# Patient Record
Sex: Male | Born: 1940 | Race: White | Hispanic: No | Marital: Married | Smoking: Former smoker
Health system: Southern US, Community
[De-identification: ages and names within clinical notes are randomized; demographics above are authoritative.]

## PROBLEM LIST (undated history)

## (undated) DIAGNOSIS — E7800 Pure hypercholesterolemia, unspecified: Secondary | ICD-10-CM

## (undated) DIAGNOSIS — I2510 Atherosclerotic heart disease of native coronary artery without angina pectoris: Secondary | ICD-10-CM

## (undated) DIAGNOSIS — K219 Gastro-esophageal reflux disease without esophagitis: Secondary | ICD-10-CM

## (undated) DIAGNOSIS — I219 Acute myocardial infarction, unspecified: Secondary | ICD-10-CM

## (undated) DIAGNOSIS — I4891 Unspecified atrial fibrillation: Secondary | ICD-10-CM

## (undated) DIAGNOSIS — E669 Obesity, unspecified: Secondary | ICD-10-CM

## (undated) DIAGNOSIS — E119 Type 2 diabetes mellitus without complications: Secondary | ICD-10-CM

## (undated) DIAGNOSIS — K5790 Diverticulosis of intestine, part unspecified, without perforation or abscess without bleeding: Secondary | ICD-10-CM

## (undated) HISTORY — DX: Acute myocardial infarction, unspecified: I21.9

## (undated) HISTORY — DX: Obesity, unspecified: E66.9

## (undated) HISTORY — DX: Gastro-esophageal reflux disease without esophagitis: K21.9

## (undated) HISTORY — DX: Diverticulosis of intestine, part unspecified, without perforation or abscess without bleeding: K57.90

## (undated) HISTORY — DX: Type 2 diabetes mellitus without complications: E11.9

## (undated) HISTORY — DX: Unspecified atrial fibrillation: I48.91

## (undated) HISTORY — DX: Atherosclerotic heart disease of native coronary artery without angina pectoris: I25.10

## (undated) HISTORY — DX: Pure hypercholesterolemia, unspecified: E78.00

## (undated) HISTORY — PX: CORONARY ARTERY BYPASS GRAFT: SHX141

---
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
## 1986-04-06 DIAGNOSIS — I219 Acute myocardial infarction, unspecified: Secondary | ICD-10-CM

## 1986-04-06 HISTORY — DX: Acute myocardial infarction, unspecified: I21.9

## 1999-01-05 ENCOUNTER — Encounter: Payer: Self-pay | Admitting: Internal Medicine

## 1999-01-05 ENCOUNTER — Encounter: Admission: RE | Admit: 1999-01-05 | Discharge: 1999-01-05 | Payer: Self-pay | Admitting: Internal Medicine

## 2005-07-21 ENCOUNTER — Encounter: Admission: RE | Admit: 2005-07-21 | Discharge: 2005-07-21 | Payer: Self-pay | Admitting: Gastroenterology

## 2010-10-21 ENCOUNTER — Inpatient Hospital Stay (HOSPITAL_BASED_OUTPATIENT_CLINIC_OR_DEPARTMENT_OTHER)
Admission: RE | Admit: 2010-10-21 | Discharge: 2010-10-21 | Disposition: A | Discharge status: Home / Self Care | Payer: Medicare Other | Source: Ambulatory Visit | Attending: Interventional Cardiology | Admitting: Interventional Cardiology

## 2010-10-21 DIAGNOSIS — I70219 Atherosclerosis of native arteries of extremities with intermittent claudication, unspecified extremity: Secondary | ICD-10-CM | POA: Insufficient documentation

## 2010-10-21 DIAGNOSIS — I2510 Atherosclerotic heart disease of native coronary artery without angina pectoris: Secondary | ICD-10-CM | POA: Insufficient documentation

## 2010-10-21 DIAGNOSIS — I209 Angina pectoris, unspecified: Secondary | ICD-10-CM | POA: Insufficient documentation

## 2010-10-24 ENCOUNTER — Encounter: Payer: Medicare Other | Admitting: Cardiothoracic Surgery

## 2010-10-25 ENCOUNTER — Institutional Professional Consult (permissible substitution) (INDEPENDENT_AMBULATORY_CARE_PROVIDER_SITE_OTHER): Payer: Medicare Other | Admitting: Cardiothoracic Surgery

## 2010-10-25 ENCOUNTER — Ambulatory Visit (HOSPITAL_COMMUNITY)
Admission: RE | Admit: 2010-10-25 | Discharge: 2010-10-25 | Disposition: A | Discharge status: Home / Self Care | Payer: Medicare Other | Source: Ambulatory Visit | Attending: Cardiothoracic Surgery | Admitting: Cardiothoracic Surgery

## 2010-10-25 ENCOUNTER — Encounter (HOSPITAL_COMMUNITY)
Admission: RE | Admit: 2010-10-25 | Discharge: 2010-10-25 | Disposition: A | Discharge status: Home / Self Care | Payer: Medicare Other | Source: Ambulatory Visit | Attending: Cardiothoracic Surgery | Admitting: Cardiothoracic Surgery

## 2010-10-25 ENCOUNTER — Encounter: Payer: Self-pay | Admitting: Cardiothoracic Surgery

## 2010-10-25 ENCOUNTER — Other Ambulatory Visit: Payer: Self-pay | Admitting: Cardiothoracic Surgery

## 2010-10-25 VITALS — BP 152/82 | HR 51 | Temp 98.0°F | Resp 18 | Ht 69.0 in | Wt 245.0 lb

## 2010-10-25 DIAGNOSIS — E785 Hyperlipidemia, unspecified: Secondary | ICD-10-CM | POA: Insufficient documentation

## 2010-10-25 DIAGNOSIS — I2510 Atherosclerotic heart disease of native coronary artery without angina pectoris: Secondary | ICD-10-CM | POA: Insufficient documentation

## 2010-10-25 DIAGNOSIS — E119 Type 2 diabetes mellitus without complications: Secondary | ICD-10-CM | POA: Insufficient documentation

## 2010-10-25 DIAGNOSIS — I480 Paroxysmal atrial fibrillation: Secondary | ICD-10-CM | POA: Insufficient documentation

## 2010-10-25 DIAGNOSIS — I252 Old myocardial infarction: Secondary | ICD-10-CM | POA: Insufficient documentation

## 2010-10-25 DIAGNOSIS — Z01810 Encounter for preprocedural cardiovascular examination: Secondary | ICD-10-CM | POA: Insufficient documentation

## 2010-10-25 DIAGNOSIS — I219 Acute myocardial infarction, unspecified: Secondary | ICD-10-CM

## 2010-10-25 DIAGNOSIS — I4891 Unspecified atrial fibrillation: Secondary | ICD-10-CM | POA: Insufficient documentation

## 2010-10-25 DIAGNOSIS — K5790 Diverticulosis of intestine, part unspecified, without perforation or abscess without bleeding: Secondary | ICD-10-CM | POA: Insufficient documentation

## 2010-10-25 DIAGNOSIS — E7800 Pure hypercholesterolemia, unspecified: Secondary | ICD-10-CM

## 2010-10-25 DIAGNOSIS — Z01812 Encounter for preprocedural laboratory examination: Secondary | ICD-10-CM | POA: Insufficient documentation

## 2010-10-25 DIAGNOSIS — K219 Gastro-esophageal reflux disease without esophagitis: Secondary | ICD-10-CM | POA: Insufficient documentation

## 2010-10-25 DIAGNOSIS — E669 Obesity, unspecified: Secondary | ICD-10-CM | POA: Insufficient documentation

## 2010-10-25 DIAGNOSIS — I10 Essential (primary) hypertension: Secondary | ICD-10-CM | POA: Insufficient documentation

## 2010-10-25 LAB — BLOOD GAS, ARTERIAL
Acid-base deficit: 0.3 mmol/L (ref 0.0–2.0)
Bicarbonate: 23.8 meq/L (ref 20.0–24.0)
Drawn by: 344351
FIO2: 0.21 %
O2 Saturation: 93.1 %
Patient temperature: 98.6
TCO2: 25 mmol/L (ref 0–100)
pCO2 arterial: 38.7 mmHg (ref 35.0–45.0)
pH, Arterial: 7.406 (ref 7.350–7.450)
pO2, Arterial: 63.1 mmHg — ABNORMAL LOW (ref 80.0–100.0)

## 2010-10-25 LAB — URINALYSIS, ROUTINE W REFLEX MICROSCOPIC
Bilirubin Urine: NEGATIVE
Glucose, UA: NEGATIVE mg/dL
Hgb urine dipstick: NEGATIVE
Ketones, ur: NEGATIVE mg/dL
Leukocytes, UA: NEGATIVE
Nitrite: NEGATIVE
Protein, ur: NEGATIVE mg/dL
Specific Gravity, Urine: 1.01 (ref 1.005–1.030)
Urobilinogen, UA: 0.2 mg/dL (ref 0.0–1.0)
pH: 5.5 (ref 5.0–8.0)

## 2010-10-25 LAB — COMPREHENSIVE METABOLIC PANEL
ALT: 30 U/L (ref 0–53)
AST: 24 U/L (ref 0–37)
Albumin: 4.5 g/dL (ref 3.5–5.2)
Alkaline Phosphatase: 67 U/L (ref 39–117)
BUN: 17 mg/dL (ref 6–23)
CO2: 27 mEq/L (ref 19–32)
Calcium: 9.9 mg/dL (ref 8.4–10.5)
Chloride: 102 mEq/L (ref 96–112)
Creatinine, Ser: 1.13 mg/dL (ref 0.50–1.35)
GFR calc Af Amer: >60 mL/min (ref >60)
GFR calc non Af Amer: >60 mL/min (ref >60)
Glucose, Bld: 120 mg/dL — ABNORMAL HIGH (ref 70–99)
Potassium: 4.2 mEq/L (ref 3.5–5.1)
Sodium: 139 mEq/L (ref 135–145)
Total Bilirubin: 0.4 mg/dL (ref 0.3–1.2)
Total Protein: 7.3 g/dL (ref 6.0–8.3)

## 2010-10-25 LAB — CBC
HCT: 42.2 % (ref 39.0–52.0)
Hemoglobin: 15.4 g/dL (ref 13.0–17.0)
MCH: 32 pg (ref 26.0–34.0)
MCHC: 36.5 g/dL — ABNORMAL HIGH (ref 30.0–36.0)
MCV: 87.7 fL (ref 78.0–100.0)
Platelets: 187 K/uL (ref 150–400)
RBC: 4.81 MIL/uL (ref 4.22–5.81)
RDW: 12.9 % (ref 11.5–15.5)
WBC: 4.8 K/uL (ref 4.0–10.5)

## 2010-10-25 LAB — TYPE AND SCREEN
ABO/RH(D): O POS
Antibody Screen: NEGATIVE

## 2010-10-25 LAB — ABO/RH: ABO/RH(D): O POS

## 2010-10-25 LAB — SURGICAL PCR SCREEN
MRSA, PCR: NEGATIVE
Staphylococcus aureus: NEGATIVE

## 2010-10-25 LAB — APTT: aPTT: 36 seconds (ref 24–37)

## 2010-10-25 LAB — PROTIME-INR
INR: 1.26 (ref 0.00–1.49)
Prothrombin Time: 16.1 s — ABNORMAL HIGH (ref 11.6–15.2)

## 2010-10-25 NOTE — H&P (Signed)
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 Verdis Prime. Patient has a history of previous myocardial infarction in 1988 which was treated medically and resulted in hypokinesia of the LV apex and inferior wall. He has done well with medical therapy until recently when he has developed exertional chest pain and shortness of breath as well as claudication of his  bilateral calf and buttock. A myocardial perfusion stress test performed by  Dr Katrinka Blazing was abnormal showing ischemia-scar in the apex and inferior wall.  A subsequent lower extremity Doppler study revealed bilateral ABI of 0.8. A left heart catheter was performed 5 days previously by Dr. Katrinka Blazing showing an 80% left main stenosis with high-grade LAD diagonal disease, high-grade stenosis of the OM one origin of the circumflex, and distal RCA stenosis with ejection fraction of 45%. Based on the patient's symptoms stress test and coronary anatomy is felt to be candidate for surgical coronary revascularization.  PAST MEDICAL HISTORY: type 2 diabetes mellitus treated with oral agent, hypertension, dyslipidemia, history of paroxysmal atrial fibrillation on Coumadin for approximately 6 months, obesity, GERD, peripheral vessel disease with claudication of bilateral lower to gravity,  SOCIAL HISTORY:  He quit smoking 5 years ago. He does not drink alcohol. He is retired and married with his wife. He is a fairly sedentary lifestyle.  MEDICATIONS: HCTZ 25 mg daily warfarin 5 mg daily metformin 500 mg daily Diovan 320 one half tablet daily felodipine 10 mg by mouth daily simvastatin 80 mg daily atenolol 25 mg daily tramadol 50 mg every 6 hours when necessary  ALLERGIES no known drug  allergies  REVIEW OF SYSTEMS  constitutional review is negative for fever or weight loss. ENT review is negative for difficulty swallowing or active dental complaint. The rest review is negative wrist or thoracic trauma or abnormal chest x-ray, pulmonary nodules, no recent symptoms of upper respiratory infection. Cardiac is positive for his coronary disease negative for history of valvular disease positive for history of myocardial infarction positive for history of transient paroxysmal atrial fibrillation now in sinus rhythm on medications and Coumadin. GI review is positive for GERD negative for hepatitis jaundice or blood per rectum positive for diverticular disease and sigmoid colon noted on previous barium and a previous positive for diabetes mellitus negative for thyroid disease, hemoglobin A1c is pending. Past review is negative for DVT positive for bilateral lower leg claudication with probable peripheral vascular disease at the ileoanal femoral level. Neurologic review is negative for history of mini stroke, TIA, a carotid Doppler ultrasound study is pending.  Marland Kitchen PHYSICAL EXAM:  VITAL SIGN space blood pressure 148/88 pulse 60 regular respirations 18 temperature afebrile GENERAL APPEARANCE a well-developed middle-aged Caucasian male accompanied by his wife in no acute distress HEENT normocephalic pupils are equal and dentition is poor with cerebral missing teeth. NECK is supple without JVD mass or carotid bruit. CHEST no tenderness no deformity clear breath sounds bilaterally HEART regular rhythm without S3 gallop murmur or friction rub. ABDOMEN soft nontender without pulsatile mass active bowel sounds no organomegaly EXTREMITIES no clubbing cyanosis edema or tenderness VASCULAR nonpalpable pedal pulses 2+ palpable radial pulses no evidence of venous insufficiency of the lower extremities feet are warm and pink despite the absence of pedal pulses. NEUROLOGIC no focal motor  weakness her and  oriented.  IMPRESSION PLAN  This is a very nice 70 year old gentleman who has exertional angina with severe coronary disease and left main stenosis documented by cardiac catheterization. He would benefit from multivessel bypass grafting targets plan to the LAD, circumflex, and distal RCA. Schedule surgery for August 23 at [facility] and obtained preoperative Doppler carotid studies and cautery function tests. Discussed the indications benefits alternatives and risks of coronary artery bypass grafting for for treatment of his coronary artery disease. He understands the risk of bleeding, potentially require, infection, ventilator dependence, renal failure, MI, stroke, and death. After reviewing these issues she demonstrated his understanding and agrees to proceed with surgery under what I feel is an informed consent.

## 2010-10-26 LAB — HEMOGLOBIN A1C
Hgb A1c MFr Bld: 6.7 % — ABNORMAL HIGH (ref <5.7)
Mean Plasma Glucose: 146 mg/dL — ABNORMAL HIGH (ref <117)

## 2010-10-27 ENCOUNTER — Inpatient Hospital Stay (HOSPITAL_COMMUNITY)
Admission: RE | Admit: 2010-10-27 | Discharge: 2010-11-04 | DRG: 235 | Disposition: A | Discharge status: Skilled Nursing Facility | Payer: Medicare Other | Source: Ambulatory Visit | Attending: Cardiothoracic Surgery | Admitting: Cardiothoracic Surgery

## 2010-10-27 ENCOUNTER — Inpatient Hospital Stay (HOSPITAL_COMMUNITY): Payer: Medicare Other

## 2010-10-27 DIAGNOSIS — I2510 Atherosclerotic heart disease of native coronary artery without angina pectoris: Principal | ICD-10-CM | POA: Diagnosis present

## 2010-10-27 DIAGNOSIS — I4891 Unspecified atrial fibrillation: Secondary | ICD-10-CM | POA: Diagnosis not present

## 2010-10-27 DIAGNOSIS — I252 Old myocardial infarction: Secondary | ICD-10-CM

## 2010-10-27 DIAGNOSIS — I509 Heart failure, unspecified: Secondary | ICD-10-CM | POA: Diagnosis present

## 2010-10-27 DIAGNOSIS — Z7982 Long term (current) use of aspirin: Secondary | ICD-10-CM

## 2010-10-27 DIAGNOSIS — Z87891 Personal history of nicotine dependence: Secondary | ICD-10-CM

## 2010-10-27 DIAGNOSIS — I5043 Acute on chronic combined systolic (congestive) and diastolic (congestive) heart failure: Secondary | ICD-10-CM | POA: Diagnosis present

## 2010-10-27 DIAGNOSIS — E785 Hyperlipidemia, unspecified: Secondary | ICD-10-CM | POA: Diagnosis present

## 2010-10-27 DIAGNOSIS — I10 Essential (primary) hypertension: Secondary | ICD-10-CM | POA: Diagnosis present

## 2010-10-27 DIAGNOSIS — E8779 Other fluid overload: Secondary | ICD-10-CM | POA: Diagnosis not present

## 2010-10-27 DIAGNOSIS — Z7901 Long term (current) use of anticoagulants: Secondary | ICD-10-CM

## 2010-10-27 DIAGNOSIS — Z79899 Other long term (current) drug therapy: Secondary | ICD-10-CM

## 2010-10-27 DIAGNOSIS — E119 Type 2 diabetes mellitus without complications: Secondary | ICD-10-CM | POA: Diagnosis present

## 2010-10-27 DIAGNOSIS — I495 Sick sinus syndrome: Secondary | ICD-10-CM | POA: Diagnosis not present

## 2010-10-27 DIAGNOSIS — I739 Peripheral vascular disease, unspecified: Secondary | ICD-10-CM | POA: Diagnosis present

## 2010-10-27 DIAGNOSIS — D62 Acute posthemorrhagic anemia: Secondary | ICD-10-CM | POA: Diagnosis not present

## 2010-10-27 HISTORY — PX: OTHER SURGICAL HISTORY: SHX169

## 2010-10-27 LAB — CREATININE, SERUM
Creatinine, Ser: 0.76 mg/dL (ref 0.50–1.35)
GFR calc Af Amer: >60 mL/min (ref >60)
GFR calc non Af Amer: >60 mL/min (ref >60)

## 2010-10-27 LAB — POCT I-STAT 3, ART BLOOD GAS (G3+)
Acid-base deficit: 2 mmol/L (ref 0.0–2.0)
Acid-base deficit: 3 mmol/L — ABNORMAL HIGH (ref 0.0–2.0)
Acid-base deficit: 3 mmol/L — ABNORMAL HIGH (ref 0.0–2.0)
Acid-base deficit: 2 mmol/L (ref 0.0–2.0)
Acid-base deficit: 3 mmol/L — ABNORMAL HIGH (ref 0.0–2.0)
Bicarbonate: 23.2 mEq/L (ref 20.0–24.0)
Bicarbonate: 21.7 mEq/L (ref 20.0–24.0)
Bicarbonate: 22.9 mEq/L (ref 20.0–24.0)
Bicarbonate: 23.9 mEq/L (ref 20.0–24.0)
Bicarbonate: 23.1 mEq/L (ref 20.0–24.0)
O2 Saturation: 100 %
O2 Saturation: 100 %
O2 Saturation: 92 %
O2 Saturation: 94 %
O2 Saturation: 91 %
Patient temperature: 35.4
Patient temperature: 37
Patient temperature: 37.7
TCO2: 24 mmol/L (ref 0–100)
TCO2: 23 mmol/L (ref 0–100)
TCO2: 24 mmol/L (ref 0–100)
TCO2: 25 mmol/L (ref 0–100)
TCO2: 24 mmol/L (ref 0–100)
pCO2 arterial: 41.9 mmHg (ref 35.0–45.0)
pCO2 arterial: 37.4 mmHg (ref 35.0–45.0)
pCO2 arterial: 41.7 mmHg (ref 35.0–45.0)
pCO2 arterial: 45.7 mmHg — ABNORMAL HIGH (ref 35.0–45.0)
pCO2 arterial: 43.6 mmHg (ref 35.0–45.0)
pH, Arterial: 7.352 (ref 7.350–7.450)
pH, Arterial: 7.372 (ref 7.350–7.450)
pH, Arterial: 7.34 — ABNORMAL LOW (ref 7.350–7.450)
pH, Arterial: 7.326 — ABNORMAL LOW (ref 7.350–7.450)
pH, Arterial: 7.336 — ABNORMAL LOW (ref 7.350–7.450)
pO2, Arterial: 342 mmHg — ABNORMAL HIGH (ref 80.0–100.0)
pO2, Arterial: 250 mmHg — ABNORMAL HIGH (ref 80.0–100.0)
pO2, Arterial: 62 mmHg — ABNORMAL LOW (ref 80.0–100.0)
pO2, Arterial: 79 mmHg — ABNORMAL LOW (ref 80.0–100.0)
pO2, Arterial: 68 mmHg — ABNORMAL LOW (ref 80.0–100.0)

## 2010-10-27 LAB — POCT I-STAT 4, (NA,K, GLUC, HGB,HCT)
Glucose, Bld: 148 mg/dL — ABNORMAL HIGH (ref 70–99)
Glucose, Bld: 146 mg/dL — ABNORMAL HIGH (ref 70–99)
Glucose, Bld: 161 mg/dL — ABNORMAL HIGH (ref 70–99)
Glucose, Bld: 144 mg/dL — ABNORMAL HIGH (ref 70–99)
Glucose, Bld: 175 mg/dL — ABNORMAL HIGH (ref 70–99)
Glucose, Bld: 133 mg/dL — ABNORMAL HIGH (ref 70–99)
HCT: 40 % (ref 39.0–52.0)
HCT: 27 % — ABNORMAL LOW (ref 39.0–52.0)
HCT: 35 % — ABNORMAL LOW (ref 39.0–52.0)
HCT: 28 % — ABNORMAL LOW (ref 39.0–52.0)
HCT: 32 % — ABNORMAL LOW (ref 39.0–52.0)
HCT: 33 % — ABNORMAL LOW (ref 39.0–52.0)
Hemoglobin: 13.6 g/dL (ref 13.0–17.0)
Hemoglobin: 11.9 g/dL — ABNORMAL LOW (ref 13.0–17.0)
Hemoglobin: 9.2 g/dL — ABNORMAL LOW (ref 13.0–17.0)
Hemoglobin: 9.5 g/dL — ABNORMAL LOW (ref 13.0–17.0)
Hemoglobin: 10.9 g/dL — ABNORMAL LOW (ref 13.0–17.0)
Hemoglobin: 11.2 g/dL — ABNORMAL LOW (ref 13.0–17.0)
Potassium: 3.8 mEq/L (ref 3.5–5.1)
Potassium: 3.6 mEq/L (ref 3.5–5.1)
Potassium: 3.7 mEq/L (ref 3.5–5.1)
Potassium: 4.4 mEq/L (ref 3.5–5.1)
Potassium: 3.9 mEq/L (ref 3.5–5.1)
Potassium: 3.4 mEq/L — ABNORMAL LOW (ref 3.5–5.1)
Sodium: 140 mEq/L (ref 135–145)
Sodium: 136 mEq/L (ref 135–145)
Sodium: 140 mEq/L (ref 135–145)
Sodium: 137 mEq/L (ref 135–145)
Sodium: 139 mEq/L (ref 135–145)
Sodium: 139 mEq/L (ref 135–145)

## 2010-10-27 LAB — CBC
HCT: 32.6 % — ABNORMAL LOW (ref 39.0–52.0)
HCT: 34.8 % — ABNORMAL LOW (ref 39.0–52.0)
Hemoglobin: 11.4 g/dL — ABNORMAL LOW (ref 13.0–17.0)
Hemoglobin: 12.3 g/dL — ABNORMAL LOW (ref 13.0–17.0)
MCH: 30.3 pg (ref 26.0–34.0)
MCH: 30.6 pg (ref 26.0–34.0)
MCHC: 35 g/dL (ref 30.0–36.0)
MCHC: 35.3 g/dL (ref 30.0–36.0)
MCV: 86.7 fL (ref 78.0–100.0)
MCV: 86.6 fL (ref 78.0–100.0)
Platelets: 116 10*3/uL — ABNORMAL LOW (ref 150–400)
Platelets: 137 10*3/uL — ABNORMAL LOW (ref 150–400)
RBC: 3.76 MIL/uL — ABNORMAL LOW (ref 4.22–5.81)
RBC: 4.02 MIL/uL — ABNORMAL LOW (ref 4.22–5.81)
RDW: 12.8 % (ref 11.5–15.5)
RDW: 12.8 % (ref 11.5–15.5)
WBC: 9.9 10*3/uL (ref 4.0–10.5)
WBC: 9.2 10*3/uL (ref 4.0–10.5)

## 2010-10-27 LAB — POCT I-STAT, CHEM 8
BUN: 12 mg/dL (ref 6–23)
Calcium, Ion: 1.13 mmol/L (ref 1.12–1.32)
Chloride: 106 mEq/L (ref 96–112)
Creatinine, Ser: 0.9 mg/dL (ref 0.50–1.35)
Glucose, Bld: 126 mg/dL — ABNORMAL HIGH (ref 70–99)
HCT: 36 % — ABNORMAL LOW (ref 39.0–52.0)
Hemoglobin: 12.2 g/dL — ABNORMAL LOW (ref 13.0–17.0)
Potassium: 4.3 mEq/L (ref 3.5–5.1)
Sodium: 141 mEq/L (ref 135–145)
TCO2: 22 mmol/L (ref 0–100)

## 2010-10-27 LAB — POCT ACTIVATED CLOTTING TIME
Activated Clotting Time: 149 seconds
Activated Clotting Time: 507 seconds
Activated Clotting Time: 375 seconds
Activated Clotting Time: 133 seconds

## 2010-10-27 LAB — APTT: aPTT: 38 seconds — ABNORMAL HIGH (ref 24–37)

## 2010-10-27 LAB — HEMOGLOBIN AND HEMATOCRIT, BLOOD
HCT: 28 % — ABNORMAL LOW (ref 39.0–52.0)
Hemoglobin: 9.8 g/dL — ABNORMAL LOW (ref 13.0–17.0)

## 2010-10-27 LAB — GLUCOSE, CAPILLARY: Glucose-Capillary: 160 mg/dL — ABNORMAL HIGH (ref 70–99)

## 2010-10-27 LAB — PROTIME-INR
INR: 1.66 — ABNORMAL HIGH (ref 0.00–1.49)
Prothrombin Time: 19.9 seconds — ABNORMAL HIGH (ref 11.6–15.2)

## 2010-10-27 LAB — MAGNESIUM: Magnesium: 2.6 mg/dL — ABNORMAL HIGH (ref 1.5–2.5)

## 2010-10-27 LAB — PLATELET COUNT: Platelets: 114 10*3/uL — ABNORMAL LOW (ref 150–400)

## 2010-10-28 ENCOUNTER — Inpatient Hospital Stay (HOSPITAL_COMMUNITY): Payer: Medicare Other

## 2010-10-28 DIAGNOSIS — E1165 Type 2 diabetes mellitus with hyperglycemia: Secondary | ICD-10-CM

## 2010-10-28 DIAGNOSIS — IMO0001 Reserved for inherently not codable concepts without codable children: Secondary | ICD-10-CM

## 2010-10-28 LAB — GLUCOSE, CAPILLARY
Glucose-Capillary: 137 mg/dL — ABNORMAL HIGH (ref 70–99)
Glucose-Capillary: 147 mg/dL — ABNORMAL HIGH (ref 70–99)
Glucose-Capillary: 116 mg/dL — ABNORMAL HIGH (ref 70–99)
Glucose-Capillary: 100 mg/dL — ABNORMAL HIGH (ref 70–99)
Glucose-Capillary: 101 mg/dL — ABNORMAL HIGH (ref 70–99)
Glucose-Capillary: 104 mg/dL — ABNORMAL HIGH (ref 70–99)
Glucose-Capillary: 104 mg/dL — ABNORMAL HIGH (ref 70–99)
Glucose-Capillary: 108 mg/dL — ABNORMAL HIGH (ref 70–99)
Glucose-Capillary: 111 mg/dL — ABNORMAL HIGH (ref 70–99)
Glucose-Capillary: 120 mg/dL — ABNORMAL HIGH (ref 70–99)
Glucose-Capillary: 129 mg/dL — ABNORMAL HIGH (ref 70–99)
Glucose-Capillary: 131 mg/dL — ABNORMAL HIGH (ref 70–99)
Glucose-Capillary: 142 mg/dL — ABNORMAL HIGH (ref 70–99)
Glucose-Capillary: 83 mg/dL (ref 70–99)
Glucose-Capillary: 85 mg/dL (ref 70–99)
Glucose-Capillary: 93 mg/dL (ref 70–99)
Glucose-Capillary: 149 mg/dL — ABNORMAL HIGH (ref 70–99)
Glucose-Capillary: 143 mg/dL — ABNORMAL HIGH (ref 70–99)
Glucose-Capillary: 128 mg/dL — ABNORMAL HIGH (ref 70–99)
Glucose-Capillary: 128 mg/dL — ABNORMAL HIGH (ref 70–99)
Glucose-Capillary: 137 mg/dL — ABNORMAL HIGH (ref 70–99)

## 2010-10-28 LAB — BASIC METABOLIC PANEL
BUN: 14 mg/dL (ref 6–23)
CO2: 25 mEq/L (ref 19–32)
Calcium: 8.2 mg/dL — ABNORMAL LOW (ref 8.4–10.5)
Chloride: 109 mEq/L (ref 96–112)
Creatinine, Ser: 0.9 mg/dL (ref 0.50–1.35)
GFR calc Af Amer: >60 mL/min (ref >60)
GFR calc non Af Amer: >60 mL/min (ref >60)
Glucose, Bld: 105 mg/dL — ABNORMAL HIGH (ref 70–99)
Potassium: 3.9 mEq/L (ref 3.5–5.1)
Sodium: 140 mEq/L (ref 135–145)

## 2010-10-28 LAB — CREATININE, SERUM
Creatinine, Ser: 1.32 mg/dL (ref 0.50–1.35)
GFR calc Af Amer: >60 mL/min (ref >60)
GFR calc non Af Amer: 53 mL/min — ABNORMAL LOW (ref >60)

## 2010-10-28 LAB — CBC
HCT: 34.3 % — ABNORMAL LOW (ref 39.0–52.0)
HCT: 35.2 % — ABNORMAL LOW (ref 39.0–52.0)
Hemoglobin: 12 g/dL — ABNORMAL LOW (ref 13.0–17.0)
Hemoglobin: 12 g/dL — ABNORMAL LOW (ref 13.0–17.0)
MCH: 30.5 pg (ref 26.0–34.0)
MCH: 30.4 pg (ref 26.0–34.0)
MCHC: 35 g/dL (ref 30.0–36.0)
MCHC: 34.1 g/dL (ref 30.0–36.0)
MCV: 87.3 fL (ref 78.0–100.0)
MCV: 89.1 fL (ref 78.0–100.0)
Platelets: 133 10*3/uL — ABNORMAL LOW (ref 150–400)
Platelets: 150 10*3/uL (ref 150–400)
RBC: 3.93 MIL/uL — ABNORMAL LOW (ref 4.22–5.81)
RBC: 3.95 MIL/uL — ABNORMAL LOW (ref 4.22–5.81)
RDW: 13 % (ref 11.5–15.5)
RDW: 13.3 % (ref 11.5–15.5)
WBC: 9.8 10*3/uL (ref 4.0–10.5)
WBC: 11.8 10*3/uL — ABNORMAL HIGH (ref 4.0–10.5)

## 2010-10-28 LAB — MAGNESIUM
Magnesium: 2.5 mg/dL (ref 1.5–2.5)
Magnesium: 2.5 mg/dL (ref 1.5–2.5)

## 2010-10-28 LAB — POCT I-STAT, CHEM 8
BUN: 21 mg/dL (ref 6–23)
Calcium, Ion: 1.12 mmol/L (ref 1.12–1.32)
Chloride: 102 mEq/L (ref 96–112)
Creatinine, Ser: 1.5 mg/dL — ABNORMAL HIGH (ref 0.50–1.35)
Glucose, Bld: 168 mg/dL — ABNORMAL HIGH (ref 70–99)
HCT: 36 % — ABNORMAL LOW (ref 39.0–52.0)
Hemoglobin: 12.2 g/dL — ABNORMAL LOW (ref 13.0–17.0)
Potassium: 4.2 mEq/L (ref 3.5–5.1)
Sodium: 138 mEq/L (ref 135–145)
TCO2: 22 mmol/L (ref 0–100)

## 2010-10-29 ENCOUNTER — Inpatient Hospital Stay (HOSPITAL_COMMUNITY): Payer: Medicare Other

## 2010-10-29 LAB — GLUCOSE, CAPILLARY
Glucose-Capillary: 147 mg/dL — ABNORMAL HIGH (ref 70–99)
Glucose-Capillary: 160 mg/dL — ABNORMAL HIGH (ref 70–99)
Glucose-Capillary: 166 mg/dL — ABNORMAL HIGH (ref 70–99)
Glucose-Capillary: 166 mg/dL — ABNORMAL HIGH (ref 70–99)
Glucose-Capillary: 181 mg/dL — ABNORMAL HIGH (ref 70–99)
Glucose-Capillary: 208 mg/dL — ABNORMAL HIGH (ref 70–99)

## 2010-10-29 LAB — BASIC METABOLIC PANEL
BUN: 21 mg/dL (ref 6–23)
CO2: 25 mEq/L (ref 19–32)
Calcium: 8.2 mg/dL — ABNORMAL LOW (ref 8.4–10.5)
Chloride: 100 mEq/L (ref 96–112)
Creatinine, Ser: 1.13 mg/dL (ref 0.50–1.35)
GFR calc Af Amer: >60 mL/min (ref >60)
GFR calc non Af Amer: >60 mL/min (ref >60)
Glucose, Bld: 177 mg/dL — ABNORMAL HIGH (ref 70–99)
Potassium: 4.1 mEq/L (ref 3.5–5.1)
Sodium: 138 mEq/L (ref 135–145)

## 2010-10-29 LAB — CBC
HCT: 34.5 % — ABNORMAL LOW (ref 39.0–52.0)
Hemoglobin: 11.7 g/dL — ABNORMAL LOW (ref 13.0–17.0)
MCH: 30.2 pg (ref 26.0–34.0)
MCHC: 33.9 g/dL (ref 30.0–36.0)
MCV: 89.1 fL (ref 78.0–100.0)
Platelets: 140 10*3/uL — ABNORMAL LOW (ref 150–400)
RBC: 3.87 MIL/uL — ABNORMAL LOW (ref 4.22–5.81)
RDW: 13.4 % (ref 11.5–15.5)
WBC: 10.7 10*3/uL — ABNORMAL HIGH (ref 4.0–10.5)

## 2010-10-29 LAB — HEPATIC FUNCTION PANEL
ALT: 17 U/L (ref 0–53)
AST: 28 U/L (ref 0–37)
Albumin: 3.3 g/dL — ABNORMAL LOW (ref 3.5–5.2)
Alkaline Phosphatase: 44 U/L (ref 39–117)
Bilirubin, Direct: 0.2 mg/dL (ref 0.0–0.3)
Indirect Bilirubin: 0.5 mg/dL (ref 0.3–0.9)
Total Bilirubin: 0.7 mg/dL (ref 0.3–1.2)
Total Protein: 6 g/dL (ref 6.0–8.3)

## 2010-10-29 LAB — PROTIME-INR
INR: 1.31 (ref 0.00–1.49)
Prothrombin Time: 16.5 seconds — ABNORMAL HIGH (ref 11.6–15.2)

## 2010-10-29 LAB — TSH: TSH: 2.144 u[IU]/mL (ref 0.350–4.500)

## 2010-10-29 LAB — MAGNESIUM: Magnesium: 2.2 mg/dL (ref 1.5–2.5)

## 2010-10-30 ENCOUNTER — Inpatient Hospital Stay (HOSPITAL_COMMUNITY): Payer: Medicare Other

## 2010-10-30 LAB — CBC
HCT: 32.6 % — ABNORMAL LOW (ref 39.0–52.0)
Hemoglobin: 11.1 g/dL — ABNORMAL LOW (ref 13.0–17.0)
MCH: 29.7 pg (ref 26.0–34.0)
MCHC: 34 g/dL (ref 30.0–36.0)
MCV: 87.2 fL (ref 78.0–100.0)
Platelets: 147 10*3/uL — ABNORMAL LOW (ref 150–400)
RBC: 3.74 MIL/uL — ABNORMAL LOW (ref 4.22–5.81)
RDW: 13.1 % (ref 11.5–15.5)
WBC: 8.9 10*3/uL (ref 4.0–10.5)

## 2010-10-30 LAB — GLUCOSE, CAPILLARY
Glucose-Capillary: 150 mg/dL — ABNORMAL HIGH (ref 70–99)
Glucose-Capillary: 151 mg/dL — ABNORMAL HIGH (ref 70–99)
Glucose-Capillary: 167 mg/dL — ABNORMAL HIGH (ref 70–99)
Glucose-Capillary: 179 mg/dL — ABNORMAL HIGH (ref 70–99)
Glucose-Capillary: 188 mg/dL — ABNORMAL HIGH (ref 70–99)

## 2010-10-30 LAB — BASIC METABOLIC PANEL
BUN: 24 mg/dL — ABNORMAL HIGH (ref 6–23)
CO2: 26 mEq/L (ref 19–32)
Calcium: 8.5 mg/dL (ref 8.4–10.5)
Chloride: 99 mEq/L (ref 96–112)
Creatinine, Ser: 1.01 mg/dL (ref 0.50–1.35)
GFR calc Af Amer: >60 mL/min (ref >60)
GFR calc non Af Amer: >60 mL/min (ref >60)
Glucose, Bld: 159 mg/dL — ABNORMAL HIGH (ref 70–99)
Potassium: 3.9 mEq/L (ref 3.5–5.1)
Sodium: 135 mEq/L (ref 135–145)

## 2010-10-31 ENCOUNTER — Inpatient Hospital Stay (HOSPITAL_COMMUNITY): Payer: Medicare Other

## 2010-10-31 DIAGNOSIS — E1165 Type 2 diabetes mellitus with hyperglycemia: Secondary | ICD-10-CM

## 2010-10-31 DIAGNOSIS — IMO0001 Reserved for inherently not codable concepts without codable children: Secondary | ICD-10-CM

## 2010-10-31 LAB — BASIC METABOLIC PANEL
BUN: 38 mg/dL — ABNORMAL HIGH (ref 6–23)
CO2: 26 mEq/L (ref 19–32)
Calcium: 8.6 mg/dL (ref 8.4–10.5)
Chloride: 97 mEq/L (ref 96–112)
Creatinine, Ser: 1.28 mg/dL (ref 0.50–1.35)
GFR calc Af Amer: >60 mL/min (ref >60)
GFR calc non Af Amer: 55 mL/min — ABNORMAL LOW (ref >60)
Glucose, Bld: 128 mg/dL — ABNORMAL HIGH (ref 70–99)
Potassium: 3.9 mEq/L (ref 3.5–5.1)
Sodium: 132 mEq/L — ABNORMAL LOW (ref 135–145)

## 2010-10-31 LAB — CBC
HCT: 33.3 % — ABNORMAL LOW (ref 39.0–52.0)
Hemoglobin: 11.4 g/dL — ABNORMAL LOW (ref 13.0–17.0)
MCH: 30.1 pg (ref 26.0–34.0)
MCHC: 34.2 g/dL (ref 30.0–36.0)
MCV: 87.9 fL (ref 78.0–100.0)
Platelets: 199 10*3/uL (ref 150–400)
RBC: 3.79 MIL/uL — ABNORMAL LOW (ref 4.22–5.81)
RDW: 13.3 % (ref 11.5–15.5)
WBC: 8.8 10*3/uL (ref 4.0–10.5)

## 2010-10-31 LAB — GLUCOSE, CAPILLARY
Glucose-Capillary: 137 mg/dL — ABNORMAL HIGH (ref 70–99)
Glucose-Capillary: 150 mg/dL — ABNORMAL HIGH (ref 70–99)
Glucose-Capillary: 168 mg/dL — ABNORMAL HIGH (ref 70–99)
Glucose-Capillary: 138 mg/dL — ABNORMAL HIGH (ref 70–99)
Glucose-Capillary: 169 mg/dL — ABNORMAL HIGH (ref 70–99)
Glucose-Capillary: 137 mg/dL — ABNORMAL HIGH (ref 70–99)
Glucose-Capillary: 142 mg/dL — ABNORMAL HIGH (ref 70–99)

## 2010-10-31 LAB — PROTIME-INR
INR: 1.19 (ref 0.00–1.49)
Prothrombin Time: 15.4 seconds — ABNORMAL HIGH (ref 11.6–15.2)

## 2010-10-31 NOTE — Op Note (Signed)
  NAME:  CUNG, MASTERSON NO.:  [id number]  MEDICAL RECORD NO.:  [id number]  LOCATION:  2303                         FACILITY:  [facility]  PHYSICIAN:  Bedelia Person, M.D.        DATE OF BIRTH:  03/30/1940  DATE OF PROCEDURE:  10/27/2010 DATE OF DISCHARGE:                              OPERATIVE REPORT   PROCEDURE:  Intraoperative transesophageal echocardiography.  SURGEON:  Bedelia Person, MD  INDICATIONS:  Mr. Denner is a 70 year old with significant coronary artery disease including left main disease.  He has had a prior myocardial infarction in the remote past.  He has no history of esophageal or gastric pathology.  The transesophageal transducer will be used intraoperatively to assess valvular and left ventricular function during his upcoming coronary artery bypass grafting.  DESCRIPTION OF PROCEDURE:  After induction with general anesthesia, the airway was secured with an oral endotracheal tube and orogastric tube was placed to empty gastric contents and then removed.  The 3-D transesophageal transducer was heavily lubricated and placed blindly down the oropharynx, meeting no significant resistance.  It remained at the 45-50 cm mark throughout the procedure and was removed at the completion of the case without evidence of oral or pharyngeal damage.  FINDINGS:  The prebypass examination revealed the left ventricle to be concentrically thickened.  There was apical inferior hypokinesis and akinetic segments.  The remainder of the left ventricular function was good.  The left atrium was normal in size.  The appendage was clean. The septum was intact.  The mitral valve had anterior and posterior leaflets coapted in the valvular plane.  There were no masses or calcifications noted.  Color Doppler revealed a wisp of central mitral insufficiency.  The aortic valve had 3 leaflets.  Again, no masses or calcifications detected.  All 3 leaflets appeared to be opening  and closing appropriately.  Color Doppler revealed no aortic insufficiency. The aorta was mildly dilated post valve.  The right heart exam was essentially normal with a Swan-Ganz catheter noted in the right atrium and right ventricle.  The tricuspid valve was normal in appearance. Color Doppler did reveal a trace insufficiency with the Swan-Ganz catheter again noted across the valve.  The patient was placed on cardiopulmonary bypass and underwent coronary artery bypass grafting at the completion of procedure.  Inotropic and dopamine support was added. The postbypass examination was essentially unchanged from the prebypass examination except for a more dynamic left ventricular function overall with the continued apical inferior apical akinesis as previously noted. There were no other new findings.          ______________________________ Bedelia Person, M.D.    LK/MEDQ  D:  10/27/2010  T:  10/27/2010  Job:  [id number]  Electronically Signed by Bedelia Person M.D. on 10/31/2010 10:41:05 AM

## 2010-11-01 ENCOUNTER — Inpatient Hospital Stay (HOSPITAL_COMMUNITY): Payer: Medicare Other

## 2010-11-01 DIAGNOSIS — IMO0001 Reserved for inherently not codable concepts without codable children: Secondary | ICD-10-CM

## 2010-11-01 DIAGNOSIS — E1165 Type 2 diabetes mellitus with hyperglycemia: Secondary | ICD-10-CM

## 2010-11-01 LAB — GLUCOSE, CAPILLARY
Glucose-Capillary: 133 mg/dL — ABNORMAL HIGH (ref 70–99)
Glucose-Capillary: 134 mg/dL — ABNORMAL HIGH (ref 70–99)
Glucose-Capillary: 90 mg/dL (ref 70–99)
Glucose-Capillary: 93 mg/dL (ref 70–99)

## 2010-11-01 LAB — BASIC METABOLIC PANEL
BUN: 47 mg/dL — ABNORMAL HIGH (ref 6–23)
CO2: 26 mEq/L (ref 19–32)
Calcium: 8.6 mg/dL (ref 8.4–10.5)
Chloride: 97 mEq/L (ref 96–112)
Creatinine, Ser: 1.32 mg/dL (ref 0.50–1.35)
GFR calc Af Amer: >60 mL/min (ref >60)
GFR calc non Af Amer: 53 mL/min — ABNORMAL LOW (ref >60)
Glucose, Bld: 108 mg/dL — ABNORMAL HIGH (ref 70–99)
Potassium: 3.9 mEq/L (ref 3.5–5.1)
Sodium: 133 mEq/L — ABNORMAL LOW (ref 135–145)

## 2010-11-01 LAB — CBC
HCT: 30.8 % — ABNORMAL LOW (ref 39.0–52.0)
Hemoglobin: 10.5 g/dL — ABNORMAL LOW (ref 13.0–17.0)
MCH: 30.2 pg (ref 26.0–34.0)
MCHC: 34.1 g/dL (ref 30.0–36.0)
MCV: 88.5 fL (ref 78.0–100.0)
Platelets: 217 10*3/uL (ref 150–400)
RBC: 3.48 MIL/uL — ABNORMAL LOW (ref 4.22–5.81)
RDW: 13.3 % (ref 11.5–15.5)
WBC: 6.3 10*3/uL (ref 4.0–10.5)

## 2010-11-01 LAB — PROTIME-INR
INR: 1.32 (ref 0.00–1.49)
Prothrombin Time: 16.6 seconds — ABNORMAL HIGH (ref 11.6–15.2)

## 2010-11-02 DIAGNOSIS — E1165 Type 2 diabetes mellitus with hyperglycemia: Secondary | ICD-10-CM

## 2010-11-02 DIAGNOSIS — IMO0001 Reserved for inherently not codable concepts without codable children: Secondary | ICD-10-CM

## 2010-11-02 LAB — CBC
HCT: 32.9 % — ABNORMAL LOW (ref 39.0–52.0)
Hemoglobin: 11.2 g/dL — ABNORMAL LOW (ref 13.0–17.0)
MCH: 29.9 pg (ref 26.0–34.0)
MCHC: 34 g/dL (ref 30.0–36.0)
MCV: 88 fL (ref 78.0–100.0)
Platelets: 303 10*3/uL (ref 150–400)
RBC: 3.74 MIL/uL — ABNORMAL LOW (ref 4.22–5.81)
RDW: 13.3 % (ref 11.5–15.5)
WBC: 6.9 10*3/uL (ref 4.0–10.5)

## 2010-11-02 LAB — GLUCOSE, CAPILLARY
Glucose-Capillary: 103 mg/dL — ABNORMAL HIGH (ref 70–99)
Glucose-Capillary: 110 mg/dL — ABNORMAL HIGH (ref 70–99)
Glucose-Capillary: 121 mg/dL — ABNORMAL HIGH (ref 70–99)
Glucose-Capillary: 135 mg/dL — ABNORMAL HIGH (ref 70–99)
Glucose-Capillary: 85 mg/dL (ref 70–99)

## 2010-11-02 LAB — BASIC METABOLIC PANEL
BUN: 35 mg/dL — ABNORMAL HIGH (ref 6–23)
CO2: 26 mEq/L (ref 19–32)
Calcium: 8.6 mg/dL (ref 8.4–10.5)
Chloride: 98 mEq/L (ref 96–112)
Creatinine, Ser: 1.1 mg/dL (ref 0.50–1.35)
GFR calc Af Amer: >60 mL/min (ref >60)
GFR calc non Af Amer: >60 mL/min (ref >60)
Glucose, Bld: 101 mg/dL — ABNORMAL HIGH (ref 70–99)
Potassium: 4 mEq/L (ref 3.5–5.1)
Sodium: 134 mEq/L — ABNORMAL LOW (ref 135–145)

## 2010-11-02 LAB — PROTIME-INR
INR: 1.47 (ref 0.00–1.49)
Prothrombin Time: 18.1 seconds — ABNORMAL HIGH (ref 11.6–15.2)

## 2010-11-02 NOTE — Op Note (Signed)
NAME:  Albert Short, PASSOW NO.:  [id number]  MEDICAL RECORD NO.:  [id number]  LOCATION:  2303                         FACILITY:  [facility]  PHYSICIAN:  Kerin Perna, M.D.  DATE OF BIRTH:  1940-10-02  DATE OF PROCEDURE:  10/27/2010 DATE OF DISCHARGE:                              OPERATIVE REPORT   OPERATION: 1. Coronary artery bypass grafting x4 (left internal mammary artery     LAD, saphenous vein graft to diagonal, saphenous vein graft to     circumflex marginal, saphenous vein graft to posterior descending). 2. Endoscopic harvest of right leg greater saphenous vein.  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  POSTOPERATIVE DIAGNOSIS:  Class III angina with severe left main and three-vessel disease.  SURGEON:  Kerin Perna, MD.  ASSISTANT:  Stephanie Acre Dasovich, PA.  ANESTHESIA:  General by Dr. Gypsy Balsam.  INDICATIONS:  The patient is a 70 year old diabetic who had an MI in 1988, treated medically.  He returned with symptoms of angina and a positive stress test.  Cardiac catheterization by Dr. Verdis Prime demonstrated left main and three-vessel disease with moderate LV dysfunction by echo.  He is felt to be candidate for surgical coronary revascularization.  Prior to surgery, I examined the patient in the office and reviewed results of the cardiac cath with the patient and family.  I discussed the indications and expected benefits of coronary artery bypass grafting for treatment of his coronary artery disease.  I discussed the major issues of surgery including the location of the surgical incisions, the use of general anesthesia and cardiopulmonary bypass, and expected postoperative hospital recovery.  I discussed with the patient the risks to him of coronary artery bypass surgery including the risks of stroke, bleeding, blood transfusion, wound infection, MI, and death.  After reviewing these issues, he  demonstrated his understanding and agreed to proceed with the surgery under what I felt was an informed consent.  OPERATIVE FINDINGS: 1. Calcified disease target, but adequate for grafts. 2. Small vein conduit, but adequate. 3. No blood products required.  DESCRIPTION OF PROCEDURE:  The patient was brought to the operating room, placed supine on the operating table where general anesthesia was induced under invasive hemodynamic monitoring.  The chest, abdomen, and legs were prepped with Betadine and draped as a sterile field.  A sternal incision was made as the saphenous vein was harvested endoscopically from the right leg.  The left internal mammary artery was harvested as a pedicle graft from its origin at the subclavian vessels and was a good vessel with excellent flow.  Heparin was administered and ACT was documented as being therapeutic.  The sternal retractor was placed and the pericardium was opened and suspended.  Pursestrings were placed in the ascending aorta and right atrium.  The patient was cannulated and placed on cardiopulmonary bypass.  The coronary targets were identified for grafting and the mammary artery and vein grafts were prepared for the distal anastomoses.  Cardioplegia catheters were placed for both antegrade aortic and retrograde coronary sinus cardioplegia. The patient was cooled to 32 degrees and aortic crossclamp was applied. 800 mL of cold blood cardioplegia was  delivered in split doses between the aortic and coronary sinus catheters and there is good cardioplegic arrest.  The distal coronary anastomoses was then performed.  The first distal anastomosis was of the posterior descending.  This was calcified proximally and had a 80% stenosis.  A reverse saphenous vein was sewn end-to-side with running 7-0 Prolene with good flow through the graft. The second distal anastomosis was the circumflex marginal.  This was a larger 1.7-mm vessel with proximal 70%  stenosis.  Reverse saphenous vein of somewhat small caliber was sewn end-to-side with running 7-0 Prolene with good flow through the graft.  Cardioplegia was redosed.  The third distal anastomosis was the diagonal branch of the LAD.  This is a small 1.3-mm vessel with proximal 80% calcified stenosis.  Reverse saphenous vein was sewn end-to-side with running 7-0 Prolene with good flow through the graft.  The final distal anastomosis was to the distal LAD which was diffusely diseased proximally but had a good location for the anastomosis.  This is a proximal 80% stenosis.  The left internal mammary artery was brought through an opening created in the lateral pericardium, was brought down to the LAD and sewn end-to-side with running 8-0 Prolene.  There is good flow through the anastomosis after briefly releasing the bulldog vascular clamp on the mammary pedicle. The bulldog was reapplied and the pedicle was secured to epicardium was 6-0 Prolene.  Cardioplegia was redosed.  While the crossclamp was still in place, three proximal vein anastomoses were placed on the ascending aorta using a running 7-0 Prolene.  Prior to tying down the final proximal anastomosis, air was vented from the coronaries with a dose of retrograde warm blood cardioplegia.  Cross clamp was then removed.  The heart was cardioverted back to a regular rhythm.  The vein grafts were opened and each had good flow.  Hemostasis was documented at the proximal distal anastomoses.  The patient was rewarmed and reperfused. The cardioplegia catheters were removed.  When the patient was rewarmed, the lungs re-expanded and ventilator was resumed.  The patient was weaned from cardiopulmonary bypass without difficulty, maintaining a paced rhythm without inotropes required.  Blood pressure and cardiac output were normal.  Protamine was administered without adverse reaction.  The cannulas were removed.  The mediastinum was  irrigated with warm saline.  The superior pericardial fat was closed over the aorta.  An anterior mediastinal and left pleural chest tube were placed and brought out through separate incisions.  Sternum was closed with interrupted steel wire.  The pectoralis fascia was closed with running #1 Vicryl.  Subcutaneous and skin layers were closed in running Vicryl. Sterile dressings were applied.  Total bypass time was 129 minutes.     Kerin Perna, M.D.     PV/MEDQ  D:  10/28/2010  T:  10/28/2010  Job:  [id number]  cc:   Lyn Records, M.D.  Electronically Signed by Kerin Perna M.D. on 11/02/2010 09:32:55 AM

## 2010-11-03 LAB — GLUCOSE, CAPILLARY
Glucose-Capillary: 139 mg/dL — ABNORMAL HIGH (ref 70–99)
Glucose-Capillary: 119 mg/dL — ABNORMAL HIGH (ref 70–99)
Glucose-Capillary: 150 mg/dL — ABNORMAL HIGH (ref 70–99)
Glucose-Capillary: 135 mg/dL — ABNORMAL HIGH (ref 70–99)

## 2010-11-03 LAB — PROTIME-INR
INR: 1.63 — ABNORMAL HIGH (ref 0.00–1.49)
Prothrombin Time: 19.6 seconds — ABNORMAL HIGH (ref 11.6–15.2)

## 2010-11-04 LAB — PROTIME-INR
INR: 1.77 — ABNORMAL HIGH (ref 0.00–1.49)
Prothrombin Time: 20.9 seconds — ABNORMAL HIGH (ref 11.6–15.2)

## 2010-11-04 LAB — GLUCOSE, CAPILLARY
Glucose-Capillary: 112 mg/dL — ABNORMAL HIGH (ref 70–99)
Glucose-Capillary: 137 mg/dL — ABNORMAL HIGH (ref 70–99)

## 2010-11-04 NOTE — Cardiovascular Report (Signed)
NAME:  BYRNE, CAPEK NO.:  [id number]  MEDICAL RECORD NO.:  [id number]  LOCATION:                                 FACILITY:  PHYSICIAN:  Lyn Records, M.D.   DATE OF BIRTH:  Jul 29, 1940  DATE OF PROCEDURE:  10/21/2010 DATE OF DISCHARGE:                           CARDIAC CATHETERIZATION   REASON FOR PROCEDURE:  Abnormal Cardiolite study, exertional angina, and limiting exertional claudication bilaterally.  PROCEDURE PERFORMED: 1. Left heart cath. 2. Selective coronary angiography. 3. Left ventriculography. 4. Abdominal aortography.  DESCRIPTION:  A 4-French sheath was inserted after 1% Xylocaine.  The patient received 1 mg of Versed and 50 mcg of fentanyl.  A 4-French A2 multipurpose catheter was used for left ventricular angiography by hand injection, hemodynamic recordings, and right coronary angiography.  We used a #4, 4-French right Judkins catheter for right coronary angiography, a #4 and a #5, 4-French left Judkins catheter for left coronary angiography, and an angled pigtail catheter for abdominal aortography.  Aortography was performed because of claudication.  The injection rate was 16 mL per second for a total of 40 mL of contrast. The patient tolerated the procedure without complications.  Hemostasis was achieved with manual compression.  RESULTS: 1. Hemodynamic data:     a.     Aortic pressure 100/68 mmHg.     b.     Left ventricular pressure 100/12 mmHg. 2. Left ventriculography:  The left ventricle is normal in size.     There is distal anterior wall and apical moderate-to-severe     hypokinesis.  EF is estimated to be 40-45%.  No mitral     regurgitation is noted. 3. Coronary angiography:     a.     Left main coronary:  Severely stenosed in the distal segment      to 75-80% depending upon the view noted.     b.     Left anterior descending:  LAD reaches left ventricular      apex.  There is diffuse disease noted throughout the  proximal and      mid segment in particular.  No high-grade obstruction is noted in      the LAD.  The LAD is the site of previous PCI.  There was a      moderate-sized diagonal branch that arises from the LAD.  The      diagonal contains mid 70% stenosis.     c.     Circumflex artery:  The circumflex coronary artery contains      ostial 50-60% stenosis before the origin of the large first obtuse      marginal.  The ostial circumflex and ostial LAD both appear to be      involved in the distal left main disease.  The continuation of      circumflex after the first obtuse marginal is 99% occluded, but      the small obtuse marginal branches beyond they are too small to      bypass.     d.     Right coronary artery:  The right coronary artery is      dominant.  It gives origin  to PDA, left ventricular branch, and      collaterals to the distal circumflex.  The proximal right coronary      contains eccentric 50% narrowing.  The PDA proximal/ostium      contains 50% narrowing.  A large left ventricular branch is noted. 4. Abdominal aortography:  This is a poor quality study.  Left renal     artery is widely patent.  Diffuse atherosclerosis is noted in the     abdominal aorta beyond the renal arteries.  No aneurysm is seen.  CONCLUSION: 1. Severe left main disease including diffuse disease in the LAD,     first diagonal, and distal circumflex. 2. Decreased LV function with apical severe hypokinesis and EF of 40-     45%. 3. Diffuse distal aortic atherosclerosis.  No evidence of aneurysm.  PLAN:  The patient needs coronary artery bypass grafting.  I will refer to [facility].     Lyn Records, M.D.     HWS/MEDQ  D:  10/21/2010  T:  10/21/2010  Job:  [id number]  Electronically Signed by Verdis Prime M.D. on 11/04/2010 04:22:06 PM

## 2010-11-21 ENCOUNTER — Other Ambulatory Visit: Payer: Self-pay | Admitting: Cardiothoracic Surgery

## 2010-11-21 DIAGNOSIS — I2510 Atherosclerotic heart disease of native coronary artery without angina pectoris: Secondary | ICD-10-CM

## 2010-11-22 DIAGNOSIS — I2510 Atherosclerotic heart disease of native coronary artery without angina pectoris: Secondary | ICD-10-CM

## 2010-11-23 ENCOUNTER — Encounter: Payer: Self-pay | Admitting: Cardiothoracic Surgery

## 2010-11-23 ENCOUNTER — Ambulatory Visit (INDEPENDENT_AMBULATORY_CARE_PROVIDER_SITE_OTHER): Payer: Self-pay | Admitting: Cardiothoracic Surgery

## 2010-11-23 ENCOUNTER — Ambulatory Visit
Admission: RE | Admit: 2010-11-23 | Discharge: 2010-11-23 | Disposition: A | Discharge status: Home / Self Care | Payer: Medicare Other | Source: Ambulatory Visit | Attending: Cardiothoracic Surgery | Admitting: Cardiothoracic Surgery

## 2010-11-23 VITALS — BP 106/67 | HR 56 | Temp 96.9°F | Resp 16 | Ht 68.0 in | Wt 222.0 lb

## 2010-11-23 DIAGNOSIS — I2510 Atherosclerotic heart disease of native coronary artery without angina pectoris: Secondary | ICD-10-CM

## 2010-11-23 DIAGNOSIS — Z951 Presence of aortocoronary bypass graft: Secondary | ICD-10-CM

## 2010-11-23 NOTE — Progress Notes (Signed)
HPI:  Patient returns for routine postoperative follow-up having undergone coronary artery bypass grafting x4 on 10/27/2010. The patient's early postoperative recovery while in the hospital was notable for fluid retention, transient atrial fibrillation, and low oxygen saturation which responded to diuresis. He was restarted on his Coumadin for a preoperative history of paroxysmal atrial fibrillation. Since hospital discharge the patient reports no recurrent angina, no symptoms of CHF. He has had a poor appetite. He walks twice daily. The central chest tube site incision is not completely healed. Otherwise he denies any significant surgical incision problems. He denies any bleeding complications from his Coumadin and has had his INR checked at cardiology.   Current Outpatient Prescriptions  Medication Sig Dispense Refill  . aspirin 81 MG tablet Take 81 mg by mouth daily.        . digoxin (LANOXIN) 0.125 MG tablet Take 125 mcg by mouth daily.        Marland Kitchen docusate sodium (COLACE) 100 MG capsule Take 100 mg by mouth 2 (two) times daily.        . Fluticasone-Salmeterol (ADVAIR) 250-50 MCG/DOSE AEPB Inhale 1 puff into the lungs every 12 (twelve) hours.        . hydrochlorothiazide 25 MG tablet Take 25 mg by mouth daily.        . metFORMIN (GLUCOPHAGE) 500 MG tablet Take 500 mg by mouth 2 (two) times daily with a meal.        . metoprolol (LOPRESSOR) 100 MG tablet Take 100 mg by mouth 2 (two) times daily.        . nitroGLYCERIN (NITROSTAT) 0.4 MG SL tablet Place 0.4 mg under the tongue every 5 (five) minutes as needed.        Marland Kitchen olmesartan (BENICAR) 20 MG tablet Take 20 mg by mouth daily. 1/2 tab po every day       . oxycodone (OXY-IR) 5 MG capsule Take 5 mg by mouth every 4 (four) hours as needed.        . rosuvastatin (CRESTOR) 20 MG tablet Take 20 mg by mouth daily.        Marland Kitchen senna (SENOKOT) 8.6 MG tablet Take 2 tablets by mouth daily.        Marland Kitchen warfarin (COUMADIN) 5 MG tablet Take 5 mg by mouth daily.         Marland Kitchen guaiFENesin (MUCINEX) 600 MG 12 hr tablet Take 1,200 mg by mouth 2 (two) times daily.        . traMADol-acetaminophen (ULTRACET) 37.5-325 MG per tablet Take 1 tablet by mouth every 6 (six) hours as needed.          Physical Exam Blood pressure 108/70 pulse 52 oxygen saturation 96% on room air Middle-aged male in no distress accompanied by his wife. The sternal incision is well-healed. The chest is stable. Breath sounds are clear and equal bilaterally. Rate rhythm is regular and there is no gallop murmur or rub. The leg incision is well-healed. There is no pedal edema. The chest tube site below the sternal incision is clean with peroxide saline and covered with a Neosporin Band-Aid dressing. Diagnostic Tests:  A PA and lateral chest x-ray today is clear lung fields, no pleural effusions, nNoli.   Impression: Stable course following multivessel bypass grafting 3 weeks ago. The patient is ready to resume driving light activities and outpatient cardiac rehabilitation. We made several changes in his medications so he will transition back to his preoperative medications for hypertension cholesterol and we will discontinue  the digoxin the to his slow heart rate and GI side effects.   Plan: He'll return to see Dr. Katrinka Blazing for further fine-tuning of his medications return here as needed.

## 2010-11-23 NOTE — Patient Instructions (Signed)
1.decrease metformin to one daily (500 mg) 2.Resume Diovan 160 mg daily and stop Benicar 3.Stop Advair inhaler when out 4.Change HCTZ to 12.5 mg daily  5.Resume atenolol and stop metoprolol 6.Resume simvastatin and stop Crestor 7.Resume Felodipine 10 mg daily 8.Stop digoxin  OK to drive,lift no more than [id number] until Dec 1st OK to begin Cardiac Rehab at [facility]

## 2010-11-25 NOTE — Discharge Summary (Signed)
NAME:  Albert Short, Albert Short NO.:  [id number]  MEDICAL RECORD NO.:  [id number]  LOCATION:  2017                         FACILITY:  [facility]  PHYSICIAN:  Kerin Perna, M.D.  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 Kathlee Nations Trigt for consideration of surgical revascularization.  The patient has an extensive cardiac history with myocardial infarction in 1988 which was treated medically.  He has been managed well until recently with medical therapy when he developed exertional chest pain and shortness of breath as well as claudication, the bilateral calves and buttocks.  A myocardial perfusion stress test performed by Dr. Katrinka Blazing was abnormal showing ischemia and scarring in the apex of the inferior wall.  A subsequent lower extremity Doppler study revealed bilateral ankle-brachial indices of 0.8.  Cardiac catheterization revealed an 80% left main stenosis with high-grade LAD diagonal disease.  Additionally there was high-grade stenosis of the obtuse marginal #1 origin of the circumflex and distal right coronary artery stenosis with ejection fraction estimated at 45%.  Based on the patient's symptoms, stress test result and coronary anatomy, he was felt to be a candidate for a surgical coronary revascularization.  He was admitted this hospitalization for the procedure.  PAST MEDICAL HISTORY: 1. Type 2 diabetes mellitus, currently treated with oral agents. 2. Hypertension. 3. Hyperlipidemia. 4. History of paroxysmal atrial fibrillation on Coumadin for     approximately 6 months. 5. Obesity. 6. Gastroesophageal reflux disease. 7. Peripheral vascular disease  with bilateral lower extremity     claudication.  SOCIAL HISTORY:  Quit smoking 5 years ago.  He does not drink alcohol. He is retired and lives with his wife.  He lives a fairly sedentary lifestyle.  Medications prior to admission included the following: 1. Hydrochlorothiazide 25 mg daily. 2. Coumadin 5 mg daily. 3. Simvastatin 80 mg daily. 4. Valsartan 320 mg daily. 5. Felodipine 10 mg daily. 6. Atenolol 50 mg daily. 7. Tramadol/acetaminophen 37.5/325 mg 1 tablet daily p.r.n. 8. Metformin 500 mg p.o. daily.  ALLERGIES:  No known drug allergies.  REVIEW OF SYMPTOMS/PHYSICAL EXAMINATION:  Please see the history and physical dictated at time of surgery.  HOSPITAL COURSE:  The patient was admitted electively and on October 27, 2010, he underwent the following procedure.  Coronary artery bypass grafting x4.  The following grafts were placed. 1. Left internal mammary artery to the LAD. 2. Saphenous vein graft to the diagonal. 3. Saphenous vein graft to the obtuse marginal. 4. Saphenous vein graft to the posterior descending.  OPERATIVE FINDINGS: 1. Calcified disease targets but adequate for grafts. 2. Small vein conduit but adequate. 3. No blood products required.  The patient was taken to the surgical intensive care unit in stable condition.  POSTOPERATIVE HOSPITAL COURSE:  The patient has overall progressed nicely.  He has had postoperative atrial fibrillation which has been somewhat difficult to control.  Medications have been limited due to tachybrady electrophysiology.  He has been restarted on his oral Coumadin.  He was weaned from the ventilator early without difficulty. All routine lines, monitors, and drainage devices have been discontinued in the standard fashion.  He does have an acute blood loss anemia.  His values have stabilized.  His most recent hemoglobin and hematocrit dated November 02, 2010 was stable at 11.2 and 32.9 respectively.  Renal function is stable  with most recent BUN and creatinine dated November 02, 2010 at 35 and 1.10 respectively.  He does have some volume overload but is responding well to diuretic therapy.  His diabetes has been under excellent control using standard protocols and the patient is currently stable on oral regimen.  His incisions are healing well without evidence of infection.  His oxygen has been weaned and he maintains good saturations on room air.  He does have some difficulty with the physical rehabilitation aspect of his recovery.  Due to this and the fact that his wife is somewhat limited in her physical abilities due to bilateral knee replacements, it is felt that he will require short-term skilled nursing facility placement to continue his rehabilitation.  He is currently felt to be stable for transfer to the nursing facility when the bed becomes available.  MEDICATIONS AT DISCHARGE:  At the time of this dictation include the following: 1. Aspirin 81 mg enteric-coated tablet daily. 2. Colace 100 mg 2 tablets daily. 3. Advair 250/50 inhalation 1 puff twice daily. 4. Robitussin DM 15 mL q.4 h. p.r.n. for cough. 5. Maalox 15-30 mL every 4 hours p.r.n. 6. Glucophage 500 mg b.i.d. 7. Lopressor 25 mg every 6 hours. 8. Benicar 10 mg daily. 9. Oxycodone 5-10 mg every 4-6 hours p.r.n. for pain. 10.Crestor 20 mg daily. 11.Hydrochlorothiazide 25 mg daily. 12.Warfarin 5 mg daily and as directed based on monitoring INR, blood     work.  He should probably have a PT/INR tested within 2-3 days of     arrival to the nursing facility, so dosing can be adjusted as     needed.  INSTRUCTIONS:  The patient will receive written instructions regarding medications, activity, diet, wound care, and followup.  Followup should include Dr. Verdis Prime 2 weeks post discharge, Dr. Donata Clay 3 weeks September 19 at 9:30 a.m.  He will get a chest x-ray at that time to the [facility] at the [facility].  WOUND  CARE:  The patient may clean his incisions gently with soap and water.  PHYSICAL RESTRICTIONS:  Including no lifting more than 10 pounds. Sternal precautions as instructed.  He should have skilled physical therapy, occupational therapy as available at the nursing facility for further rehabilitation and with strengthening and endurance modalities.  FINAL DIAGNOSES:  Severe left main and multivessel coronary artery disease now status post coronary surgical revascularization as described.  Other diagnoses include acute blood loss anemia, postoperative volume overload, history of paroxysmal atrial fibrillation as well as postoperative recurrence.  Diabetes mellitus type 2, history of tobacco abuse, history of hypertension, history of dyslipidemia, history of peripheral vascular disease with bilateral lower extremity claudication.     Rowe Clack, P.A.-C.   ______________________________ Kerin Perna, M.D.    Sherryll Burger  D:  11/03/2010  T:  11/03/2010  Job:  [id number]  cc:   Kerin Perna, M.D. Lyn Records, M.D.  Electronically Signed by Gershon Crane P.A.-C. on 11/10/2010 12:29:38 PM Electronically Signed by Kerin Perna M.D. on 11/25/2010 01:20:41 PM

## 2010-11-25 NOTE — Discharge Summary (Signed)
  NAME:  Albert Short, GEERS NO.:  [id number]  MEDICAL RECORD NO.:  [id number]  LOCATION:  2005                         FACILITY:  [facility]  PHYSICIAN:  Kerin Perna, M.D.  DATE OF BIRTH:  [birth year]  DATE OF ADMISSION:  10/27/2010 DATE OF DISCHARGE:                              DISCHARGE SUMMARY   ADDENDUM:  The patient was discharged to skilled nursing facility today, November 04, 2010.  On a.m. rounds, November 04, 2010, the patient was noted to be afebrile and currently rate controlled atrial fibrillation with heart rate in the 90s, blood pressure stable, and O2 saturations greater than 90% on room air.  Over the past 24 hours, the patient's heart rate noted to be increasing up to the 140s with ambulation.  He was seen and evaluated by Dr. Katrinka Blazing this morning and he increased his metoprolol dose to 100 mg p.o. b.i.d. and continued his digoxin at 0.125 mg daily.  Dr. Katrinka Blazing recommends ambulating the patient 2 hours after first metoprolol dose and if heart rate is controlled, he is okay with the patient transferring to skilled nursing facility.  On physical exam,  the patient's incisions were noted to be clean, dry, and intact and healing well.  His lungs were clear to auscultation bilaterally.  The a.m. labs show INR of 1.77.  His blood sugars were continued to be well controlled.  Nurse will give the patient his a.m. metoprolol and ambulate 2 hours following dose.  She is to contact Dr. Katrinka Blazing following and if Cardiology was okay with transfer, we will plan to transfer to [facility] later today.  Please see dictated discharge summary for followup appointments and discharge instructions.  DISCHARGE MEDICATIONS: 1. Enteric-coated aspirin 81 mg daily. 2. Digoxin 0.125 mg daily. 3. Colace 200 mg daily. 4. Advair 250/50 one puff b.i.d. 5. Guaifenesin q.4 h p.r.n. 6. Metoprolol tartrate 100 mg b.i.d. 7. Maalox 15-30 mL q.4 h p.r.n. 8. Metformin 500 mg b.i.d. 9.  Benicar 10 mg daily. 10.Oxycodone 5 mg 1-2 tablets q.4-6 h p.r.n. pain. 11.Crestor 20 mg daily. 12.HCTZ 25 mg daily. 13.Coumadin 5 mg daily.  This patient's INR levels to be drawn at the skilled nursing facility on November 08, 2010, with results sent to [facility].     Sol Blazing, PA   ______________________________ Kerin Perna, M.D.    KMD/MEDQ  D:  11/04/2010  T:  11/04/2010  Job:  [id number]  Electronically Signed by Cameron Proud PA on 11/15/2010 01:03:32 PM Electronically Signed by Kerin Perna M.D. on 11/25/2010 01:20:57 PM

## 2010-11-30 ENCOUNTER — Telehealth: Payer: Self-pay

## 2010-12-12 ENCOUNTER — Other Ambulatory Visit: Payer: Self-pay | Admitting: Interventional Cardiology

## 2010-12-12 ENCOUNTER — Encounter (HOSPITAL_COMMUNITY)
Admission: RE | Admit: 2010-12-12 | Discharge: 2010-12-12 | Disposition: A | Discharge status: Home / Self Care | Payer: Medicare Other | Source: Ambulatory Visit | Attending: Interventional Cardiology | Admitting: Interventional Cardiology

## 2010-12-12 ENCOUNTER — Ambulatory Visit (HOSPITAL_COMMUNITY): Payer: Medicare Other

## 2010-12-12 DIAGNOSIS — I4891 Unspecified atrial fibrillation: Secondary | ICD-10-CM | POA: Insufficient documentation

## 2010-12-12 DIAGNOSIS — Z7901 Long term (current) use of anticoagulants: Secondary | ICD-10-CM | POA: Insufficient documentation

## 2010-12-12 DIAGNOSIS — Z79899 Other long term (current) drug therapy: Secondary | ICD-10-CM | POA: Insufficient documentation

## 2010-12-12 DIAGNOSIS — I739 Peripheral vascular disease, unspecified: Secondary | ICD-10-CM | POA: Insufficient documentation

## 2010-12-12 DIAGNOSIS — I495 Sick sinus syndrome: Secondary | ICD-10-CM | POA: Insufficient documentation

## 2010-12-12 DIAGNOSIS — I509 Heart failure, unspecified: Secondary | ICD-10-CM | POA: Insufficient documentation

## 2010-12-12 DIAGNOSIS — Z87891 Personal history of nicotine dependence: Secondary | ICD-10-CM | POA: Insufficient documentation

## 2010-12-12 DIAGNOSIS — I10 Essential (primary) hypertension: Secondary | ICD-10-CM | POA: Insufficient documentation

## 2010-12-12 DIAGNOSIS — I2510 Atherosclerotic heart disease of native coronary artery without angina pectoris: Secondary | ICD-10-CM | POA: Insufficient documentation

## 2010-12-12 DIAGNOSIS — Z7982 Long term (current) use of aspirin: Secondary | ICD-10-CM | POA: Insufficient documentation

## 2010-12-12 DIAGNOSIS — E119 Type 2 diabetes mellitus without complications: Secondary | ICD-10-CM | POA: Insufficient documentation

## 2010-12-12 DIAGNOSIS — Z5189 Encounter for other specified aftercare: Secondary | ICD-10-CM | POA: Insufficient documentation

## 2010-12-12 DIAGNOSIS — I252 Old myocardial infarction: Secondary | ICD-10-CM | POA: Insufficient documentation

## 2010-12-12 DIAGNOSIS — I5043 Acute on chronic combined systolic (congestive) and diastolic (congestive) heart failure: Secondary | ICD-10-CM | POA: Insufficient documentation

## 2010-12-12 DIAGNOSIS — E785 Hyperlipidemia, unspecified: Secondary | ICD-10-CM | POA: Insufficient documentation

## 2010-12-12 DIAGNOSIS — Z951 Presence of aortocoronary bypass graft: Secondary | ICD-10-CM | POA: Insufficient documentation

## 2010-12-12 LAB — GLUCOSE, CAPILLARY: Glucose-Capillary: 130 mg/dL — ABNORMAL HIGH (ref 70–99)

## 2010-12-14 ENCOUNTER — Other Ambulatory Visit: Payer: Self-pay | Admitting: Interventional Cardiology

## 2010-12-14 ENCOUNTER — Encounter (HOSPITAL_COMMUNITY): Payer: Medicare Other

## 2010-12-14 ENCOUNTER — Ambulatory Visit (HOSPITAL_COMMUNITY): Payer: Medicare Other

## 2010-12-14 LAB — GLUCOSE, CAPILLARY
Glucose-Capillary: 111 mg/dL — ABNORMAL HIGH (ref 70–99)
Glucose-Capillary: 96 mg/dL (ref 70–99)

## 2010-12-16 ENCOUNTER — Other Ambulatory Visit: Payer: Self-pay | Admitting: Interventional Cardiology

## 2010-12-16 ENCOUNTER — Encounter (HOSPITAL_COMMUNITY): Payer: Medicare Other

## 2010-12-16 ENCOUNTER — Ambulatory Visit (HOSPITAL_COMMUNITY): Payer: Medicare Other

## 2010-12-16 LAB — GLUCOSE, CAPILLARY: Glucose-Capillary: 107 mg/dL — ABNORMAL HIGH (ref 70–99)

## 2010-12-19 ENCOUNTER — Ambulatory Visit (HOSPITAL_COMMUNITY): Payer: Medicare Other

## 2010-12-19 ENCOUNTER — Other Ambulatory Visit: Payer: Self-pay | Admitting: Interventional Cardiology

## 2010-12-19 ENCOUNTER — Encounter (HOSPITAL_COMMUNITY): Payer: Medicare Other

## 2010-12-21 ENCOUNTER — Ambulatory Visit (HOSPITAL_COMMUNITY)
Admission: RE | Admit: 2010-12-21 | Discharge: 2010-12-21 | Disposition: A | Discharge status: Home / Self Care | Payer: Medicare Other | Source: Ambulatory Visit | Attending: Interventional Cardiology | Admitting: Interventional Cardiology

## 2010-12-21 ENCOUNTER — Ambulatory Visit (HOSPITAL_COMMUNITY): Payer: Medicare Other

## 2010-12-21 ENCOUNTER — Encounter (HOSPITAL_COMMUNITY): Payer: Medicare Other

## 2010-12-21 DIAGNOSIS — I4891 Unspecified atrial fibrillation: Secondary | ICD-10-CM | POA: Insufficient documentation

## 2010-12-21 DIAGNOSIS — I252 Old myocardial infarction: Secondary | ICD-10-CM | POA: Insufficient documentation

## 2010-12-23 ENCOUNTER — Ambulatory Visit (HOSPITAL_COMMUNITY): Payer: Medicare Other

## 2010-12-23 ENCOUNTER — Encounter (HOSPITAL_COMMUNITY): Payer: Medicare Other

## 2010-12-26 ENCOUNTER — Encounter (HOSPITAL_COMMUNITY): Payer: Medicare Other

## 2010-12-26 ENCOUNTER — Other Ambulatory Visit: Payer: Self-pay | Admitting: Interventional Cardiology

## 2010-12-26 ENCOUNTER — Ambulatory Visit (HOSPITAL_COMMUNITY): Payer: Medicare Other

## 2010-12-28 ENCOUNTER — Ambulatory Visit (HOSPITAL_COMMUNITY): Payer: Medicare Other

## 2010-12-28 ENCOUNTER — Encounter (HOSPITAL_COMMUNITY): Payer: Medicare Other

## 2010-12-30 ENCOUNTER — Encounter (HOSPITAL_COMMUNITY): Payer: Medicare Other

## 2010-12-30 ENCOUNTER — Ambulatory Visit (HOSPITAL_COMMUNITY): Payer: Medicare Other

## 2011-01-02 ENCOUNTER — Ambulatory Visit (HOSPITAL_COMMUNITY): Payer: Medicare Other

## 2011-01-02 ENCOUNTER — Encounter (HOSPITAL_COMMUNITY): Payer: Medicare Other

## 2011-01-04 ENCOUNTER — Other Ambulatory Visit: Payer: Self-pay | Admitting: Interventional Cardiology

## 2011-01-04 ENCOUNTER — Ambulatory Visit (HOSPITAL_COMMUNITY): Payer: Medicare Other

## 2011-01-04 ENCOUNTER — Encounter (HOSPITAL_COMMUNITY): Payer: Medicare Other

## 2011-01-06 ENCOUNTER — Encounter (HOSPITAL_COMMUNITY): Payer: Medicare Other

## 2011-01-06 ENCOUNTER — Ambulatory Visit (HOSPITAL_COMMUNITY): Payer: Medicare Other

## 2011-01-06 DIAGNOSIS — E785 Hyperlipidemia, unspecified: Secondary | ICD-10-CM | POA: Insufficient documentation

## 2011-01-06 DIAGNOSIS — Z5189 Encounter for other specified aftercare: Secondary | ICD-10-CM | POA: Insufficient documentation

## 2011-01-06 DIAGNOSIS — I495 Sick sinus syndrome: Secondary | ICD-10-CM | POA: Insufficient documentation

## 2011-01-06 DIAGNOSIS — Z79899 Other long term (current) drug therapy: Secondary | ICD-10-CM | POA: Insufficient documentation

## 2011-01-06 DIAGNOSIS — Z7982 Long term (current) use of aspirin: Secondary | ICD-10-CM | POA: Insufficient documentation

## 2011-01-06 DIAGNOSIS — E119 Type 2 diabetes mellitus without complications: Secondary | ICD-10-CM | POA: Insufficient documentation

## 2011-01-06 DIAGNOSIS — Z951 Presence of aortocoronary bypass graft: Secondary | ICD-10-CM | POA: Insufficient documentation

## 2011-01-06 DIAGNOSIS — I509 Heart failure, unspecified: Secondary | ICD-10-CM | POA: Insufficient documentation

## 2011-01-06 DIAGNOSIS — I252 Old myocardial infarction: Secondary | ICD-10-CM | POA: Insufficient documentation

## 2011-01-06 DIAGNOSIS — Z87891 Personal history of nicotine dependence: Secondary | ICD-10-CM | POA: Insufficient documentation

## 2011-01-06 DIAGNOSIS — I5043 Acute on chronic combined systolic (congestive) and diastolic (congestive) heart failure: Secondary | ICD-10-CM | POA: Insufficient documentation

## 2011-01-06 DIAGNOSIS — I4891 Unspecified atrial fibrillation: Secondary | ICD-10-CM | POA: Insufficient documentation

## 2011-01-06 DIAGNOSIS — Z7901 Long term (current) use of anticoagulants: Secondary | ICD-10-CM | POA: Insufficient documentation

## 2011-01-06 DIAGNOSIS — I739 Peripheral vascular disease, unspecified: Secondary | ICD-10-CM | POA: Insufficient documentation

## 2011-01-06 DIAGNOSIS — I10 Essential (primary) hypertension: Secondary | ICD-10-CM | POA: Insufficient documentation

## 2011-01-06 DIAGNOSIS — I2510 Atherosclerotic heart disease of native coronary artery without angina pectoris: Secondary | ICD-10-CM | POA: Insufficient documentation

## 2011-01-09 ENCOUNTER — Encounter (HOSPITAL_COMMUNITY): Payer: Medicare Other

## 2011-01-09 ENCOUNTER — Ambulatory Visit (HOSPITAL_COMMUNITY): Payer: Medicare Other

## 2011-01-11 ENCOUNTER — Ambulatory Visit (HOSPITAL_COMMUNITY): Payer: Medicare Other

## 2011-01-11 ENCOUNTER — Encounter (HOSPITAL_COMMUNITY): Payer: Medicare Other

## 2011-01-13 ENCOUNTER — Ambulatory Visit (HOSPITAL_COMMUNITY): Payer: Medicare Other

## 2011-01-13 ENCOUNTER — Encounter (HOSPITAL_COMMUNITY): Payer: Medicare Other

## 2011-01-16 ENCOUNTER — Ambulatory Visit (HOSPITAL_COMMUNITY): Payer: Medicare Other

## 2011-01-16 ENCOUNTER — Encounter (HOSPITAL_COMMUNITY): Payer: Medicare Other

## 2011-01-18 ENCOUNTER — Ambulatory Visit (HOSPITAL_COMMUNITY): Payer: Medicare Other

## 2011-01-18 ENCOUNTER — Encounter (HOSPITAL_COMMUNITY): Payer: Medicare Other

## 2011-01-20 ENCOUNTER — Ambulatory Visit (HOSPITAL_COMMUNITY): Payer: Medicare Other

## 2011-01-20 ENCOUNTER — Encounter (HOSPITAL_COMMUNITY)
Admission: RE | Admit: 2011-01-20 | Discharge: 2011-01-20 | Disposition: A | Discharge status: Home / Self Care | Payer: Medicare Other | Source: Ambulatory Visit | Attending: Interventional Cardiology | Admitting: Interventional Cardiology

## 2011-01-23 ENCOUNTER — Encounter (HOSPITAL_COMMUNITY)
Admission: RE | Admit: 2011-01-23 | Discharge: 2011-01-23 | Disposition: A | Discharge status: Home / Self Care | Payer: Medicare Other | Source: Ambulatory Visit | Attending: Interventional Cardiology | Admitting: Interventional Cardiology

## 2011-01-23 ENCOUNTER — Ambulatory Visit (HOSPITAL_COMMUNITY): Payer: Medicare Other

## 2011-01-25 ENCOUNTER — Encounter (HOSPITAL_COMMUNITY)
Admission: RE | Admit: 2011-01-25 | Discharge: 2011-01-25 | Disposition: A | Discharge status: Home / Self Care | Payer: Medicare Other | Source: Ambulatory Visit | Attending: Interventional Cardiology | Admitting: Interventional Cardiology

## 2011-01-25 ENCOUNTER — Ambulatory Visit (HOSPITAL_COMMUNITY): Payer: Medicare Other

## 2011-01-30 ENCOUNTER — Ambulatory Visit (HOSPITAL_COMMUNITY): Payer: Medicare Other

## 2011-01-30 ENCOUNTER — Encounter (HOSPITAL_COMMUNITY)
Admission: RE | Admit: 2011-01-30 | Discharge: 2011-01-30 | Disposition: A | Discharge status: Home / Self Care | Payer: Medicare Other | Source: Ambulatory Visit | Attending: Interventional Cardiology | Admitting: Interventional Cardiology

## 2011-02-01 ENCOUNTER — Encounter (HOSPITAL_COMMUNITY)
Admission: RE | Admit: 2011-02-01 | Discharge: 2011-02-01 | Disposition: A | Discharge status: Home / Self Care | Payer: Medicare Other | Source: Ambulatory Visit | Attending: Interventional Cardiology | Admitting: Interventional Cardiology

## 2011-02-01 ENCOUNTER — Ambulatory Visit (HOSPITAL_COMMUNITY): Payer: Medicare Other

## 2011-02-03 ENCOUNTER — Ambulatory Visit (HOSPITAL_COMMUNITY): Payer: Medicare Other

## 2011-02-03 ENCOUNTER — Encounter (HOSPITAL_COMMUNITY)
Admission: RE | Admit: 2011-02-03 | Discharge: 2011-02-03 | Disposition: A | Discharge status: Home / Self Care | Payer: Medicare Other | Source: Ambulatory Visit | Attending: Interventional Cardiology | Admitting: Interventional Cardiology

## 2011-02-06 ENCOUNTER — Encounter (HOSPITAL_COMMUNITY)
Admission: RE | Admit: 2011-02-06 | Discharge: 2011-02-06 | Disposition: A | Discharge status: Home / Self Care | Payer: Medicare Other | Source: Ambulatory Visit | Attending: Interventional Cardiology | Admitting: Interventional Cardiology

## 2011-02-06 ENCOUNTER — Ambulatory Visit (HOSPITAL_COMMUNITY): Payer: Medicare Other

## 2011-02-06 DIAGNOSIS — I10 Essential (primary) hypertension: Secondary | ICD-10-CM | POA: Insufficient documentation

## 2011-02-06 DIAGNOSIS — E785 Hyperlipidemia, unspecified: Secondary | ICD-10-CM | POA: Insufficient documentation

## 2011-02-06 DIAGNOSIS — Z7901 Long term (current) use of anticoagulants: Secondary | ICD-10-CM | POA: Insufficient documentation

## 2011-02-06 DIAGNOSIS — Z5189 Encounter for other specified aftercare: Secondary | ICD-10-CM | POA: Insufficient documentation

## 2011-02-06 DIAGNOSIS — E119 Type 2 diabetes mellitus without complications: Secondary | ICD-10-CM | POA: Insufficient documentation

## 2011-02-06 DIAGNOSIS — I4891 Unspecified atrial fibrillation: Secondary | ICD-10-CM | POA: Insufficient documentation

## 2011-02-06 DIAGNOSIS — I739 Peripheral vascular disease, unspecified: Secondary | ICD-10-CM | POA: Insufficient documentation

## 2011-02-06 DIAGNOSIS — Z87891 Personal history of nicotine dependence: Secondary | ICD-10-CM | POA: Insufficient documentation

## 2011-02-06 DIAGNOSIS — I2510 Atherosclerotic heart disease of native coronary artery without angina pectoris: Secondary | ICD-10-CM | POA: Insufficient documentation

## 2011-02-06 DIAGNOSIS — I509 Heart failure, unspecified: Secondary | ICD-10-CM | POA: Insufficient documentation

## 2011-02-06 DIAGNOSIS — I252 Old myocardial infarction: Secondary | ICD-10-CM | POA: Insufficient documentation

## 2011-02-06 DIAGNOSIS — Z7982 Long term (current) use of aspirin: Secondary | ICD-10-CM | POA: Insufficient documentation

## 2011-02-06 DIAGNOSIS — Z951 Presence of aortocoronary bypass graft: Secondary | ICD-10-CM | POA: Insufficient documentation

## 2011-02-06 DIAGNOSIS — I5043 Acute on chronic combined systolic (congestive) and diastolic (congestive) heart failure: Secondary | ICD-10-CM | POA: Insufficient documentation

## 2011-02-06 DIAGNOSIS — Z79899 Other long term (current) drug therapy: Secondary | ICD-10-CM | POA: Insufficient documentation

## 2011-02-06 DIAGNOSIS — I495 Sick sinus syndrome: Secondary | ICD-10-CM | POA: Insufficient documentation

## 2011-02-08 ENCOUNTER — Encounter (HOSPITAL_COMMUNITY)
Admission: RE | Admit: 2011-02-08 | Discharge: 2011-02-08 | Disposition: A | Discharge status: Home / Self Care | Payer: Medicare Other | Source: Ambulatory Visit | Attending: Interventional Cardiology | Admitting: Interventional Cardiology

## 2011-02-08 ENCOUNTER — Ambulatory Visit (HOSPITAL_COMMUNITY): Payer: Medicare Other

## 2011-02-10 ENCOUNTER — Encounter (HOSPITAL_COMMUNITY)
Admission: RE | Admit: 2011-02-10 | Discharge: 2011-02-10 | Disposition: A | Discharge status: Home / Self Care | Payer: Medicare Other | Source: Ambulatory Visit | Attending: Interventional Cardiology | Admitting: Interventional Cardiology

## 2011-02-10 ENCOUNTER — Ambulatory Visit (HOSPITAL_COMMUNITY): Payer: Medicare Other

## 2011-02-13 ENCOUNTER — Ambulatory Visit (HOSPITAL_COMMUNITY): Payer: Medicare Other

## 2011-02-13 ENCOUNTER — Encounter (HOSPITAL_COMMUNITY)
Admission: RE | Admit: 2011-02-13 | Discharge: 2011-02-13 | Disposition: A | Discharge status: Home / Self Care | Payer: Medicare Other | Source: Ambulatory Visit | Attending: Interventional Cardiology | Admitting: Interventional Cardiology

## 2011-02-15 ENCOUNTER — Ambulatory Visit (HOSPITAL_COMMUNITY): Payer: Medicare Other

## 2011-02-15 ENCOUNTER — Encounter (HOSPITAL_COMMUNITY)
Admission: RE | Admit: 2011-02-15 | Discharge: 2011-02-15 | Disposition: A | Discharge status: Home / Self Care | Payer: Medicare Other | Source: Ambulatory Visit | Attending: Interventional Cardiology | Admitting: Interventional Cardiology

## 2011-02-17 ENCOUNTER — Ambulatory Visit (HOSPITAL_COMMUNITY): Payer: Medicare Other

## 2011-02-17 ENCOUNTER — Encounter (HOSPITAL_COMMUNITY): Payer: Medicare Other

## 2011-02-20 ENCOUNTER — Encounter (HOSPITAL_COMMUNITY)
Admission: RE | Admit: 2011-02-20 | Discharge: 2011-02-20 | Disposition: A | Discharge status: Home / Self Care | Payer: Medicare Other | Source: Ambulatory Visit | Attending: Interventional Cardiology | Admitting: Interventional Cardiology

## 2011-02-20 ENCOUNTER — Ambulatory Visit (HOSPITAL_COMMUNITY): Payer: Medicare Other

## 2011-02-22 ENCOUNTER — Encounter (HOSPITAL_COMMUNITY)
Admission: RE | Admit: 2011-02-22 | Discharge: 2011-02-22 | Disposition: A | Discharge status: Home / Self Care | Payer: Medicare Other | Source: Ambulatory Visit | Attending: Interventional Cardiology | Admitting: Interventional Cardiology

## 2011-02-22 ENCOUNTER — Ambulatory Visit (HOSPITAL_COMMUNITY): Payer: Medicare Other

## 2011-02-24 ENCOUNTER — Ambulatory Visit (HOSPITAL_COMMUNITY): Payer: Medicare Other

## 2011-02-24 ENCOUNTER — Encounter (HOSPITAL_COMMUNITY)
Admission: RE | Admit: 2011-02-24 | Discharge: 2011-02-24 | Disposition: A | Discharge status: Home / Self Care | Payer: Medicare Other | Source: Ambulatory Visit | Attending: Interventional Cardiology | Admitting: Interventional Cardiology

## 2011-03-01 ENCOUNTER — Encounter (HOSPITAL_COMMUNITY): Payer: Medicare Other

## 2011-03-01 ENCOUNTER — Ambulatory Visit (HOSPITAL_COMMUNITY): Payer: Medicare Other

## 2011-03-03 ENCOUNTER — Ambulatory Visit (HOSPITAL_COMMUNITY): Payer: Medicare Other

## 2011-03-03 ENCOUNTER — Encounter (HOSPITAL_COMMUNITY): Payer: Medicare Other

## 2011-03-06 ENCOUNTER — Encounter (HOSPITAL_COMMUNITY): Payer: Medicare Other

## 2011-03-06 ENCOUNTER — Ambulatory Visit (HOSPITAL_COMMUNITY): Payer: Medicare Other

## 2011-03-08 ENCOUNTER — Ambulatory Visit (HOSPITAL_COMMUNITY): Payer: Medicare Other

## 2011-03-08 ENCOUNTER — Encounter (HOSPITAL_COMMUNITY): Payer: Medicare Other

## 2011-03-10 ENCOUNTER — Ambulatory Visit (HOSPITAL_COMMUNITY): Payer: Medicare Other

## 2011-03-10 ENCOUNTER — Encounter (HOSPITAL_COMMUNITY)
Admission: RE | Admit: 2011-03-10 | Discharge: 2011-03-10 | Disposition: A | Discharge status: Home / Self Care | Payer: Medicare Other | Source: Ambulatory Visit | Attending: Interventional Cardiology | Admitting: Interventional Cardiology

## 2011-03-10 DIAGNOSIS — I739 Peripheral vascular disease, unspecified: Secondary | ICD-10-CM | POA: Insufficient documentation

## 2011-03-10 DIAGNOSIS — I509 Heart failure, unspecified: Secondary | ICD-10-CM | POA: Insufficient documentation

## 2011-03-10 DIAGNOSIS — Z7982 Long term (current) use of aspirin: Secondary | ICD-10-CM | POA: Insufficient documentation

## 2011-03-10 DIAGNOSIS — Z951 Presence of aortocoronary bypass graft: Secondary | ICD-10-CM | POA: Insufficient documentation

## 2011-03-10 DIAGNOSIS — Z79899 Other long term (current) drug therapy: Secondary | ICD-10-CM | POA: Insufficient documentation

## 2011-03-10 DIAGNOSIS — I10 Essential (primary) hypertension: Secondary | ICD-10-CM | POA: Insufficient documentation

## 2011-03-10 DIAGNOSIS — Z7901 Long term (current) use of anticoagulants: Secondary | ICD-10-CM | POA: Insufficient documentation

## 2011-03-10 DIAGNOSIS — I4891 Unspecified atrial fibrillation: Secondary | ICD-10-CM | POA: Insufficient documentation

## 2011-03-10 DIAGNOSIS — I5043 Acute on chronic combined systolic (congestive) and diastolic (congestive) heart failure: Secondary | ICD-10-CM | POA: Insufficient documentation

## 2011-03-10 DIAGNOSIS — I2510 Atherosclerotic heart disease of native coronary artery without angina pectoris: Secondary | ICD-10-CM | POA: Insufficient documentation

## 2011-03-10 DIAGNOSIS — I252 Old myocardial infarction: Secondary | ICD-10-CM | POA: Insufficient documentation

## 2011-03-10 DIAGNOSIS — I495 Sick sinus syndrome: Secondary | ICD-10-CM | POA: Insufficient documentation

## 2011-03-10 DIAGNOSIS — E785 Hyperlipidemia, unspecified: Secondary | ICD-10-CM | POA: Insufficient documentation

## 2011-03-10 DIAGNOSIS — Z87891 Personal history of nicotine dependence: Secondary | ICD-10-CM | POA: Insufficient documentation

## 2011-03-10 DIAGNOSIS — E119 Type 2 diabetes mellitus without complications: Secondary | ICD-10-CM | POA: Insufficient documentation

## 2011-03-10 DIAGNOSIS — Z5189 Encounter for other specified aftercare: Secondary | ICD-10-CM | POA: Insufficient documentation

## 2011-03-13 ENCOUNTER — Encounter (HOSPITAL_COMMUNITY)
Admission: RE | Admit: 2011-03-13 | Discharge: 2011-03-13 | Disposition: A | Discharge status: Home / Self Care | Payer: Medicare Other | Source: Ambulatory Visit | Attending: Interventional Cardiology | Admitting: Interventional Cardiology

## 2011-03-13 ENCOUNTER — Ambulatory Visit (HOSPITAL_COMMUNITY): Payer: Medicare Other

## 2011-03-15 ENCOUNTER — Ambulatory Visit (HOSPITAL_COMMUNITY): Payer: Medicare Other

## 2011-03-15 ENCOUNTER — Encounter (HOSPITAL_COMMUNITY)
Admission: RE | Admit: 2011-03-15 | Discharge: 2011-03-15 | Disposition: A | Discharge status: Home / Self Care | Payer: Medicare Other | Source: Ambulatory Visit | Attending: Interventional Cardiology | Admitting: Interventional Cardiology

## 2011-03-17 ENCOUNTER — Encounter (HOSPITAL_COMMUNITY)
Admission: RE | Admit: 2011-03-17 | Discharge: 2011-03-17 | Disposition: A | Discharge status: Home / Self Care | Payer: Medicare Other | Source: Ambulatory Visit | Attending: Interventional Cardiology | Admitting: Interventional Cardiology

## 2011-03-17 ENCOUNTER — Ambulatory Visit (HOSPITAL_COMMUNITY): Payer: Medicare Other

## 2011-03-20 ENCOUNTER — Encounter (HOSPITAL_COMMUNITY)
Admission: RE | Admit: 2011-03-20 | Discharge: 2011-03-20 | Disposition: A | Discharge status: Home / Self Care | Payer: Medicare Other | Source: Ambulatory Visit | Attending: Interventional Cardiology | Admitting: Interventional Cardiology

## 2011-03-22 ENCOUNTER — Encounter (HOSPITAL_COMMUNITY)
Admission: RE | Admit: 2011-03-22 | Discharge: 2011-03-22 | Disposition: A | Discharge status: Home / Self Care | Payer: Medicare Other | Source: Ambulatory Visit | Attending: Interventional Cardiology | Admitting: Interventional Cardiology

## 2011-03-24 ENCOUNTER — Encounter (HOSPITAL_COMMUNITY): Payer: Medicare Other

## 2011-03-27 ENCOUNTER — Encounter (HOSPITAL_COMMUNITY)
Admission: RE | Admit: 2011-03-27 | Discharge: 2011-03-27 | Disposition: A | Discharge status: Home / Self Care | Payer: Medicare Other | Source: Ambulatory Visit | Attending: Interventional Cardiology | Admitting: Interventional Cardiology

## 2011-03-29 ENCOUNTER — Encounter (HOSPITAL_COMMUNITY)
Admission: RE | Admit: 2011-03-29 | Discharge: 2011-03-29 | Disposition: A | Discharge status: Home / Self Care | Payer: Medicare Other | Source: Ambulatory Visit | Attending: Interventional Cardiology | Admitting: Interventional Cardiology

## 2011-03-31 ENCOUNTER — Encounter (HOSPITAL_COMMUNITY): Payer: Medicare Other

## 2011-04-03 ENCOUNTER — Encounter (HOSPITAL_COMMUNITY)
Admission: RE | Admit: 2011-04-03 | Discharge: 2011-04-03 | Disposition: A | Discharge status: Home / Self Care | Payer: Medicare Other | Source: Ambulatory Visit | Attending: Interventional Cardiology | Admitting: Interventional Cardiology

## 2011-04-05 ENCOUNTER — Encounter (HOSPITAL_COMMUNITY)
Admission: RE | Admit: 2011-04-05 | Discharge: 2011-04-05 | Disposition: A | Discharge status: Home / Self Care | Payer: Medicare Other | Source: Ambulatory Visit | Attending: Interventional Cardiology | Admitting: Interventional Cardiology

## 2011-04-07 ENCOUNTER — Encounter (HOSPITAL_COMMUNITY)
Admission: RE | Admit: 2011-04-07 | Discharge: 2011-04-07 | Disposition: A | Discharge status: Home / Self Care | Payer: Medicare Other | Source: Ambulatory Visit | Attending: Interventional Cardiology | Admitting: Interventional Cardiology

## 2011-04-07 DIAGNOSIS — I4891 Unspecified atrial fibrillation: Secondary | ICD-10-CM | POA: Insufficient documentation

## 2011-04-07 DIAGNOSIS — I739 Peripheral vascular disease, unspecified: Secondary | ICD-10-CM | POA: Insufficient documentation

## 2011-04-07 DIAGNOSIS — Z951 Presence of aortocoronary bypass graft: Secondary | ICD-10-CM | POA: Insufficient documentation

## 2011-04-07 DIAGNOSIS — Z87891 Personal history of nicotine dependence: Secondary | ICD-10-CM | POA: Insufficient documentation

## 2011-04-07 DIAGNOSIS — I2510 Atherosclerotic heart disease of native coronary artery without angina pectoris: Secondary | ICD-10-CM | POA: Insufficient documentation

## 2011-04-07 DIAGNOSIS — E785 Hyperlipidemia, unspecified: Secondary | ICD-10-CM | POA: Insufficient documentation

## 2011-04-07 DIAGNOSIS — Z7901 Long term (current) use of anticoagulants: Secondary | ICD-10-CM | POA: Insufficient documentation

## 2011-04-07 DIAGNOSIS — I10 Essential (primary) hypertension: Secondary | ICD-10-CM | POA: Insufficient documentation

## 2011-04-07 DIAGNOSIS — Z5189 Encounter for other specified aftercare: Secondary | ICD-10-CM | POA: Insufficient documentation

## 2011-04-07 DIAGNOSIS — Z7982 Long term (current) use of aspirin: Secondary | ICD-10-CM | POA: Insufficient documentation

## 2011-04-07 DIAGNOSIS — I495 Sick sinus syndrome: Secondary | ICD-10-CM | POA: Insufficient documentation

## 2011-04-07 DIAGNOSIS — I5043 Acute on chronic combined systolic (congestive) and diastolic (congestive) heart failure: Secondary | ICD-10-CM | POA: Insufficient documentation

## 2011-04-07 DIAGNOSIS — Z79899 Other long term (current) drug therapy: Secondary | ICD-10-CM | POA: Insufficient documentation

## 2011-04-07 DIAGNOSIS — E119 Type 2 diabetes mellitus without complications: Secondary | ICD-10-CM | POA: Insufficient documentation

## 2011-04-07 DIAGNOSIS — I252 Old myocardial infarction: Secondary | ICD-10-CM | POA: Insufficient documentation

## 2011-04-07 DIAGNOSIS — I509 Heart failure, unspecified: Secondary | ICD-10-CM | POA: Insufficient documentation

## 2011-04-10 ENCOUNTER — Encounter (HOSPITAL_COMMUNITY)
Admission: RE | Admit: 2011-04-10 | Discharge: 2011-04-10 | Disposition: A | Discharge status: Home / Self Care | Payer: Medicare Other | Source: Ambulatory Visit | Attending: Interventional Cardiology | Admitting: Interventional Cardiology

## 2011-04-12 ENCOUNTER — Encounter (HOSPITAL_COMMUNITY): Payer: Medicare Other

## 2011-04-12 NOTE — Progress Notes (Addendum)
Cardiac Rehabilitation Program Progress Report   Orientation:  12/08/2010 Graduate Date:  04/19/2011  # of sessions completed: 36/36  Cardiologist: Josefa Half MD:  Carley Hammed Time:  1115  A.  Exercise Program:  Tolerates exercise @ 3.0 METS for 30 minutes, Bike Test Results:  Pre: 0.75 miles and Post: 1.03 miles, Improved functional capacity    37.3 %, No Change  muscular strength, Improved  flexibility 16.7 %, Needs encouragement on exercise program and Discharged to home   exercise program.  Anticipated compliance:  fair  B.  Mental Health:  Good mental attitude No post QOL.  Initial Scores: Overall 24.80, Health and Functioning 24.03, Socioeconominc 21.33,   Psychological/Spiritual 25.71, Family 30  C.  Education/Instruction/Skills  Knows THR for exercise, Uses Perceived Exertion Scale and/or Dyspnea Scale and Attended 11/15 education classes  Home exercise given: 12/19/2010   D.  Nutrition/Weight Control/Body Composition:  Adherence to prescribed nutrition program: poor , BMI 33.1, % Body Fat  32.1, Patient has gained 6.0 kg likely due to tobacco cessation, Evidence of fat weight loss 3.0%  *This section completed by Mickle Plumb, Andres Shad, RD, LDN, CDE  E.  Blood Lipids Lipid Panel  No results found for this basename: chol, trig, hdl, cholhdl, vldl, ldlcalc   F.  Lifestyle Changes:  Making positive lifestyle changes  G.  Symptoms noted with exercise:  Resting hypertension and Exertional hypertension and Bradycardia  Report Completed By:  Hazle Nordmann   Comments:  Pt did fairly well in program progressing from 2.1 METs to 3.0 METs.  His progress was somewhat limited by his blood pressure responses during exercise.  Pt plans to continue to exercise by walking at home and is considering going to the [facility].  At d/c pt rhythm was sinus.  Thanks for the referral. Fabio Pierce, MA, ACSM RCEP

## 2011-04-14 ENCOUNTER — Encounter (HOSPITAL_COMMUNITY): Payer: Medicare Other

## 2011-05-01 NOTE — Progress Notes (Signed)
Cardiac Rehabilitation Program Progress Report - discharge  I agree with the previous assessment.  Pt would benefit from ongoing support and encouragement with making lasting lifestyle changes.   Pt encouraged to ask questions if something that is said is unclear.  Pt plans to continue his home exercise with walking as able to tolerate with hip, back and neck issues.  Pt had zero hospitalizations during his time in cardiac rehab.

## 2012-12-17 ENCOUNTER — Telehealth: Payer: Self-pay | Admitting: Interventional Cardiology

## 2012-12-17 NOTE — Telephone Encounter (Signed)
New message   bp readings over a few days   139/74, 131/62, 137/65, 139/72 and 138/71

## 2013-03-18 IMAGING — CR DG CHEST 2V
2 series · 2 of 2 positions shown · non-contrast
Comparison: None.

CLINICAL DATA: Preoperative evaluation for CABG.  History of
hypertension, coronary artery disease, diabetes, and history of
smoking in the past.

CHEST - 2 VIEW

[view not recorded (1 of 2)]
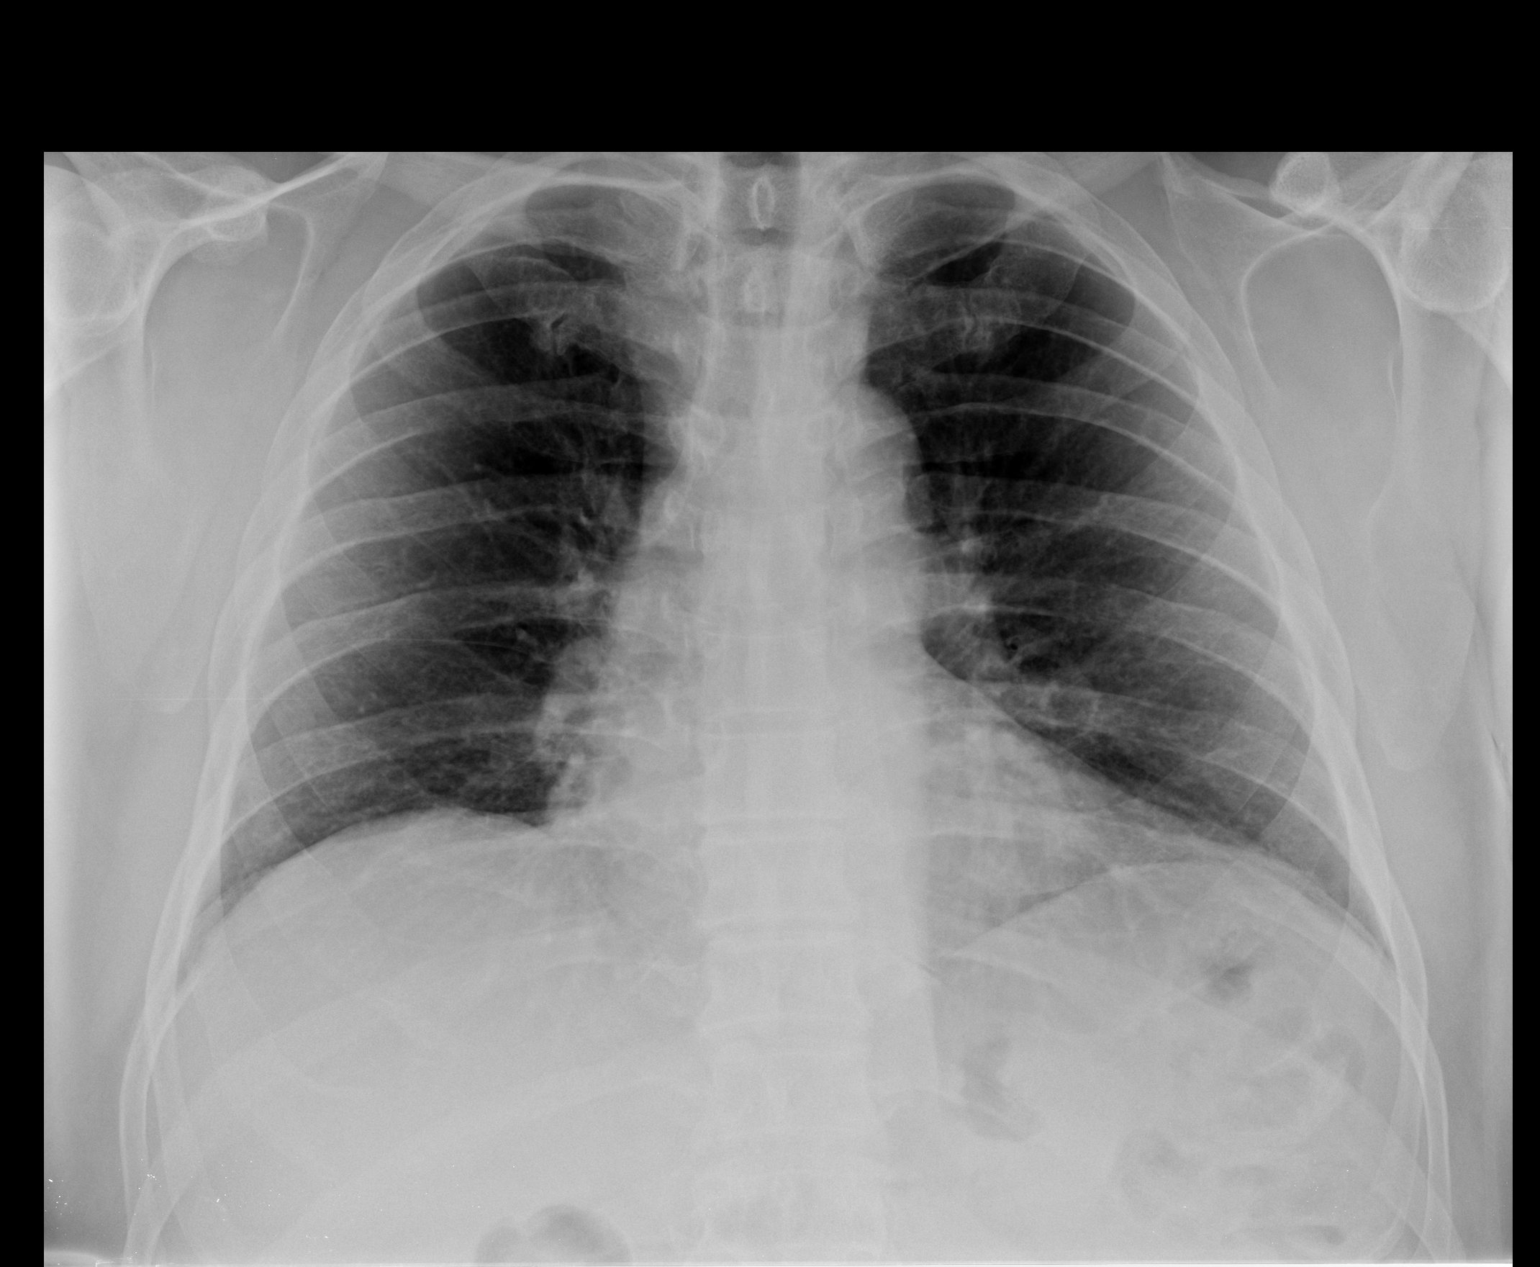

[view not recorded (2 of 2)]
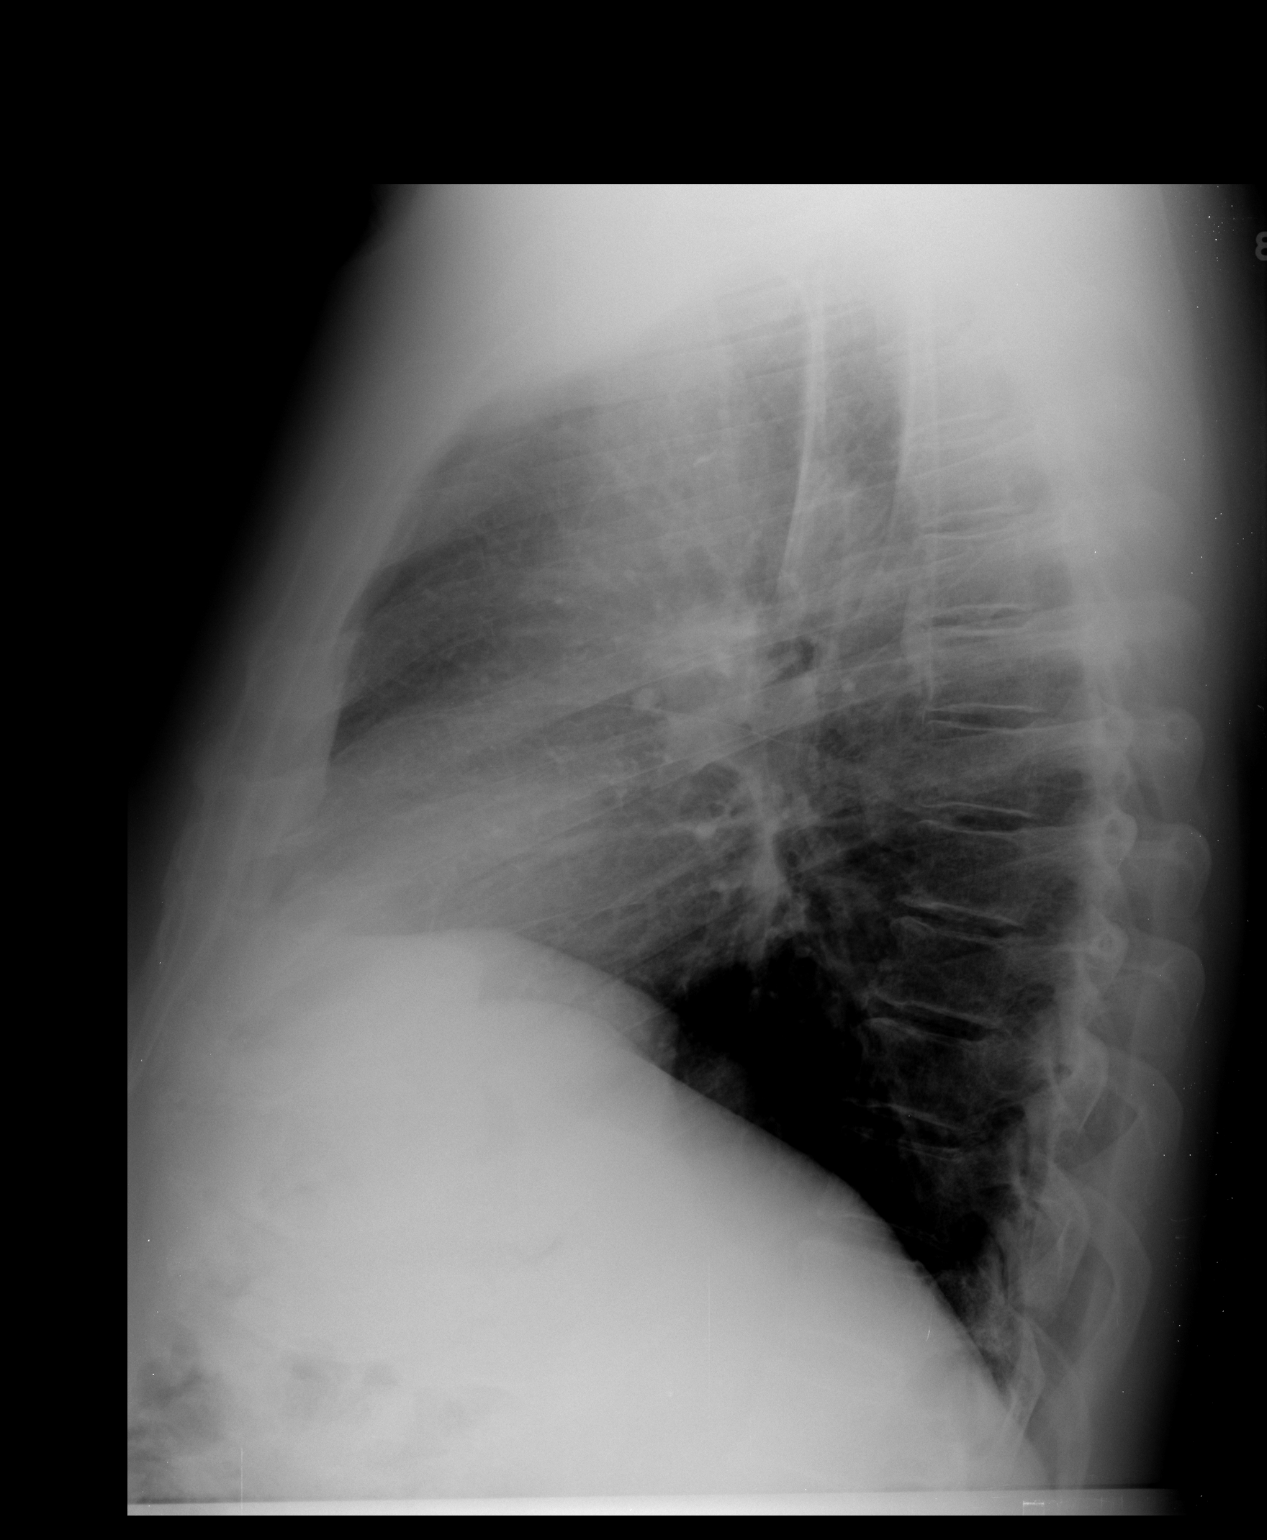

[2 of 2 positions shown; findings below may reference images not displayed]

FINDINGS: The cardiac silhouette is normal size and shape.
Mediastinal and hilar contours appear stable.  Lungs are free of
infiltrates.  No pleural abnormality is evident.  There is minimal
degenerative spondylosis compatible with age.
IMPRESSION: No acute or active cardiopulmonary abnormalities are identified.

## 2013-03-20 IMAGING — CR DG CHEST 1V PORT
1 series · 1 of 1 positions shown · non-contrast
Comparison: 10/25/2010

CLINICAL DATA: Coronary bypass

PORTABLE CHEST - 1 VIEW

[view not recorded]
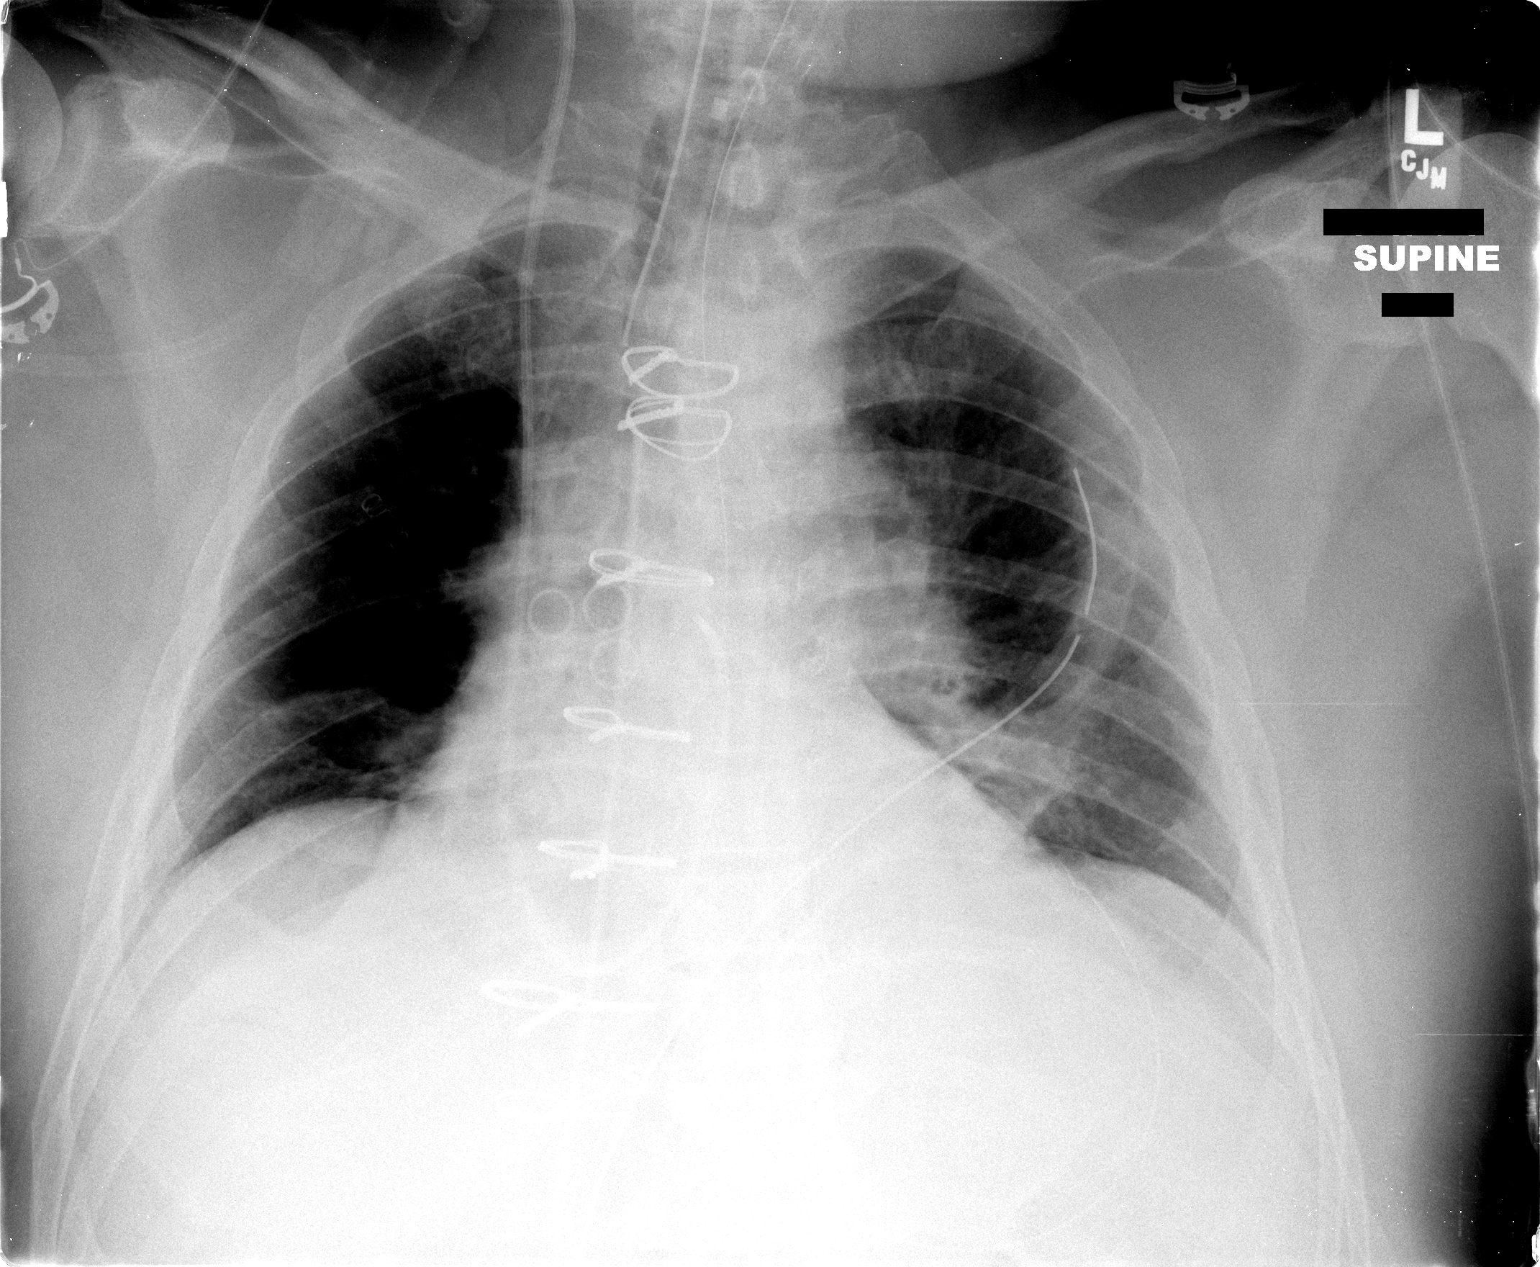

[1 of 1 positions shown; findings below may reference images not displayed]

FINDINGS: Endotracheal tube 5.6 cm above the carina.  NG tube
within the stomach fundus.  Swan-Ganz catheter in the central
pulmonary outflow tract.  Coronary bypass changes evident.  Left
chest tube in place anteriorly.  Heart is enlarged with a prominent
mediastinum, vascular congestion and basilar atelectasis.  No
effusion or pneumothorax.
IMPRESSION: Support apparatus in good position

Low lung volumes with vascular prominence and atelectasis.

## 2013-04-16 IMAGING — CR DG CHEST 2V
2 series · 2 of 2 positions shown · non-contrast
Comparison: 11/01/2010.

CLINICAL DATA: Follow-up CABG.

CHEST - 2 VIEW

[w chest pa]
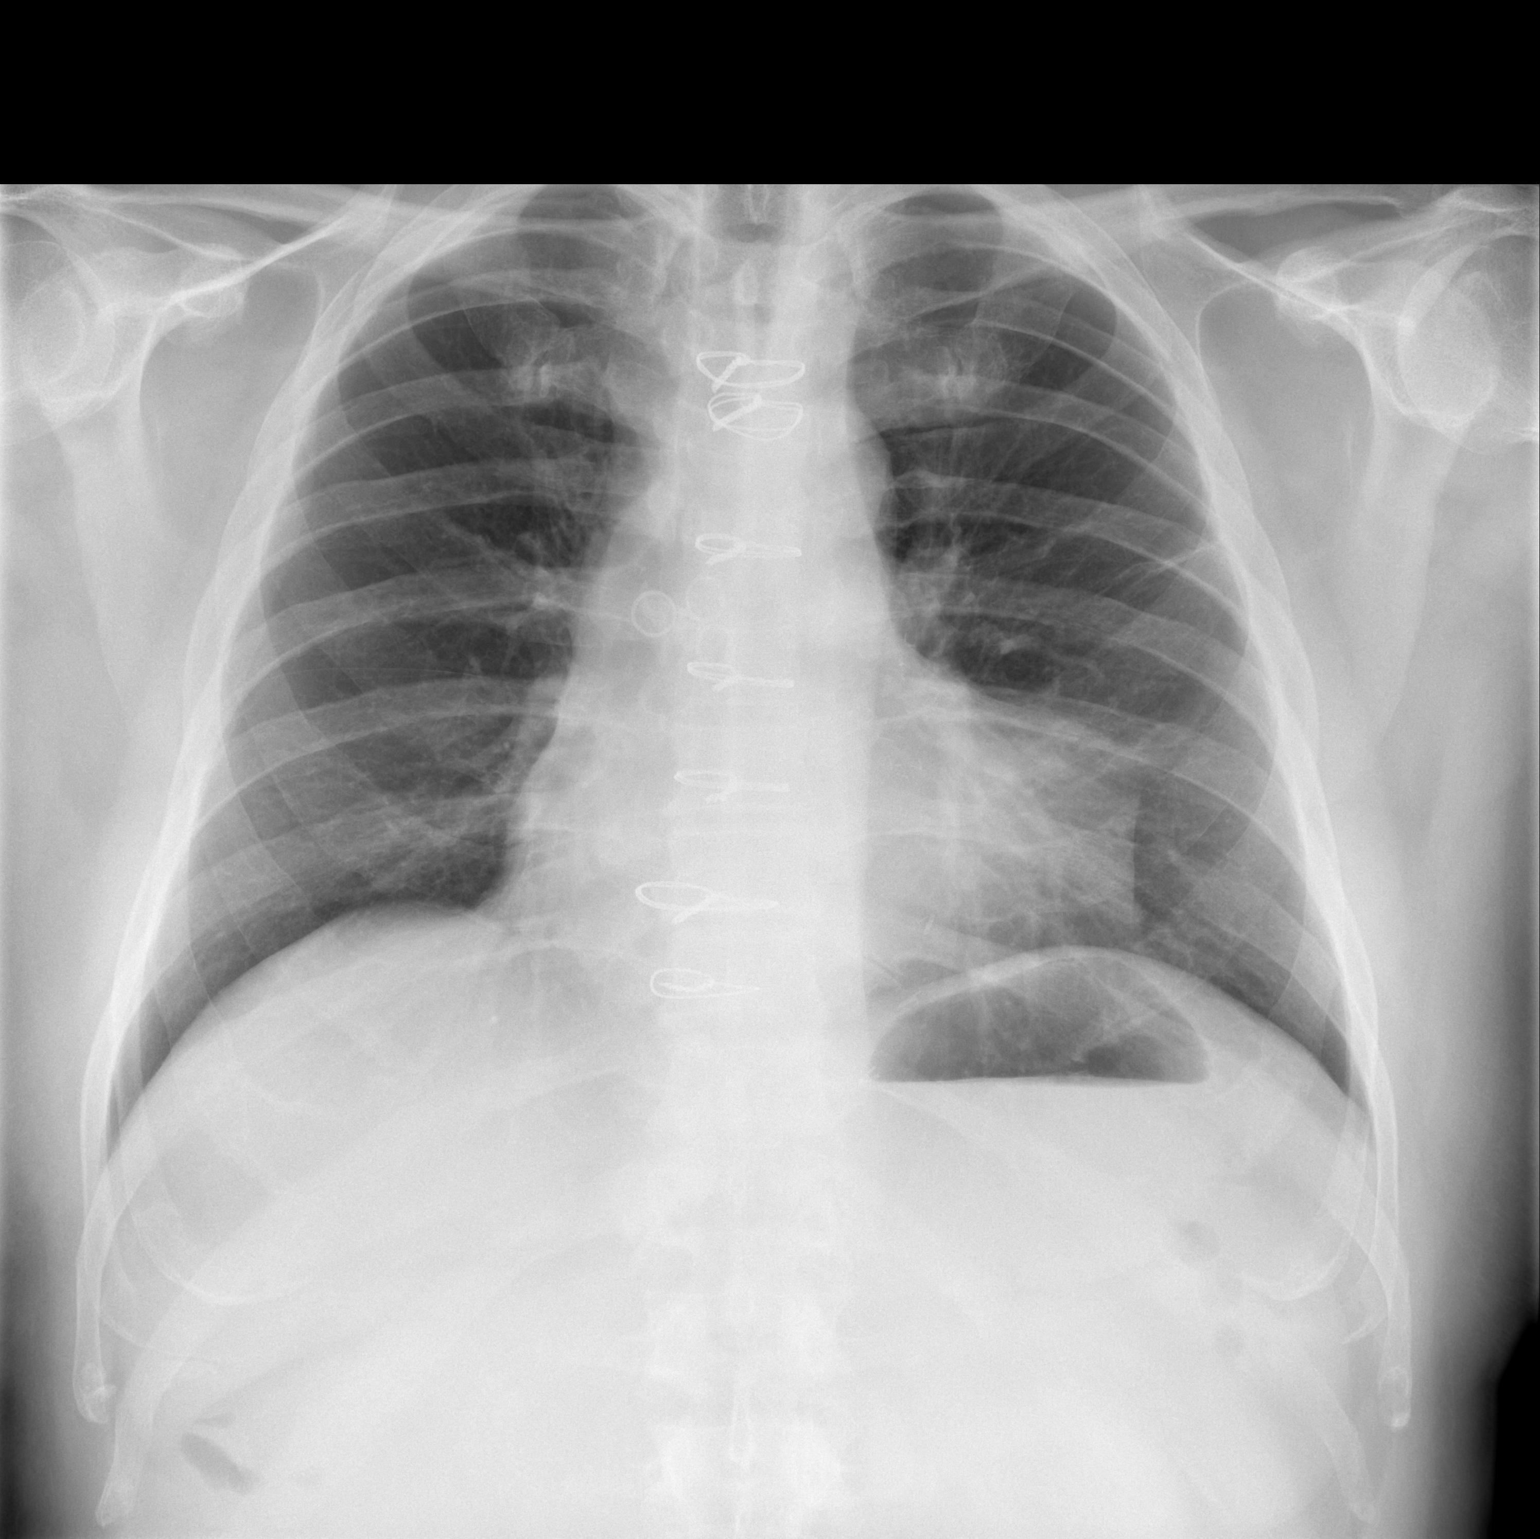

[w chest lat]
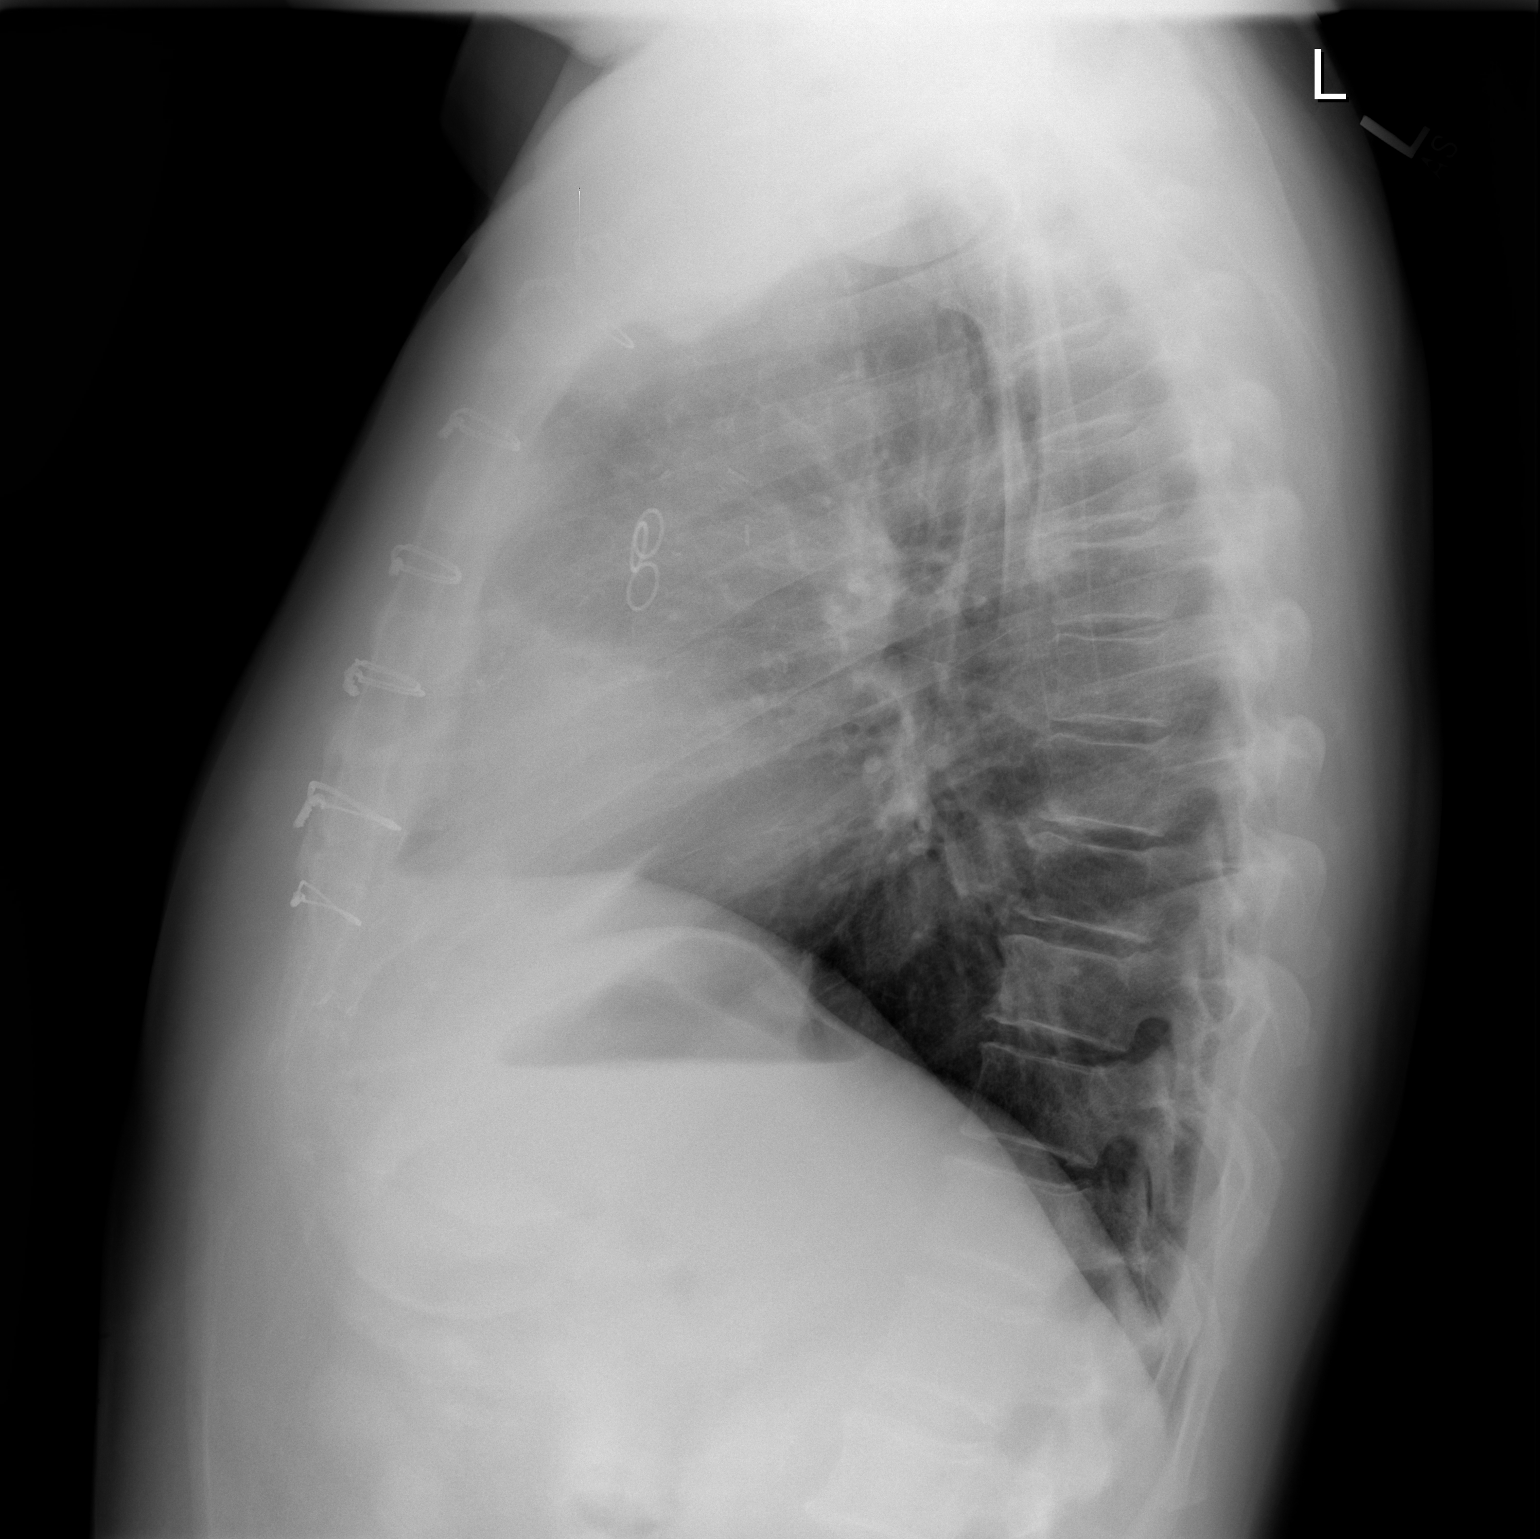

[2 of 2 positions shown; findings below may reference images not displayed]

FINDINGS: The heart size and mediastinal contours are stable status
post CABG.  Bibasilar atelectasis has resolved.  There is no edema,
pleural effusion or pneumothorax.
IMPRESSION: Resolved basilar atelectasis.  No acute cardiopulmonary process.

## 2013-11-04 ENCOUNTER — Encounter: Payer: Self-pay | Admitting: Interventional Cardiology

## 2013-11-04 ENCOUNTER — Ambulatory Visit (INDEPENDENT_AMBULATORY_CARE_PROVIDER_SITE_OTHER): Payer: Medicare HMO | Admitting: Interventional Cardiology

## 2013-11-04 VITALS — BP 132/68 | HR 51 | Ht 68.0 in | Wt 244.0 lb

## 2013-11-04 DIAGNOSIS — E7800 Pure hypercholesterolemia, unspecified: Secondary | ICD-10-CM

## 2013-11-04 DIAGNOSIS — I482 Chronic atrial fibrillation, unspecified: Secondary | ICD-10-CM

## 2013-11-04 DIAGNOSIS — I4891 Unspecified atrial fibrillation: Secondary | ICD-10-CM

## 2013-11-04 DIAGNOSIS — I25810 Atherosclerosis of coronary artery bypass graft(s) without angina pectoris: Secondary | ICD-10-CM

## 2013-11-04 NOTE — Progress Notes (Signed)
Patient ID: Albert Short, male   DOB: 1940-10-26, 73 y.o.   MRN: [id number]    [address], [address] [place], [place]  [address] Phone: [phone] Fax:  [phone]  Date:  11/04/2013   ID:  Albert Short, DOB 02/12/1941, MRN [id number]  PCP:  Henrine Screws, MD   ASSESSMENT:  1. Atrial fibrillation, paroxysmal  2. CAD without angina 3. Hypertension 4. Anticoagulation 5. Hyperlipidemia 6. Sinus bradycardia 7. Tachybradycardia syndrome  PLAN:  1. Continue aerobic activity 2. Clinical followup in one year 3. Call if angina was   SUBJECTIVE: Albert Short is a 73 y.o. male who is doing well and had no cardiac complaints. He denies edema. He has not had syncope. He denies claudication.   Wt Readings from Last 3 Encounters:  11/04/13 244 lb (110.678 kg)  11/23/10 222 lb (100.699 kg)  10/25/10 245 lb (111.131 kg)     Past Medical History  Diagnosis Date  . A-fib     resolved 2006  . MI (myocardial infarction) 04/1986    preserved OV systolic function with apical akinesis   . Hypercholesterolemia   . Obesity   . Diverticulosis   . GERD (gastroesophageal reflux disease)   . CAD (coronary artery disease)   . DM type 2 (diabetes mellitus, type 2)     Current Outpatient Prescriptions  Medication Sig Dispense Refill  . amLODipine (NORVASC) 2.5 MG tablet       . hydrochlorothiazide 25 MG tablet Take 12.5 mg by mouth 2 (two) times daily.       Marland Kitchen losartan (COZAAR) 100 MG tablet Take 100 mg by mouth daily.       . metFORMIN (GLUCOPHAGE) 500 MG tablet Take 500 mg by mouth 2 (two) times daily with a meal.        . metoprolol (LOPRESSOR) 100 MG tablet Take 50 mg by mouth 2 (two) times daily.       . nitroGLYCERIN (NITROSTAT) 0.4 MG SL tablet Place 0.4 mg under the tongue every 5 (five) minutes as needed.        . simvastatin (ZOCOR) 40 MG tablet Take 40 mg by mouth daily at 6 PM.       . traMADol (ULTRAM) 50 MG tablet       . traMADol-acetaminophen  (ULTRACET) 37.5-325 MG per tablet Take 1 tablet by mouth every 6 (six) hours as needed.        . Vitamins A & D (VITAMIN A & D) 10000-400 UNITS CAPS       . warfarin (COUMADIN) 5 MG tablet Take 5 mg by mouth daily. Takes 5 mg  Sunday, Tuesday, Wednesday, Thursday and Saturday Takes 7.5 mg Monday and Friday       No current facility-administered medications for this visit.    Allergies:   No Known Allergies  Social History:  The patient  reports that he quit smoking about 27 years ago. His smoking use included Cigarettes. He smoked 0.00 packs per day. He has never used smokeless tobacco. He reports that he does not drink alcohol or use illicit drugs.   ROS:  Please see the history of present illness.   Obese   All other systems reviewed and negative.   OBJECTIVE: VS:  BP 132/68  Pulse 51  Ht 5\' 8"  (1.727 m)  Wt 244 lb (110.678 kg)  BMI 37.11 kg/m2 Well nourished, well developed, in no acute distress, obese HEENT: normal Neck: JVD flat. Carotid bruit absent  Cardiac:  normal S1, S2; RRR; no murmur Lungs:  clear to auscultation bilaterally, no wheezing, rhonchi or rales Abd: soft, nontender, no hepatomegaly Ext: Edema none. Pulses 2+ and symmetric Skin: warm and dry Neuro:  CNs 2-12 intact, no focal abnormalities noted  EKG:  Sinus bradycardia with nonspecific T wave flattening and precordial T-wave abnormality and evidence of prior septal infarct       Signed, Illene Labrador III, MD 11/04/2013 4:20 PM

## 2013-11-04 NOTE — Patient Instructions (Signed)
Your physician recommends that you continue on your current medications as directed. Please refer to the Current Medication list given to you today.  Your physician wants you to follow-up in: 1 year with Dr.Smith You will receive a reminder letter in the mail two months in advance. If you don't receive a letter, please call our office to schedule the follow-up appointment.  

## 2013-11-14 ENCOUNTER — Encounter: Payer: Self-pay | Admitting: *Deleted

## 2014-03-09 DIAGNOSIS — I739 Peripheral vascular disease, unspecified: Secondary | ICD-10-CM | POA: Diagnosis not present

## 2014-03-09 DIAGNOSIS — Z7901 Long term (current) use of anticoagulants: Secondary | ICD-10-CM | POA: Diagnosis not present

## 2014-03-09 DIAGNOSIS — E119 Type 2 diabetes mellitus without complications: Secondary | ICD-10-CM | POA: Diagnosis not present

## 2014-03-09 DIAGNOSIS — R5383 Other fatigue: Secondary | ICD-10-CM | POA: Diagnosis not present

## 2014-03-09 DIAGNOSIS — I4891 Unspecified atrial fibrillation: Secondary | ICD-10-CM | POA: Diagnosis not present

## 2014-03-09 DIAGNOSIS — I481 Persistent atrial fibrillation: Secondary | ICD-10-CM | POA: Diagnosis not present

## 2014-03-09 DIAGNOSIS — I2510 Atherosclerotic heart disease of native coronary artery without angina pectoris: Secondary | ICD-10-CM | POA: Diagnosis not present

## 2014-03-09 DIAGNOSIS — I10 Essential (primary) hypertension: Secondary | ICD-10-CM | POA: Diagnosis not present

## 2014-03-09 DIAGNOSIS — Z23 Encounter for immunization: Secondary | ICD-10-CM | POA: Diagnosis not present

## 2014-04-23 DIAGNOSIS — Z7901 Long term (current) use of anticoagulants: Secondary | ICD-10-CM | POA: Diagnosis not present

## 2014-04-23 DIAGNOSIS — I10 Essential (primary) hypertension: Secondary | ICD-10-CM | POA: Diagnosis not present

## 2014-04-23 DIAGNOSIS — I4891 Unspecified atrial fibrillation: Secondary | ICD-10-CM | POA: Diagnosis not present

## 2014-04-23 DIAGNOSIS — E119 Type 2 diabetes mellitus without complications: Secondary | ICD-10-CM | POA: Diagnosis not present

## 2014-04-23 DIAGNOSIS — R001 Bradycardia, unspecified: Secondary | ICD-10-CM | POA: Diagnosis not present

## 2014-04-23 DIAGNOSIS — R5383 Other fatigue: Secondary | ICD-10-CM | POA: Diagnosis not present

## 2014-04-23 DIAGNOSIS — I2510 Atherosclerotic heart disease of native coronary artery without angina pectoris: Secondary | ICD-10-CM | POA: Diagnosis not present

## 2014-05-21 DIAGNOSIS — I4891 Unspecified atrial fibrillation: Secondary | ICD-10-CM | POA: Diagnosis not present

## 2014-05-21 DIAGNOSIS — Z7901 Long term (current) use of anticoagulants: Secondary | ICD-10-CM | POA: Diagnosis not present

## 2014-07-09 DIAGNOSIS — Z7901 Long term (current) use of anticoagulants: Secondary | ICD-10-CM | POA: Diagnosis not present

## 2014-11-17 NOTE — Progress Notes (Signed)
Cardiology Office Note   Date:  11/18/2014   ID:  Albert Short, DOB 08-23-1940, MRN [id number]  PCP:  Henrine Screws, MD  Cardiologist:  Sinclair Grooms, MD   Chief Complaint  Patient presents with  . Coronary Artery Disease      History of Present Illness: Albert Short is a 74 y.o. male who presents for CAD with prior bypass, hypertension, diabetes mellitus, history of PAF, and obesity.  [place]on doing well. He denies angina. He has not had syncope. No palpitations. He has not been as active this summer because of the extreme heat and humidity. He has not used sublingual nitroglycerin. He denies neurological complaints.    Past Medical History  Diagnosis Date  . A-fib     resolved 2006  . MI (myocardial infarction) 04/1986    preserved OV systolic function with apical akinesis   . Hypercholesterolemia   . Obesity   . Diverticulosis   . GERD (gastroesophageal reflux disease)   . CAD (coronary artery disease)   . DM type 2 (diabetes mellitus, type 2)     Past Surgical History  Procedure Laterality Date  . Cardiac catheterization  October 17, 1939    Dr Daneen Schick  . Coronary artery bypass grafting x4  10/27/10    [place] Gum     Current Outpatient Prescriptions  Medication Sig Dispense Refill  . amLODipine (NORVASC) 5 MG tablet Take 1 tablet (5 mg total) by mouth daily. 90 tablet 3  . hydrochlorothiazide 25 MG tablet Take 12.5 mg by mouth 2 (two) times daily.     Marland Kitchen losartan (COZAAR) 100 MG tablet Take 100 mg by mouth daily.     . metFORMIN (GLUCOPHAGE) 500 MG tablet Take 500 mg by mouth 2 (two) times daily with a meal.      . metoprolol (LOPRESSOR) 100 MG tablet Take 50 mg by mouth 2 (two) times daily.     . nitroGLYCERIN (NITROSTAT) 0.4 MG SL tablet Place 0.4 mg under the tongue every 5 (five) minutes as needed.      . simvastatin (ZOCOR) 40 MG tablet Take 40 mg by mouth daily at 6 PM.     . traMADol (ULTRAM) 50 MG tablet Take 50 mg by mouth 3 (three)  times daily as needed for moderate pain.     Marland Kitchen warfarin (COUMADIN) 5 MG tablet Take 5 mg by mouth daily. Takes 5 mg  Sunday, Tuesday, Wednesday, Thursday and Saturday Takes 7.5 mg Monday and Friday     No current facility-administered medications for this visit.    Allergies:   Review of patient's allergies indicates no known allergies.    Social History:  The patient  reports that he quit smoking about 28 years ago. His smoking use included Cigarettes. He has never used smokeless tobacco. He reports that he does not drink alcohol or use illicit drugs.   Family History:  The patient's family history includes Heart disease in his brother.    ROS:  Please see the history of present illness.   Otherwise, review of systems are positive for weight gain, decreased physical activity, snoring, constipation, excessive sweating, but otherwise no complaints.   All other systems are reviewed and negative.    PHYSICAL EXAM: VS:  BP 156/74 mmHg  Pulse 47  Ht 5\' 9"  (1.753 m)  Wt 110.732 kg (244 lb 1.9 oz)  BMI 36.03 kg/m2 , BMI Body mass index is 36.03 kg/(m^2). GEN: Well nourished, well developed, in no acute  distress HEENT: normal Neck: no JVD, carotid bruits, or masses Cardiac: RRR.  There is no murmur, rub, or gallop. There is no edema. Respiratory:  clear to auscultation bilaterally, normal work of breathing. GI: soft, nontender, nondistended, + BS MS: no deformity or atrophy Skin: warm and dry, no rash Neuro:  Strength and sensation are intact Psych: euthymic mood, full affect   EKG:  EKG sinus bradycardia at 47 bpm and QS pattern in V1 through V3.  Recent Labs: No results found for requested labs within last 365 days.    Lipid Panel No results found for: CHOL, TRIG, HDL, CHOLHDL, VLDL, LDLCALC, LDLDIRECT    Wt Readings from Last 3 Encounters:  11/18/14 110.732 kg (244 lb 1.9 oz)  11/04/13 110.678 kg (244 lb)  11/23/10 100.699 kg (222 lb)      Other studies  Reviewed: Additional studies/ records that were reviewed today include: Reviewed old records.. The findings include no new data from primary..    ASSESSMENT AND PLAN:  1. Coronary artery disease involving native coronary artery of native heart without angina pectoris Asymptomatic  2. Paroxysmal atrial fibrillation No recent symptomatic recurrences  3. Hypercholesterolemia Followed by primary care  4. Old myocardial infarct Old anteroseptal infarct without recurrence and no history of heart failure. We do need to re-assess LV function given the elevated blood pressure and prior history of infarct to exclude systolic dysfunction. If present this would constitute medical therapy in a slightly different pattern.   5. Essential hypertension  Poor control noted today   Current medicines are reviewed at length with the patient today.  The patient has the following concerns regarding medicines: None.  The following changes/actions have been instituted:    Increase amlodipine to 5 mg per day  2-D Doppler echocardiogram to assess LV systolic function given risk factors for decreased EF  Aerobic activity  Weight loss from increased aerobic activity and decreased caloric intake  Labs/ tests ordered today include:   Orders Placed This Encounter  Procedures  . EKG 12-Lead  . Echocardiogram     Disposition:   FU with HS in 1 year  Signed, Sinclair Grooms, MD  11/18/2014 9:48 AM    [address], [place], [place]  [address] Phone: [phone]; Fax: [phone]

## 2014-11-18 ENCOUNTER — Encounter: Payer: Self-pay | Admitting: Interventional Cardiology

## 2014-11-18 ENCOUNTER — Ambulatory Visit (INDEPENDENT_AMBULATORY_CARE_PROVIDER_SITE_OTHER): Payer: Commercial Managed Care - HMO | Admitting: Interventional Cardiology

## 2014-11-18 ENCOUNTER — Telehealth: Payer: Self-pay | Admitting: Interventional Cardiology

## 2014-11-18 VITALS — BP 156/74 | HR 47 | Ht 69.0 in | Wt 244.1 lb

## 2014-11-18 DIAGNOSIS — E7800 Pure hypercholesterolemia, unspecified: Secondary | ICD-10-CM

## 2014-11-18 DIAGNOSIS — I10 Essential (primary) hypertension: Secondary | ICD-10-CM

## 2014-11-18 DIAGNOSIS — I252 Old myocardial infarction: Secondary | ICD-10-CM | POA: Diagnosis not present

## 2014-11-18 DIAGNOSIS — I2510 Atherosclerotic heart disease of native coronary artery without angina pectoris: Secondary | ICD-10-CM

## 2014-11-18 DIAGNOSIS — I480 Paroxysmal atrial fibrillation: Secondary | ICD-10-CM | POA: Diagnosis not present

## 2014-11-18 MED ORDER — AMLODIPINE BESYLATE 5 MG PO TABS: 5.0000 mg | ORAL_TABLET | Freq: Every day | ORAL | Status: DC

## 2014-11-18 MED ORDER — VITAMIN D3 25 MCG (1000 UT) PO CAPS: 1000.0000 [IU] | ORAL_CAPSULE | Freq: Two times a day (BID) | ORAL | Status: DC

## 2014-11-18 NOTE — Patient Instructions (Addendum)
Medication Instructions:  Your physician has recommended you make the following change in your medication:  INCREASE Amlodipine to 5mg  daily. An Rx has been sent to your pharmacy   Labwork: None orderd  Testing/Procedures: Your physician has requested that you have an echocardiogram. Echocardiography is a painless test that uses sound waves to create images of your heart. It provides your doctor with information about the size and shape of your heart and how well your heart's chambers and valves are working. This procedure takes approximately one hour. There are no restrictions for this procedure.    Follow-Up: Your physician wants you to follow-up in: 1 year with Dr.Smith You will receive a reminder letter in the mail two months in advance. If you don't receive a letter, please call our office to schedule the follow-up appointment.   Any Other Special Instructions Will Be Listed Below (If Applicable). Your physician discussed the importance of regular exercise and recommended that you start or continue a regular exercise program for good health.

## 2014-11-18 NOTE — Telephone Encounter (Signed)
New message     Pt was seen today.  Calling to give name of medication he is taking.  He is taking vitamin D-3 1000mg  twice a day

## 2014-11-18 NOTE — Telephone Encounter (Signed)
Noted pt med list updated

## 2014-11-22 ENCOUNTER — Observation Stay (HOSPITAL_COMMUNITY)
Admission: EM | Admit: 2014-11-22 | Discharge: 2014-11-24 | Disposition: A | Discharge status: Home / Self Care | Payer: Commercial Managed Care - HMO | Attending: Internal Medicine | Admitting: Internal Medicine

## 2014-11-22 ENCOUNTER — Encounter (HOSPITAL_COMMUNITY): Payer: Self-pay | Admitting: *Deleted

## 2014-11-22 ENCOUNTER — Emergency Department (HOSPITAL_COMMUNITY): Payer: Commercial Managed Care - HMO

## 2014-11-22 DIAGNOSIS — E860 Dehydration: Secondary | ICD-10-CM | POA: Diagnosis not present

## 2014-11-22 DIAGNOSIS — E7800 Pure hypercholesterolemia, unspecified: Secondary | ICD-10-CM | POA: Diagnosis present

## 2014-11-22 DIAGNOSIS — N179 Acute kidney failure, unspecified: Secondary | ICD-10-CM | POA: Insufficient documentation

## 2014-11-22 DIAGNOSIS — R42 Dizziness and giddiness: Secondary | ICD-10-CM | POA: Diagnosis not present

## 2014-11-22 DIAGNOSIS — E118 Type 2 diabetes mellitus with unspecified complications: Secondary | ICD-10-CM

## 2014-11-22 DIAGNOSIS — I5032 Chronic diastolic (congestive) heart failure: Secondary | ICD-10-CM | POA: Insufficient documentation

## 2014-11-22 DIAGNOSIS — E669 Obesity, unspecified: Secondary | ICD-10-CM | POA: Diagnosis not present

## 2014-11-22 DIAGNOSIS — R079 Chest pain, unspecified: Secondary | ICD-10-CM | POA: Diagnosis not present

## 2014-11-22 DIAGNOSIS — Z87891 Personal history of nicotine dependence: Secondary | ICD-10-CM | POA: Diagnosis not present

## 2014-11-22 DIAGNOSIS — R0789 Other chest pain: Secondary | ICD-10-CM | POA: Diagnosis not present

## 2014-11-22 DIAGNOSIS — E871 Hypo-osmolality and hyponatremia: Secondary | ICD-10-CM | POA: Insufficient documentation

## 2014-11-22 DIAGNOSIS — K219 Gastro-esophageal reflux disease without esophagitis: Secondary | ICD-10-CM | POA: Insufficient documentation

## 2014-11-22 DIAGNOSIS — E119 Type 2 diabetes mellitus without complications: Secondary | ICD-10-CM | POA: Insufficient documentation

## 2014-11-22 DIAGNOSIS — I252 Old myocardial infarction: Secondary | ICD-10-CM | POA: Diagnosis not present

## 2014-11-22 DIAGNOSIS — I2510 Atherosclerotic heart disease of native coronary artery without angina pectoris: Secondary | ICD-10-CM | POA: Insufficient documentation

## 2014-11-22 DIAGNOSIS — R5383 Other fatigue: Secondary | ICD-10-CM | POA: Diagnosis not present

## 2014-11-22 DIAGNOSIS — N2889 Other specified disorders of kidney and ureter: Secondary | ICD-10-CM | POA: Diagnosis not present

## 2014-11-22 DIAGNOSIS — Z7901 Long term (current) use of anticoagulants: Secondary | ICD-10-CM | POA: Insufficient documentation

## 2014-11-22 DIAGNOSIS — Z23 Encounter for immunization: Secondary | ICD-10-CM | POA: Insufficient documentation

## 2014-11-22 DIAGNOSIS — Z951 Presence of aortocoronary bypass graft: Secondary | ICD-10-CM | POA: Diagnosis not present

## 2014-11-22 DIAGNOSIS — I5022 Chronic systolic (congestive) heart failure: Secondary | ICD-10-CM | POA: Diagnosis present

## 2014-11-22 DIAGNOSIS — I4891 Unspecified atrial fibrillation: Secondary | ICD-10-CM | POA: Insufficient documentation

## 2014-11-22 DIAGNOSIS — R61 Generalized hyperhidrosis: Secondary | ICD-10-CM

## 2014-11-22 DIAGNOSIS — I480 Paroxysmal atrial fibrillation: Secondary | ICD-10-CM | POA: Diagnosis not present

## 2014-11-22 DIAGNOSIS — N390 Urinary tract infection, site not specified: Secondary | ICD-10-CM | POA: Diagnosis not present

## 2014-11-22 DIAGNOSIS — N289 Disorder of kidney and ureter, unspecified: Secondary | ICD-10-CM | POA: Diagnosis present

## 2014-11-22 LAB — PROTIME-INR
INR: 2.21 — AB (ref 0.00–1.49)
Prothrombin Time: 24.4 seconds — ABNORMAL HIGH (ref 11.6–15.2)

## 2014-11-22 LAB — TYPE AND SCREEN
ABO/RH(D): O POS
Antibody Screen: NEGATIVE

## 2014-11-22 LAB — URINE MICROSCOPIC-ADD ON

## 2014-11-22 LAB — I-STAT TROPONIN, ED: TROPONIN I, POC: 0.03 ng/mL (ref 0.00–0.08)

## 2014-11-22 LAB — CBC
HEMATOCRIT: 39.5 % (ref 39.0–52.0)
HEMOGLOBIN: 13.4 g/dL (ref 13.0–17.0)
MCH: 29.9 pg (ref 26.0–34.0)
MCHC: 33.9 g/dL (ref 30.0–36.0)
MCV: 88.2 fL (ref 78.0–100.0)
Platelets: 167 10*3/uL (ref 150–400)
RBC: 4.48 MIL/uL (ref 4.22–5.81)
RDW: 13.1 % (ref 11.5–15.5)
WBC: 11.7 10*3/uL — ABNORMAL HIGH (ref 4.0–10.5)

## 2014-11-22 LAB — URINALYSIS, ROUTINE W REFLEX MICROSCOPIC
GLUCOSE, UA: NEGATIVE mg/dL
KETONES UR: 15 mg/dL — AB
Nitrite: NEGATIVE
PROTEIN: 30 mg/dL — AB
Specific Gravity, Urine: 1.025 (ref 1.005–1.030)
UROBILINOGEN UA: 1 mg/dL (ref 0.0–1.0)
pH: 5 (ref 5.0–8.0)

## 2014-11-22 LAB — BASIC METABOLIC PANEL
ANION GAP: 15 (ref 5–15)
BUN: 22 mg/dL — ABNORMAL HIGH (ref 6–20)
CALCIUM: 8.6 mg/dL — AB (ref 8.9–10.3)
CHLORIDE: 94 mmol/L — AB (ref 101–111)
CO2: 20 mmol/L — AB (ref 22–32)
Creatinine, Ser: 2.23 mg/dL — ABNORMAL HIGH (ref 0.61–1.24)
GFR calc non Af Amer: 27 mL/min — ABNORMAL LOW (ref >60)
GFR, EST AFRICAN AMERICAN: 31 mL/min — AB (ref >60)
GLUCOSE: 157 mg/dL — AB (ref 65–99)
POTASSIUM: 4.1 mmol/L (ref 3.5–5.1)
Sodium: 129 mmol/L — ABNORMAL LOW (ref 135–145)

## 2014-11-22 LAB — BRAIN NATRIURETIC PEPTIDE: B Natriuretic Peptide: 96.5 pg/mL (ref 0.0–100.0)

## 2014-11-22 LAB — HEPATIC FUNCTION PANEL
ALK PHOS: 55 U/L (ref 38–126)
ALT: 20 U/L (ref 17–63)
AST: 20 U/L (ref 15–41)
Albumin: 3.9 g/dL (ref 3.5–5.0)
BILIRUBIN INDIRECT: 1.2 mg/dL — AB (ref 0.3–0.9)
BILIRUBIN TOTAL: 1.5 mg/dL — AB (ref 0.3–1.2)
Bilirubin, Direct: 0.3 mg/dL (ref 0.1–0.5)
TOTAL PROTEIN: 7.3 g/dL (ref 6.5–8.1)

## 2014-11-22 LAB — TROPONIN I: Troponin I: 0.03 ng/mL (ref <0.031)

## 2014-11-22 LAB — CBG MONITORING, ED: Glucose-Capillary: 206 mg/dL — ABNORMAL HIGH (ref 65–99)

## 2014-11-22 LAB — LIPASE, BLOOD: Lipase: 22 U/L (ref 22–51)

## 2014-11-22 MED ORDER — METOPROLOL TARTRATE 50 MG PO TABS
50.0000 mg | ORAL_TABLET | Freq: Two times a day (BID) | ORAL | Status: DC
  Administered 2014-11-23: 50 mg via ORAL
  Filled 2014-11-22 (×2): qty 1
  Filled 2014-11-22: qty 2

## 2014-11-22 MED ORDER — INSULIN ASPART 100 UNIT/ML ~~LOC~~ SOLN
0.0000 [IU] | Freq: Every day | SUBCUTANEOUS | Status: DC
  Administered 2014-11-22: 2 [IU] via SUBCUTANEOUS

## 2014-11-22 MED ORDER — LOSARTAN POTASSIUM 50 MG PO TABS
100.0000 mg | ORAL_TABLET | Freq: Every day | ORAL | Status: DC
  Administered 2014-11-24: 100 mg via ORAL
  Filled 2014-11-22 (×3): qty 2

## 2014-11-22 MED ORDER — ASPIRIN EC 81 MG PO TBEC
81.0000 mg | DELAYED_RELEASE_TABLET | Freq: Every day | ORAL | Status: DC
Start: 2014-11-23 — End: 2014-11-24
  Administered 2014-11-24: 81 mg via ORAL
  Filled 2014-11-22 (×2): qty 1

## 2014-11-22 MED ORDER — SIMVASTATIN 40 MG PO TABS
40.0000 mg | ORAL_TABLET | Freq: Every day | ORAL | Status: DC
  Filled 2014-11-22: qty 1

## 2014-11-22 MED ORDER — DEXTROSE 5 % IV SOLN
1.0000 g | INTRAVENOUS | Status: DC
  Administered 2014-11-22 – 2014-11-23 (×2): 1 g via INTRAVENOUS
  Filled 2014-11-22 (×3): qty 10

## 2014-11-22 MED ORDER — AMLODIPINE BESYLATE 5 MG PO TABS
5.0000 mg | ORAL_TABLET | Freq: Every day | ORAL | Status: DC
  Administered 2014-11-24: 5 mg via ORAL
  Filled 2014-11-22 (×2): qty 1

## 2014-11-22 MED ORDER — TRAMADOL HCL 50 MG PO TABS: 50.0000 mg | ORAL_TABLET | ORAL | Status: DC

## 2014-11-22 MED ORDER — INSULIN ASPART 100 UNIT/ML ~~LOC~~ SOLN
0.0000 [IU] | Freq: Three times a day (TID) | SUBCUTANEOUS | Status: DC
  Administered 2014-11-23: 3 [IU] via SUBCUTANEOUS
  Administered 2014-11-23 – 2014-11-24 (×2): 2 [IU] via SUBCUTANEOUS
  Filled 2014-11-22: qty 1

## 2014-11-22 MED ORDER — ONDANSETRON HCL 4 MG/2ML IJ SOLN: 4.0000 mg | Freq: Four times a day (QID) | INTRAMUSCULAR | Status: DC | PRN

## 2014-11-22 MED ORDER — ACETAMINOPHEN 325 MG PO TABS: 650.0000 mg | ORAL_TABLET | ORAL | Status: DC | PRN

## 2014-11-22 MED ORDER — SODIUM CHLORIDE 0.9 % IV SOLN
INTRAVENOUS | Status: DC
  Administered 2014-11-22: 21:00:00 via INTRAVENOUS

## 2014-11-22 NOTE — ED Notes (Signed)
Off unit with xray. 

## 2014-11-22 NOTE — ED Provider Notes (Signed)
CSN: [id number]     Arrival date & time 11/22/14  1901 History   First MD Initiated Contact with Patient 11/22/14 1921     Chief Complaint  Patient presents with  . Chest Pain     (Consider location/radiation/quality/duration/timing/severity/associated sxs/prior Treatment) HPI She developed chest pain today. He reports it was central and a sharp type of a pressure. It radiated around the left side of his chest. He reports it started to ease up and resolve just about the time he was getting into the EMS truck. He has been diaphoretic and was at that time. He denies that he was short of breath or nauseated with the episode. He however reports that he hasn't felt very well for almost 2 weeks. He has been now experiencing lightheadedness with standing or exertion, extreme fatigue and diaphoresis more than usual for the past several days. He has not specifically had cough or sputum production. He has not had a fever that he's been able to measure. He has not had vomiting or diarrhea but has had loss of appetite. He reports he has not eaten for about the past day and a half. He has not had a skin rash is aware of. Past Medical History  Diagnosis Date  . A-fib     resolved 2006  . MI (myocardial infarction) 04/1986    preserved OV systolic function with apical akinesis   . Hypercholesterolemia   . Obesity   . Diverticulosis   . GERD (gastroesophageal reflux disease)   . CAD (coronary artery disease)   . DM type 2 (diabetes mellitus, type 2)    Past Surgical History  Procedure Laterality Date  . Cardiac catheterization  1939/05/31    Dr Daneen Schick  . Coronary artery bypass grafting x4  10/27/10    [facility]   Family History  Problem Relation Age of Onset  . Heart disease Brother    Social History  Substance Use Topics  . Smoking status: Former Smoker    Types: Cigarettes    Quit date: 04/06/1986  . Smokeless tobacco: Never Used  . Alcohol Use: No     Comment: d/c'ed with MI in 1988     Review of Systems 10 Systems reviewed and are negative for acute change except as noted in the HPI.    Allergies  Review of patient's allergies indicates no known allergies.  Home Medications   Prior to Admission medications   Medication Sig Start Date End Date Taking? Authorizing Provider  amLODipine (NORVASC) 5 MG tablet Take 1 tablet (5 mg total) by mouth daily. 11/18/14  Yes Belva Crome, MD  Cholecalciferol (VITAMIN D3) 1000 UNITS CAPS Take 1 capsule (1,000 Units total) by mouth 2 (two) times daily. 11/18/14  Yes Belva Crome, MD  hydrochlorothiazide 25 MG tablet Take 12.5 mg by mouth 2 (two) times daily.    Yes Historical Provider, MD  losartan (COZAAR) 100 MG tablet Take 100 mg by mouth daily.  09/11/13  Yes Historical Provider, MD  metFORMIN (GLUCOPHAGE) 500 MG tablet Take 500 mg by mouth 2 (two) times daily with a meal.     Yes Historical Provider, MD  metoprolol (LOPRESSOR) 100 MG tablet Take 50 mg by mouth 2 (two) times daily.    Yes Historical Provider, MD  nitroGLYCERIN (NITROSTAT) 0.4 MG SL tablet Place 0.4 mg under the tongue every 5 (five) minutes as needed.     Yes Historical Provider, MD  simvastatin (ZOCOR) 40 MG tablet Take 40 mg  by mouth daily at 6 PM.  09/11/13  Yes Historical Provider, MD  traMADol (ULTRAM) 50 MG tablet Take 50 mg by mouth See admin instructions. Patient takes 50mg  twice daily, then as needed can take addt'l tablet 09/19/13  Yes Historical Provider, MD  warfarin (COUMADIN) 5 MG tablet Take 5 mg by mouth daily at 6 PM. Takes 7.5mg  on sun and wed  Takes 5mg  all other days   Yes Historical Provider, MD   BP 132/62 mmHg  Pulse 54  Temp(Src) 98.7 F (37.1 C) (Oral)  Resp 19  Ht 5\' 9"  (1.753 m)  Wt 244 lb (110.678 kg)  BMI 36.02 kg/m2  SpO2 97% Physical Exam  Constitutional: He is oriented to person, place, and time.  Patient is a moderately obese male. He is alert. He is diaphoretic. He is not in acute respiratory distress. He appears somewhat  pale and mildly ill.  HENT:  Head: Normocephalic and atraumatic.  Nose: Nose normal.  Mouth/Throat: Oropharynx is clear and moist.  Eyes: EOM are normal. Pupils are equal, round, and reactive to light.  Neck: Neck supple.  Cardiovascular: Normal rate, regular rhythm, normal heart sounds and intact distal pulses.   Pulmonary/Chest: Effort normal and breath sounds normal.  Abdominal: Soft. Bowel sounds are normal. He exhibits no distension. There is no tenderness.  Musculoskeletal: Normal range of motion. He exhibits edema. He exhibits no tenderness.  Neurological: He is alert and oriented to person, place, and time. He has normal strength. No cranial nerve deficit. He exhibits normal muscle tone. Coordination normal. GCS eye subscore is 4. GCS verbal subscore is 5. GCS motor subscore is 6.  Skin: Skin is warm, dry and intact. There is pallor.  Diaphoretic  Psychiatric: He has a normal mood and affect.     ED Course  Procedures (including critical care time) Labs Review Labs Reviewed  BASIC METABOLIC PANEL - Abnormal; Notable for the following:    Sodium 129 (*)    Chloride 94 (*)    CO2 20 (*)    Glucose, Bld 157 (*)    BUN 22 (*)    Creatinine, Ser 2.23 (*)    Calcium 8.6 (*)    GFR calc non Af Amer 27 (*)    GFR calc Af Amer 31 (*)    All other components within normal limits  CBC - Abnormal; Notable for the following:    WBC 11.7 (*)    All other components within normal limits  HEPATIC FUNCTION PANEL - Abnormal; Notable for the following:    Total Bilirubin 1.5 (*)    Indirect Bilirubin 1.2 (*)    All other components within normal limits  PROTIME-INR - Abnormal; Notable for the following:    Prothrombin Time 24.4 (*)    INR 2.21 (*)    All other components within normal limits  URINALYSIS, ROUTINE W REFLEX MICROSCOPIC (NOT AT [facility]) - Abnormal; Notable for the following:    APPearance TURBID (*)    Hgb urine dipstick TRACE (*)    Bilirubin Urine MODERATE (*)     Ketones, ur 15 (*)    Protein, ur 30 (*)    Leukocytes, UA MODERATE (*)    All other components within normal limits  URINE MICROSCOPIC-ADD ON - Abnormal; Notable for the following:    Bacteria, UA MANY (*)    Casts HYALINE CASTS (*)    All other components within normal limits  LIPASE, BLOOD  BRAIN NATRIURETIC PEPTIDE  I-STAT TROPOININ, ED  TYPE AND SCREEN    Imaging Review Dg Chest 2 View  11/22/2014   CLINICAL DATA:  Mid sternal chest pain onset this morning lasting 30 min, diaphoresis, coronary artery disease post MI and 4 vessel CABG, type II diabetes mellitus, atrial fibrillation  EXAM: CHEST  2 VIEW  COMPARISON:  11/23/2010  FINDINGS: Borderline enlargement of cardiac silhouette post CABG.  Mediastinal contours and pulmonary vascularity normal.  Lungs clear.  No pulmonary infiltrate, pleural effusion or pneumothorax.  Osseous structures unremarkable.  IMPRESSION: Post CABG.  No acute abnormalities.   Electronically Signed   By: Lavonia Dana M.D.   On: 11/22/2014 20:24   I have personally reviewed and evaluated these images and lab results as part of my medical decision-making.   EKG Interpretation   Date/Time:  Sunday November 22 2014 19:05:12 EDT Ventricular Rate:  68 PR Interval:  168 QRS Duration: 92 QT Interval:  402 QTC Calculation: 427 R Axis:   45 Text Interpretation:  Sinus rhythm Anterior infarct, age indeterminate ST  elevation, consider inferior injury anterior t wave inversion. . No STEMI  Confirmed by Johnney Killian, MD, Jeannie Done [phone]) on 11/22/2014 8:50:22 PM      MDM   Final diagnoses:  Chest pain, unspecified chest pain type  Acute renal insufficiency  Lightheadedness  Other fatigue  Diaphoresis   Patient presents as outlined above. He has had incrementally developing symptoms of fatigue, lightheadedness and diaphoresis. Today he had chest pain that resolved after several hours. Pertinent finding is acute renal insufficiency. The patient will be admitted  for continued fluid hydration and treatment of acute renal insufficiency as well as rule out of MI back/chest pain. Currently there is no evidence of infectious etiology for the patient's symptoms.    Charlesetta Shanks, MD 11/22/14 2141

## 2014-11-22 NOTE — ED Notes (Signed)
Pt attempting to give a urine sample

## 2014-11-22 NOTE — ED Notes (Signed)
Pt  States sitting down when sharp pressure in central chest 8/10 pain, diaphoretic took 4 sub lingual nitro without relief, 324 aspirin, denies SOB/N.  Pt has hx of MI, 1988 and quadrupal bi pass 3 years ago.  EMS150/86, 74, 16, 95% RA, 20 L wrist, NS .

## 2014-11-23 ENCOUNTER — Inpatient Hospital Stay (HOSPITAL_BASED_OUTPATIENT_CLINIC_OR_DEPARTMENT_OTHER): Payer: Commercial Managed Care - HMO

## 2014-11-23 DIAGNOSIS — N390 Urinary tract infection, site not specified: Secondary | ICD-10-CM | POA: Diagnosis not present

## 2014-11-23 DIAGNOSIS — E871 Hypo-osmolality and hyponatremia: Secondary | ICD-10-CM

## 2014-11-23 DIAGNOSIS — R0789 Other chest pain: Secondary | ICD-10-CM

## 2014-11-23 DIAGNOSIS — Z7901 Long term (current) use of anticoagulants: Secondary | ICD-10-CM | POA: Diagnosis not present

## 2014-11-23 DIAGNOSIS — N179 Acute kidney failure, unspecified: Secondary | ICD-10-CM | POA: Diagnosis not present

## 2014-11-23 DIAGNOSIS — I482 Chronic atrial fibrillation: Secondary | ICD-10-CM

## 2014-11-23 DIAGNOSIS — I5022 Chronic systolic (congestive) heart failure: Secondary | ICD-10-CM | POA: Diagnosis present

## 2014-11-23 DIAGNOSIS — I2510 Atherosclerotic heart disease of native coronary artery without angina pectoris: Secondary | ICD-10-CM | POA: Diagnosis not present

## 2014-11-23 DIAGNOSIS — R079 Chest pain, unspecified: Secondary | ICD-10-CM | POA: Diagnosis not present

## 2014-11-23 DIAGNOSIS — I4891 Unspecified atrial fibrillation: Secondary | ICD-10-CM | POA: Diagnosis not present

## 2014-11-23 DIAGNOSIS — E860 Dehydration: Secondary | ICD-10-CM | POA: Diagnosis not present

## 2014-11-23 DIAGNOSIS — K219 Gastro-esophageal reflux disease without esophagitis: Secondary | ICD-10-CM | POA: Diagnosis not present

## 2014-11-23 DIAGNOSIS — I5032 Chronic diastolic (congestive) heart failure: Secondary | ICD-10-CM | POA: Diagnosis present

## 2014-11-23 DIAGNOSIS — I248 Other forms of acute ischemic heart disease: Secondary | ICD-10-CM

## 2014-11-23 DIAGNOSIS — E780 Pure hypercholesterolemia: Secondary | ICD-10-CM | POA: Diagnosis not present

## 2014-11-23 DIAGNOSIS — E119 Type 2 diabetes mellitus without complications: Secondary | ICD-10-CM | POA: Diagnosis not present

## 2014-11-23 LAB — CBC WITH DIFFERENTIAL/PLATELET
BASOS ABS: 0 10*3/uL (ref 0.0–0.1)
BASOS PCT: 0 %
EOS ABS: 0.2 10*3/uL (ref 0.0–0.7)
Eosinophils Relative: 2 %
HEMATOCRIT: 36.9 % — AB (ref 39.0–52.0)
HEMOGLOBIN: 12.8 g/dL — AB (ref 13.0–17.0)
Lymphocytes Relative: 17 %
Lymphs Abs: 1.5 10*3/uL (ref 0.7–4.0)
MCH: 30.6 pg (ref 26.0–34.0)
MCHC: 34.7 g/dL (ref 30.0–36.0)
MCV: 88.3 fL (ref 78.0–100.0)
MONOS PCT: 9 %
Monocytes Absolute: 0.8 10*3/uL (ref 0.1–1.0)
NEUTROS ABS: 6.4 10*3/uL (ref 1.7–7.7)
NEUTROS PCT: 72 %
Platelets: 149 10*3/uL — ABNORMAL LOW (ref 150–400)
RBC: 4.18 MIL/uL — AB (ref 4.22–5.81)
RDW: 13.2 % (ref 11.5–15.5)
WBC: 8.8 10*3/uL (ref 4.0–10.5)

## 2014-11-23 LAB — BASIC METABOLIC PANEL
ANION GAP: 10 (ref 5–15)
BUN: 28 mg/dL — ABNORMAL HIGH (ref 6–20)
CHLORIDE: 100 mmol/L — AB (ref 101–111)
CO2: 25 mmol/L (ref 22–32)
Calcium: 8.8 mg/dL — ABNORMAL LOW (ref 8.9–10.3)
Creatinine, Ser: 1.76 mg/dL — ABNORMAL HIGH (ref 0.61–1.24)
GFR calc Af Amer: 42 mL/min — ABNORMAL LOW (ref >60)
GFR, EST NON AFRICAN AMERICAN: 36 mL/min — AB (ref >60)
GLUCOSE: 154 mg/dL — AB (ref 65–99)
POTASSIUM: 3.2 mmol/L — AB (ref 3.5–5.1)
SODIUM: 135 mmol/L (ref 135–145)

## 2014-11-23 LAB — COMPREHENSIVE METABOLIC PANEL
ALBUMIN: 3.6 g/dL (ref 3.5–5.0)
ALK PHOS: 53 U/L (ref 38–126)
ALT: 18 U/L (ref 17–63)
ANION GAP: 10 (ref 5–15)
AST: 18 U/L (ref 15–41)
BUN: 28 mg/dL — AB (ref 6–20)
CALCIUM: 8.7 mg/dL — AB (ref 8.9–10.3)
CO2: 26 mmol/L (ref 22–32)
Chloride: 99 mmol/L — ABNORMAL LOW (ref 101–111)
Creatinine, Ser: 1.89 mg/dL — ABNORMAL HIGH (ref 0.61–1.24)
GFR calc Af Amer: 38 mL/min — ABNORMAL LOW (ref >60)
GFR calc non Af Amer: 33 mL/min — ABNORMAL LOW (ref >60)
GLUCOSE: 117 mg/dL — AB (ref 65–99)
Potassium: 3.6 mmol/L (ref 3.5–5.1)
SODIUM: 135 mmol/L (ref 135–145)
Total Bilirubin: 0.5 mg/dL (ref 0.3–1.2)
Total Protein: 6.5 g/dL (ref 6.5–8.1)

## 2014-11-23 LAB — TROPONIN I: TROPONIN I: 0.16 ng/mL — AB (ref <0.031)

## 2014-11-23 LAB — CBG MONITORING, ED: GLUCOSE-CAPILLARY: 115 mg/dL — AB (ref 65–99)

## 2014-11-23 LAB — GLUCOSE, CAPILLARY
GLUCOSE-CAPILLARY: 149 mg/dL — AB (ref 65–99)
GLUCOSE-CAPILLARY: 195 mg/dL — AB (ref 65–99)
Glucose-Capillary: 183 mg/dL — ABNORMAL HIGH (ref 65–99)

## 2014-11-23 LAB — PROTIME-INR
INR: 2.42 — ABNORMAL HIGH (ref 0.00–1.49)
Prothrombin Time: 26.1 seconds — ABNORMAL HIGH (ref 11.6–15.2)

## 2014-11-23 MED ORDER — TRAMADOL HCL 50 MG PO TABS
50.0000 mg | ORAL_TABLET | Freq: Two times a day (BID) | ORAL | Status: DC
  Administered 2014-11-23 – 2014-11-24 (×3): 50 mg via ORAL
  Filled 2014-11-23 (×3): qty 1

## 2014-11-23 MED ORDER — WARFARIN - PHARMACIST DOSING INPATIENT: Freq: Every day | Status: DC

## 2014-11-23 MED ORDER — POTASSIUM CHLORIDE CRYS ER 20 MEQ PO TBCR
30.0000 meq | EXTENDED_RELEASE_TABLET | Freq: Once | ORAL | Status: AC
  Administered 2014-11-23: 30 meq via ORAL
  Filled 2014-11-23 (×2): qty 1

## 2014-11-23 MED ORDER — WARFARIN SODIUM 5 MG PO TABS
5.0000 mg | ORAL_TABLET | Freq: Once | ORAL | Status: AC
  Administered 2014-11-23: 5 mg via ORAL
  Filled 2014-11-23 (×2): qty 1

## 2014-11-23 MED ORDER — PERFLUTREN LIPID MICROSPHERE
1.0000 mL | INTRAVENOUS | Status: AC | PRN
  Administered 2014-11-23: 2 mL via INTRAVENOUS
  Filled 2014-11-23: qty 10

## 2014-11-23 MED ORDER — SODIUM CHLORIDE 0.9 % IV SOLN
INTRAVENOUS | Status: DC
  Administered 2014-11-23 – 2014-11-24 (×2): via INTRAVENOUS

## 2014-11-23 MED ORDER — ATORVASTATIN CALCIUM 20 MG PO TABS
20.0000 mg | ORAL_TABLET | Freq: Every day | ORAL | Status: DC
  Administered 2014-11-23: 20 mg via ORAL
  Filled 2014-11-23: qty 1

## 2014-11-23 NOTE — ED Notes (Signed)
Pt expressing concerns of missed medications; reviewed medication list with patient and the times due; requested pharmacy adjust times to patient's schedule

## 2014-11-23 NOTE — Progress Notes (Signed)
  Echocardiogram 2D Echocardiogram with Definity has been performed.  Jennette Dubin 11/23/2014, 9:28 AM

## 2014-11-23 NOTE — Progress Notes (Signed)
PROGRESS NOTE  Albert Short TKP:[id number] DOB: 09-06-1940 DOA: 11/22/2014 PCP: Henrine Screws, MD  HPI/Recap of past 66 hours: 74 year old male with past medical history of age or fibrillation and CAD and diabetes admitted on 9/18 for chest tightness and dizziness and emergency room found to have acute kidney injury in the setting of an acute UTI. Patient seen by cardiology who felt chest pain tightness or noncardiac in nature and troponin which was minimally elevated at 0.6 could be attributed to the renal failure. They recommended treating urinary tract infection and kidney injury and outpatient follow-up.  Patient seen after arrival to floor. He complains a mild headache otherwise no complaints  Assessment/Plan: Principal Problem:   Chest pain atypical, felt to be noncardiac in nature. They be more related to bronchospasm:  Active Problems:   A-fib: Chads 2 Vasc score of 5, on Coumadin, therapeutic   Hypercholesterolemia   GERD (gastroesophageal reflux disease): Stable   CAD (coronary artery disease)   DM type 2 (diabetes mellitus, type 2)   Hyponatremia: Mild, secondary dehydration   AKI (acute kidney injury)Continue hydration, recheck levels:    UTI (lower urinary tract infection): Continue Rocephin, await microbiology    Chronic diastolic heart failure: Noted on echocardiogram. Euvolemic     Code Status: Full code   Family Communication: Left message with wife   Disposition Plan: Anticipate discharge today or tomorrow, once renal function normalized  Consultants:  Cardiology   Procedures:  Echocardiogram done 5/17: Grade 1 diastolic dysfunction   Antibiotics:  IV Rocephin 9/18-present   Objective: BP 122/52 mmHg  Pulse 68  Temp(Src) 97.6 F (36.4 C) (Oral)  Resp 18  Ht 5\' 9"  (1.753 m)  Wt 109.77 kg (242 lb)  BMI 35.72 kg/m2  SpO2 100%  Intake/Output Summary (Last 24 hours) at 11/23/14 1526 Last data filed at 11/23/14 0153  Gross per 24 hour    Intake      0 ml  Output    300 ml  Net   -300 ml   Filed Weights   11/22/14 1909 11/22/14 2218 11/23/14 1119  Weight: 110.678 kg (244 lb) 109.77 kg (242 lb) 109.77 kg (242 lb)    Exam:   General:  Alert and oriented 3, no acute distress   Cardiovascular: Regular rate and rhythm, S1-S2   Respiratory: Clear to auscultation bilaterally   Abdomen: Soft, nontender, nondistended, positive bowel sounds   Musculoskeletal: No Clubbing or cyanosis, trace pitting edema    Data Reviewed: Basic Metabolic Panel:  Recent Labs Lab 11/22/14 2010 11/23/14 0545  NA 129* 135  K 4.1 3.6  CL 94* 99*  CO2 20* 26  GLUCOSE 157* 117*  BUN 22* 28*  CREATININE 2.23* 1.89*  CALCIUM 8.6* 8.7*   Liver Function Tests:  Recent Labs Lab 11/22/14 2001 11/23/14 0545  AST 20 18  ALT 20 18  ALKPHOS 55 53  BILITOT 1.5* 0.5  PROT 7.3 6.5  ALBUMIN 3.9 3.6    Recent Labs Lab 11/22/14 2001  LIPASE 22   No results for input(s): AMMONIA in the last 168 hours. CBC:  Recent Labs Lab 11/22/14 2010 11/23/14 0545  WBC 11.7* 8.8  NEUTROABS  --  6.4  HGB 13.4 12.8*  HCT 39.5 36.9*  MCV 88.2 88.3  PLT 167 149*   Cardiac Enzymes:    Recent Labs Lab 11/22/14 2306 11/23/14 0545 11/23/14 1028  TROPONINI 0.03 0.16* <0.03   BNP (last 3 results)  Recent Labs  11/22/14 2001  BNP  96.5    ProBNP (last 3 results) No results for input(s): PROBNP in the last 8760 hours.  CBG:  Recent Labs Lab 11/22/14 2348 11/23/14 0847  GLUCAP 206* 115*    No results found for this or any previous visit (from the past 240 hour(s)).   Studies: Dg Chest 2 View  11/22/2014   CLINICAL DATA:  Mid sternal chest pain onset this morning lasting 30 min, diaphoresis, coronary artery disease post MI and 4 vessel CABG, type II diabetes mellitus, atrial fibrillation  EXAM: CHEST  2 VIEW  COMPARISON:  11/23/2010  FINDINGS: Borderline enlargement of cardiac silhouette post CABG.  Mediastinal contours and  pulmonary vascularity normal.  Lungs clear.  No pulmonary infiltrate, pleural effusion or pneumothorax.  Osseous structures unremarkable.  IMPRESSION: Post CABG.  No acute abnormalities.   Electronically Signed   By: Lavonia Dana M.D.   On: 11/22/2014 20:24    Scheduled Meds: . amLODipine  5 mg Oral Daily  . aspirin EC  81 mg Oral Daily  . cefTRIAXone (ROCEPHIN)  IV  1 g Intravenous Q24H  . insulin aspart  0-15 Units Subcutaneous TID WC  . insulin aspart  0-5 Units Subcutaneous QHS  . losartan  100 mg Oral Daily  . metoprolol  50 mg Oral BID  . simvastatin  40 mg Oral q1800  . traMADol  50 mg Oral Q12H  . warfarin  5 mg Oral ONCE-1800  . Warfarin - Pharmacist Dosing Inpatient   Does not apply q1800    Continuous Infusions: . sodium chloride       Time spent: 25 minutes  [facility] Pager [phone]. If 7PM-7AM, please contact night-coverage at [email or web address], password [facility] 11/23/2014, 3:26 PM  LOS: 1 day

## 2014-11-23 NOTE — H&P (Addendum)
[facility] History and Physical  Patient: Albert Short  MRN: [id number]  DOB: Oct 02, 1940  DOS: the patient was seen and examined on 11/22/2014 PCP: Henrine Screws, MD  Referring physician: Dr. Vallery Ridge Chief Complaint: Chest pain  HPI: Albert Short is a 74 y.o. male with Past medical history of A. fib, coronary artery disease, dyslipidemia, obesity, GERD, diabetes mellitus type 2. The patient mentions that he has been having generalized weakness over last one week which is progressively worsening. Since last 2-3 days he has also been having increasing episodes of dizziness and lightheadedness with fatigue. He generally has chronic sweating. He denies having any nausea vomiting diarrhea abdominal pain. Today he started having complaints of chest pain which is located in the center of the chest and felt like tightness associated with shortness of breath. He denies any recent travel recent surgeries and procedures. He denies having any similar symptoms in the past. He called EMS and by the time EMS arrived the patient's chest pain was resolved. But it is difficult risk factor the patient was referred for admission. He denies any complaints of focal deficit weakness or numbness. He denies having any loss of control of bowel or bladder. Next and although he complains that since last 2 days he has been having increasing burning urination without any blood.  The patient is coming from home  At his baseline ambulates without any support And is independent for most of his ADL; manages his medication on his own.  Review of Systems: as mentioned in the history of present illness.  A comprehensive review of the other systems is negative.  Past Medical History  Diagnosis Date  . A-fib     resolved 2006  . MI (myocardial infarction) 04/1986    preserved OV systolic function with apical akinesis   . Hypercholesterolemia   . Obesity   . Diverticulosis   . GERD  (gastroesophageal reflux disease)   . CAD (coronary artery disease)   . DM type 2 (diabetes mellitus, type 2)    Past Surgical History  Procedure Laterality Date  . Cardiac catheterization  1940-02-24    Dr Daneen Schick  . Coronary artery bypass grafting x4  10/27/10    [facility] [facility]   Social History:  reports that he quit smoking about 28 years ago. His smoking use included Cigarettes. He has never used smokeless tobacco. He reports that he does not drink alcohol or use illicit drugs.  No Known Allergies  Family History  Problem Relation Age of Onset  . Heart disease Brother     Prior to Admission medications   Medication Sig Start Date End Date Taking? Authorizing Provider  amLODipine (NORVASC) 5 MG tablet Take 1 tablet (5 mg total) by mouth daily. 11/18/14  Yes Belva Crome, MD  Cholecalciferol (VITAMIN D3) 1000 UNITS CAPS Take 1 capsule (1,000 Units total) by mouth 2 (two) times daily. 11/18/14  Yes Belva Crome, MD  hydrochlorothiazide 25 MG tablet Take 12.5 mg by mouth 2 (two) times daily.    Yes Historical Provider, MD  losartan (COZAAR) 100 MG tablet Take 100 mg by mouth daily.  09/11/13  Yes Historical Provider, MD  metFORMIN (GLUCOPHAGE) 500 MG tablet Take 500 mg by mouth 2 (two) times daily with a meal.     Yes Historical Provider, MD  metoprolol (LOPRESSOR) 100 MG tablet Take 50 mg by mouth 2 (two) times daily.    Yes Historical Provider, MD  nitroGLYCERIN (NITROSTAT) 0.4 MG SL tablet  Place 0.4 mg under the tongue every 5 (five) minutes as needed.     Yes Historical Provider, MD  simvastatin (ZOCOR) 40 MG tablet Take 40 mg by mouth daily at 6 PM.  09/11/13  Yes Historical Provider, MD  traMADol (ULTRAM) 50 MG tablet Take 50 mg by mouth See admin instructions. Patient takes 50mg  twice daily, then as needed can take addt'l tablet 09/19/13  Yes Historical Provider, MD  warfarin (COUMADIN) 5 MG tablet Take 5 mg by mouth daily at 6 PM. Takes 7.5mg  on sun and wed  Takes 5mg  all other days    Yes Historical Provider, MD    Physical Exam: Filed Vitals:   11/23/14 0030 11/23/14 0100 11/23/14 0223 11/23/14 0500  BP: 121/46 109/47 111/69 121/53  Pulse: 48 53 54 58  Temp:      TempSrc:      Resp: 15 15 22 20   Height:      Weight:      SpO2: 96% 96% 97% 97%    General: Alert, Awake and Oriented to Time, Place and Person. Appear in mild distress Eyes: PERRL ENT: Oral Mucosa clear moist. Neck: no JVD Cardiovascular: S1 and S2 Present, no Murmur, Peripheral Pulses Present Respiratory: Bilateral Air entry equal and Decreased,  Clear to Auscultation, no Crackles, no wheezes Abdomen: Bowel Sound present, Soft and no tenderness Skin: no Rash Extremities: no Pedal edema, no calf tenderness Neurologic: Grossly no focal neuro deficit.  Labs on Admission:  CBC:  Recent Labs Lab 11/22/14 2010  WBC 11.7*  HGB 13.4  HCT 39.5  MCV 88.2  PLT 167    CMP     Component Value Date/Time   NA 129* 11/22/2014 2010   K 4.1 11/22/2014 2010   CL 94* 11/22/2014 2010   CO2 20* 11/22/2014 2010   GLUCOSE 157* 11/22/2014 2010   BUN 22* 11/22/2014 2010   CREATININE 2.23* 11/22/2014 2010   CALCIUM 8.6* 11/22/2014 2010   PROT 7.3 11/22/2014 2001   ALBUMIN 3.9 11/22/2014 2001   AST 20 11/22/2014 2001   ALT 20 11/22/2014 2001   ALKPHOS 55 11/22/2014 2001   BILITOT 1.5* 11/22/2014 2001   GFRNONAA 27* 11/22/2014 2010   GFRAA 31* 11/22/2014 2010     Recent Labs Lab 11/22/14 2306  TROPONINI 0.03   BNP (last 3 results)  Recent Labs  11/22/14 2001  BNP 96.5    ProBNP (last 3 results) No results for input(s): PROBNP in the last 8760 hours.   Radiological Exams on Admission: Dg Chest 2 View  11/22/2014   CLINICAL DATA:  Mid sternal chest pain onset this morning lasting 30 min, diaphoresis, coronary artery disease post MI and 4 vessel CABG, type II diabetes mellitus, atrial fibrillation  EXAM: CHEST  2 VIEW  COMPARISON:  11/23/2010  FINDINGS: Borderline enlargement of  cardiac silhouette post CABG.  Mediastinal contours and pulmonary vascularity normal.  Lungs clear.  No pulmonary infiltrate, pleural effusion or pneumothorax.  Osseous structures unremarkable.  IMPRESSION: Post CABG.  No acute abnormalities.   Electronically Signed   By: Lavonia Dana M.D.   On: 11/22/2014 20:24   EKG: Independently reviewed. normal sinus rhythm, nonspecific ST and T waves changes.  Assessment/Plan 1. Chest pain Patient presents with complaints of chest pain. Currently chest pain-free. He has significant risk factors. Initial EKG and troponins are unremarkable. With this the patient will be admitted in the telemetry unit. Monitor serial troponin and echo program in the morning. Patient remains nothing by  mouth after midnight. We will consult cardio in the morning. Continue aspirin as well as statins.  2.A-fib Currently sinus with nonspecific ST-T wave changes. Serial troponin negative. Continue metoprolol as well as a warfarin.  3. Hypercholesterolemia Continue simvastatin.  4. GERD (gastroesophageal reflux disease) Currently not on any PPI. Continue close monitoring.  5.CAD (coronary artery disease) Adding 81 mg aspirin. Continue rest of the home medications.  6.DM type 2 (diabetes mellitus, type 2) Holding oral metformin and placing him on sliding scale checking hemoglobin A1c.  7.Hyponatremia Etiology currently unclear but in the setting of hyponatremia with mild worsening of renal function currently holding hydrochlorothiazide.  8.AKI (acute kidney injury) Most likely with ongoing infection. We will hold hydrochlorothiazide. Bladder scan requested in the ER. Renal function if does not improve will require formal ultrasound.  9. UTI (lower urinary tract infection) Burning urination with pyuria. Acute kidney injury is also present. This may represent a UTI. We will treat the patient with ceftriaxone. A renal function does not improve he will require  further imaging.  Nutrition: Nothing by mouth after midnight cardiac and diabetic diet DVT Prophylaxis: on therapeutic anticoagulation.  Advance goals of care discussion: Full code   Consults: Cardiology Family Communication: family was present at bedside, opportunity was given to ask question and all questions were answered satisfactorily at the time of interview. Disposition: Admitted as observation, telemetry unit. Estimated length of stay: One to 3 day depending on renal function  Author: Berle Mull, MD [facility] Pager: [phone] 11/22/2014  If 7PM-7AM, please contact night-coverage [email or web address] Password [email or web address]

## 2014-11-23 NOTE — ED Notes (Signed)
Echo technician at bedside at this time performing echocardiogram on patient

## 2014-11-23 NOTE — ED Notes (Signed)
[facility] p[facility] for elevated troponin; cardiology to see patient

## 2014-11-23 NOTE — Consult Note (Signed)
CONSULTATION NOTE  Reason for Consult: Chest pain  Requesting Physician: Dr. Maryland Pink  Cardiologist: Dr. Tamala Julian  HPI: This is a 74 y.o. male with a past medical history significant for disease with prior CABG 4 in 2012, hypertension, obesity, type 2 diabetes, paroxysmal atrial fibrillation on warfarin, and prior acute MI with apical akinesis. He was recently seen by his cardiologist last week and was asymptomatic at that time. His amlodipine was increased to 5 mg daily for elevated blood pressure. An echocardiogram was ordered, but has not yet been performed. He now presented yesterday with chest pain. He apparently has had generalized weakness over the past week and a last 2-3 days has been having episodes of dizziness, lightheadedness and fatigue. He reported central chest tightness with associated shortness of breath. Apparently his chest pain had resolved by the time EMS arrived. He was also reporting dysuria and cloudiness in the urine which has been going on for several days. Urinalysis indicates probable urinary tract infection with multiple bacteria, hyaline casts, trace hemoglobin and moderate leukocytes. It was nitrite negative. He's been placed on ceftriaxone for treatment.  PMHx:  Past Medical History  Diagnosis Date  . A-fib     resolved 2006  . MI (myocardial infarction) 04/1986    preserved OV systolic function with apical akinesis   . Hypercholesterolemia   . Obesity   . Diverticulosis   . GERD (gastroesophageal reflux disease)   . CAD (coronary artery disease)   . DM type 2 (diabetes mellitus, type 2)    Past Surgical History  Procedure Laterality Date  . Cardiac catheterization  01/26/40    Dr Daneen Schick  . Coronary artery bypass grafting x4  10/27/10    [place] Gum    FAMHx: Family History  Problem Relation Age of Onset  . Heart disease Brother     SOCHx:  reports that he quit smoking about 28 years ago. His smoking use included Cigarettes. He has  never used smokeless tobacco. He reports that he does not drink alcohol or use illicit drugs.  ALLERGIES: No Known Allergies  ROS: A comprehensive review of systems was negative except for: Constitutional: positive for fatigue Respiratory: positive for dyspnea on exertion Cardiovascular: positive for chest pain  HOME MEDICATIONS:  (Not in a hospital admission)  HOSPITAL MEDICATIONS: Prior to Admission:  (Not in a hospital admission)  VITALS: Blood pressure 123/46, pulse 58, temperature 98.7 F (37.1 C), temperature source Oral, resp. rate 20, height '5\' 9"'  (1.753 m), weight 242 lb (109.77 kg), SpO2 97 %.  PHYSICAL EXAM: General appearance: alert and no distress Neck: no carotid bruit and no JVD Lungs: clear to auscultation bilaterally Heart: regular rate and rhythm, S1, S2 normal, no murmur, click, rub or gallop Abdomen: soft, non-tender; bowel sounds normal; no masses,  no organomegaly Extremities: extremities normal, atraumatic, no cyanosis or edema Pulses: 2+ and symmetric Skin: Skin color, texture, turgor normal. No rashes or lesions Neurologic: Grossly normal Psych: Pleasant  LABS: Results for orders placed or performed during the hospital encounter of 11/22/14 (from the past 48 hour(s))  Hepatic function panel     Status: Abnormal   Collection Time: 11/22/14  8:01 PM  Result Value Ref Range   Total Protein 7.3 6.5 - 8.1 g/dL   Albumin 3.9 3.5 - 5.0 g/dL   AST 20 15 - 41 U/L   ALT 20 17 - 63 U/L   Alkaline Phosphatase 55 38 - 126 U/L   Total Bilirubin 1.5 (H)  0.3 - 1.2 mg/dL   Bilirubin, Direct 0.3 0.1 - 0.5 mg/dL   Indirect Bilirubin 1.2 (H) 0.3 - 0.9 mg/dL  Lipase, blood     Status: None   Collection Time: 11/22/14  8:01 PM  Result Value Ref Range   Lipase 22 22 - 51 U/L  Brain natriuretic peptide     Status: None   Collection Time: 11/22/14  8:01 PM  Result Value Ref Range   B Natriuretic Peptide 96.5 0.0 - 100.0 pg/mL  Protime-INR     Status: Abnormal    Collection Time: 11/22/14  8:01 PM  Result Value Ref Range   Prothrombin Time 24.4 (H) 11.6 - 15.2 seconds   INR 2.21 (H) 0.00 - 1.49  Type and screen     Status: None   Collection Time: 11/22/14  8:01 PM  Result Value Ref Range   ABO/RH(D) O POS    Antibody Screen NEG    Sample Expiration 32/02/2481   Basic metabolic panel     Status: Abnormal   Collection Time: 11/22/14  8:10 PM  Result Value Ref Range   Sodium 129 (L) 135 - 145 mmol/L   Potassium 4.1 3.5 - 5.1 mmol/L   Chloride 94 (L) 101 - 111 mmol/L   CO2 20 (L) 22 - 32 mmol/L   Glucose, Bld 157 (H) 65 - 99 mg/dL   BUN 22 (H) 6 - 20 mg/dL   Creatinine, Ser 2.23 (H) 0.61 - 1.24 mg/dL   Calcium 8.6 (L) 8.9 - 10.3 mg/dL   GFR calc non Af Amer 27 (L) >60 mL/min   GFR calc Af Amer 31 (L) >60 mL/min    Comment: (NOTE) The eGFR has been calculated using the CKD EPI equation. This calculation has not been validated in all clinical situations. eGFR's persistently <60 mL/min signify possible Chronic Kidney Disease.    Anion gap 15 5 - 15  CBC     Status: Abnormal   Collection Time: 11/22/14  8:10 PM  Result Value Ref Range   WBC 11.7 (H) 4.0 - 10.5 K/uL   RBC 4.48 4.22 - 5.81 MIL/uL   Hemoglobin 13.4 13.0 - 17.0 g/dL   HCT 39.5 39.0 - 52.0 %   MCV 88.2 78.0 - 100.0 fL   MCH 29.9 26.0 - 34.0 pg   MCHC 33.9 30.0 - 36.0 g/dL   RDW 13.1 11.5 - 15.5 %   Platelets 167 150 - 400 K/uL  I-stat troponin, ED     Status: None   Collection Time: 11/22/14  8:20 PM  Result Value Ref Range   Troponin i, poc 0.03 0.00 - 0.08 ng/mL   Comment 3            Comment: Due to the release kinetics of cTnI, a negative result within the first hours of the onset of symptoms does not rule out myocardial infarction with certainty. If myocardial infarction is still suspected, repeat the test at appropriate intervals.   Urinalysis, Routine w reflex microscopic     Status: Abnormal   Collection Time: 11/22/14  8:55 PM  Result Value Ref Range    Color, Urine YELLOW YELLOW   APPearance TURBID (A) CLEAR   Specific Gravity, Urine 1.025 1.005 - 1.030   pH 5.0 5.0 - 8.0   Glucose, UA NEGATIVE NEGATIVE mg/dL   Hgb urine dipstick TRACE (A) NEGATIVE   Bilirubin Urine MODERATE (A) NEGATIVE   Ketones, ur 15 (A) NEGATIVE mg/dL   Protein, ur 30 (A)  NEGATIVE mg/dL   Urobilinogen, UA 1.0 0.0 - 1.0 mg/dL   Nitrite NEGATIVE NEGATIVE   Leukocytes, UA MODERATE (A) NEGATIVE  Urine microscopic-add on     Status: Abnormal   Collection Time: 11/22/14  8:55 PM  Result Value Ref Range   Squamous Epithelial / LPF RARE RARE   WBC, UA 21-50 <3 WBC/hpf   RBC / HPF 0-2 <3 RBC/hpf   Bacteria, UA MANY (A) RARE   Casts HYALINE CASTS (A) NEGATIVE  Troponin I (q 6hr x 3)     Status: None   Collection Time: 11/22/14 11:06 PM  Result Value Ref Range   Troponin I 0.03 <0.031 ng/mL    Comment:        NO INDICATION OF MYOCARDIAL INJURY.   CBG monitoring, ED     Status: Abnormal   Collection Time: 11/22/14 11:48 PM  Result Value Ref Range   Glucose-Capillary 206 (H) 65 - 99 mg/dL  Troponin I (q 6hr x 3)     Status: Abnormal   Collection Time: 11/23/14  5:45 AM  Result Value Ref Range   Troponin I 0.16 (H) <0.031 ng/mL    Comment:        PERSISTENTLY INCREASED TROPONIN VALUES IN THE RANGE OF 0.04-0.49 ng/mL CAN BE SEEN IN:       -UNSTABLE ANGINA       -CONGESTIVE HEART FAILURE       -MYOCARDITIS       -CHEST TRAUMA       -ARRYHTHMIAS       -LATE PRESENTING MYOCARDIAL INFARCTION       -COPD   CLINICAL FOLLOW-UP RECOMMENDED.   CBC with Differential/Platelet     Status: Abnormal   Collection Time: 11/23/14  5:45 AM  Result Value Ref Range   WBC 8.8 4.0 - 10.5 K/uL   RBC 4.18 (L) 4.22 - 5.81 MIL/uL   Hemoglobin 12.8 (L) 13.0 - 17.0 g/dL   HCT 36.9 (L) 39.0 - 52.0 %   MCV 88.3 78.0 - 100.0 fL   MCH 30.6 26.0 - 34.0 pg   MCHC 34.7 30.0 - 36.0 g/dL   RDW 13.2 11.5 - 15.5 %   Platelets 149 (L) 150 - 400 K/uL   Neutrophils Relative % 72 %    Neutro Abs 6.4 1.7 - 7.7 K/uL   Lymphocytes Relative 17 %   Lymphs Abs 1.5 0.7 - 4.0 K/uL   Monocytes Relative 9 %   Monocytes Absolute 0.8 0.1 - 1.0 K/uL   Eosinophils Relative 2 %   Eosinophils Absolute 0.2 0.0 - 0.7 K/uL   Basophils Relative 0 %   Basophils Absolute 0.0 0.0 - 0.1 K/uL  Comprehensive metabolic panel     Status: Abnormal   Collection Time: 11/23/14  5:45 AM  Result Value Ref Range   Sodium 135 135 - 145 mmol/L   Potassium 3.6 3.5 - 5.1 mmol/L   Chloride 99 (L) 101 - 111 mmol/L   CO2 26 22 - 32 mmol/L   Glucose, Bld 117 (H) 65 - 99 mg/dL   BUN 28 (H) 6 - 20 mg/dL   Creatinine, Ser 1.89 (H) 0.61 - 1.24 mg/dL   Calcium 8.7 (L) 8.9 - 10.3 mg/dL   Total Protein 6.5 6.5 - 8.1 g/dL   Albumin 3.6 3.5 - 5.0 g/dL   AST 18 15 - 41 U/L   ALT 18 17 - 63 U/L   Alkaline Phosphatase 53 38 - 126 U/L   Total Bilirubin  0.5 0.3 - 1.2 mg/dL   GFR calc non Af Amer 33 (L) >60 mL/min   GFR calc Af Amer 38 (L) >60 mL/min    Comment: (NOTE) The eGFR has been calculated using the CKD EPI equation. This calculation has not been validated in all clinical situations. eGFR's persistently <60 mL/min signify possible Chronic Kidney Disease.    Anion gap 10 5 - 15  Protime-INR     Status: Abnormal   Collection Time: 11/23/14  5:45 AM  Result Value Ref Range   Prothrombin Time 26.1 (H) 11.6 - 15.2 seconds   INR 2.42 (H) 0.00 - 1.49    IMAGING: Dg Chest 2 View  11/22/2014   CLINICAL DATA:  Mid sternal chest pain onset this morning lasting 30 min, diaphoresis, coronary artery disease post MI and 4 vessel CABG, type II diabetes mellitus, atrial fibrillation  EXAM: CHEST  2 VIEW  COMPARISON:  11/23/2010  FINDINGS: Borderline enlargement of cardiac silhouette post CABG.  Mediastinal contours and pulmonary vascularity normal.  Lungs clear.  No pulmonary infiltrate, pleural effusion or pneumothorax.  Osseous structures unremarkable.  IMPRESSION: Post CABG.  No acute abnormalities.    Electronically Signed   By: Lavonia Dana M.D.   On: 11/22/2014 20:24    HOSPITAL DIAGNOSES: Principal Problem:   Chest pain Active Problems:   A-fib   Hypercholesterolemia   GERD (gastroesophageal reflux disease)   CAD (coronary artery disease)   DM type 2 (diabetes mellitus, type 2)   Hyponatremia   AKI (acute kidney injury)   UTI (lower urinary tract infection)   IMPRESSION: 1. Elevated troponin-likely demand ischemia 2. Urinary tract infection 3. Acute on chronic renal failure 4. Leukocytosis - resolving  RECOMMENDATION: 1. Mr. Wiacek has had one week of progressive fatigue and weakness which started around the time he saw his cardiologist in the office. Subsequently has been found to have acute renal failure likely due to dehydration in the setting of urinary tract infection. He was started on ceftriaxone with marked improvement overnight and reports his urine is cleared up today. He denies any further dysuria. His leukocytosis is resolved. Renal function is improving. His troponin was mildly elevated at 0.16. This could represent demand ischemia given his known coronary artery bypass grafting. He's not had any more chest pain since prior to calling the ambulance this weekend. I would not recommend any further workup at this time as his renal failure is still resolving and he is therapeutically anticoagulated on warfarin. Outpatient stress testing may be indicated at the discretion of his cardiologist.  Thank you for the consultation.  Time Spent Directly with Patient: 30 minutes  Pixie Casino, MD, [facility] Attending Cardiologist [place] 11/23/2014, 7:37 AM

## 2014-11-23 NOTE — Progress Notes (Addendum)
Pt K this afternoon 1430 was 3.2. Notified to [place], [place]. Awaiting further orders.  Will continue to monitor.   Lillia Pauls, PA ordered 71mEq ok K.

## 2014-11-23 NOTE — Progress Notes (Signed)
ANTICOAGULATION CONSULT NOTE - Initial Consult  Pharmacy Consult for Coumadin Indication: atrial fibrillation  No Known Allergies  Patient Measurements: Height: 5\' 9"  (175.3 cm) Weight: 242 lb (109.77 kg) IBW/kg (Calculated) : 70.7  Vital Signs: Temp: 98.7 F (37.1 C) (09/18 1909) Temp Source: Oral (09/18 1909) BP: 111/69 mmHg (09/19 0223) Pulse Rate: 54 (09/19 0223)  Labs:  Recent Labs  11/22/14 2001 11/22/14 2010 11/22/14 2306  HGB  --  13.4  --   HCT  --  39.5  --   PLT  --  167  --   LABPROT 24.4*  --   --   INR 2.21*  --   --   CREATININE  --  2.23*  --   TROPONINI  --   --  0.03    Estimated Creatinine Clearance: 34.9 mL/min (by C-G formula based on Cr of 2.23).   Medical History: Past Medical History  Diagnosis Date  . A-fib     resolved 2006  . MI (myocardial infarction) 04/1986    preserved OV systolic function with apical akinesis   . Hypercholesterolemia   . Obesity   . Diverticulosis   . GERD (gastroesophageal reflux disease)   . CAD (coronary artery disease)   . DM type 2 (diabetes mellitus, type 2)     Medications:  Norvasc  Vit D  HCTZ  Cozaar  Lopressor  Metformin  Ntg  Zocor  Ultram Coumadin 5 mg daily except 7.5 mg WedSun  Assessment: 74 y.o. male admitted with chest pain, h/o Aifb, to continue Coumadin  Goal of Therapy:  INR 2-3 Monitor platelets by anticoagulation protocol: Yes   Plan:  Coumadin 5 mg today Daily INR  Abbott, Bronson Curb 11/23/2014,5:32 AM

## 2014-11-23 NOTE — ED Notes (Signed)
POCT CBG resulted 115; Burna Mortimer, RN aware

## 2014-11-24 ENCOUNTER — Encounter (HOSPITAL_COMMUNITY): Payer: Self-pay | Admitting: General Practice

## 2014-11-24 DIAGNOSIS — R0789 Other chest pain: Secondary | ICD-10-CM | POA: Diagnosis not present

## 2014-11-24 DIAGNOSIS — I5032 Chronic diastolic (congestive) heart failure: Secondary | ICD-10-CM | POA: Diagnosis not present

## 2014-11-24 DIAGNOSIS — I2510 Atherosclerotic heart disease of native coronary artery without angina pectoris: Secondary | ICD-10-CM | POA: Diagnosis not present

## 2014-11-24 DIAGNOSIS — E871 Hypo-osmolality and hyponatremia: Secondary | ICD-10-CM | POA: Diagnosis not present

## 2014-11-24 DIAGNOSIS — N179 Acute kidney failure, unspecified: Secondary | ICD-10-CM | POA: Diagnosis not present

## 2014-11-24 DIAGNOSIS — E119 Type 2 diabetes mellitus without complications: Secondary | ICD-10-CM | POA: Diagnosis not present

## 2014-11-24 DIAGNOSIS — I4891 Unspecified atrial fibrillation: Secondary | ICD-10-CM | POA: Diagnosis not present

## 2014-11-24 DIAGNOSIS — I482 Chronic atrial fibrillation: Secondary | ICD-10-CM | POA: Diagnosis not present

## 2014-11-24 DIAGNOSIS — N390 Urinary tract infection, site not specified: Secondary | ICD-10-CM | POA: Diagnosis not present

## 2014-11-24 DIAGNOSIS — Z7901 Long term (current) use of anticoagulants: Secondary | ICD-10-CM | POA: Diagnosis not present

## 2014-11-24 DIAGNOSIS — E780 Pure hypercholesterolemia: Secondary | ICD-10-CM | POA: Diagnosis not present

## 2014-11-24 DIAGNOSIS — E860 Dehydration: Secondary | ICD-10-CM | POA: Diagnosis not present

## 2014-11-24 LAB — GLUCOSE, CAPILLARY
GLUCOSE-CAPILLARY: 211 mg/dL — AB (ref 65–99)
Glucose-Capillary: 141 mg/dL — ABNORMAL HIGH (ref 65–99)

## 2014-11-24 LAB — BASIC METABOLIC PANEL
ANION GAP: 7 (ref 5–15)
BUN: 28 mg/dL — ABNORMAL HIGH (ref 6–20)
CALCIUM: 8.3 mg/dL — AB (ref 8.9–10.3)
CO2: 25 mmol/L (ref 22–32)
CREATININE: 1.54 mg/dL — AB (ref 0.61–1.24)
Chloride: 102 mmol/L (ref 101–111)
GFR, EST AFRICAN AMERICAN: 49 mL/min — AB (ref >60)
GFR, EST NON AFRICAN AMERICAN: 42 mL/min — AB (ref >60)
GLUCOSE: 156 mg/dL — AB (ref 65–99)
Potassium: 4.6 mmol/L (ref 3.5–5.1)
Sodium: 134 mmol/L — ABNORMAL LOW (ref 135–145)

## 2014-11-24 LAB — PROTIME-INR
INR: 2.8 — AB (ref 0.00–1.49)
PROTHROMBIN TIME: 29.1 s — AB (ref 11.6–15.2)

## 2014-11-24 LAB — HEMOGLOBIN A1C
Hgb A1c MFr Bld: 7.4 % — ABNORMAL HIGH (ref 4.8–5.6)
Mean Plasma Glucose: 166 mg/dL

## 2014-11-24 MED ORDER — ATORVASTATIN CALCIUM 20 MG PO TABS: 20.0000 mg | ORAL_TABLET | Freq: Every day | ORAL | Status: DC

## 2014-11-24 MED ORDER — CEFUROXIME AXETIL 500 MG PO TABS: 500.0000 mg | ORAL_TABLET | Freq: Two times a day (BID) | ORAL | Status: DC

## 2014-11-24 MED ORDER — METOPROLOL TARTRATE 37.5 MG PO TABS: 37.5000 mg | ORAL_TABLET | Freq: Two times a day (BID) | ORAL | Status: DC

## 2014-11-24 MED ORDER — INFLUENZA VAC SPLIT QUAD 0.5 ML IM SUSY: 0.5000 mL | PREFILLED_SYRINGE | INTRAMUSCULAR | Status: DC

## 2014-11-24 MED ORDER — WARFARIN SODIUM 5 MG PO TABS: 5.0000 mg | ORAL_TABLET | Freq: Once | ORAL | Status: DC

## 2014-11-24 MED ORDER — INFLUENZA VAC SPLIT QUAD 0.5 ML IM SUSY
0.5000 mL | PREFILLED_SYRINGE | INTRAMUSCULAR | Status: AC
  Administered 2014-11-24: 0.5 mL via INTRAMUSCULAR
  Filled 2014-11-24: qty 0.5

## 2014-11-24 NOTE — Progress Notes (Signed)
ANTICOAGULATION CONSULT NOTE - Initial Consult  Pharmacy Consult for Coumadin Indication: atrial fibrillation  No Known Allergies  Patient Measurements: Height: 5\' 9"  (175.3 cm) Weight: 242 lb (109.77 kg) IBW/kg (Calculated) : 70.7  Vital Signs: Temp: 97.7 F (36.5 C) (09/20 0500) Temp Source: Oral (09/20 0500) BP: 129/55 mmHg (09/20 0500) Pulse Rate: 53 (09/20 0745)  Labs:  Recent Labs  11/22/14 2001  11/22/14 2010 11/22/14 2306 11/23/14 0545 11/23/14 1028 11/23/14 1630 11/24/14 0350  HGB  --   --  13.4  --  12.8*  --   --   --   HCT  --   --  39.5  --  36.9*  --   --   --   PLT  --   --  167  --  149*  --   --   --   LABPROT 24.4*  --   --   --  26.1*  --   --  29.1*  INR 2.21*  --   --   --  2.42*  --   --  2.80*  CREATININE  --   < > 2.23*  --  1.89*  --  1.76* 1.54*  TROPONINI  --   --   --  0.03 0.16* <0.03  --   --   < > = values in this interval not displayed.  Estimated Creatinine Clearance: 50.6 mL/min (by C-G formula based on Cr of 1.54).   Medical History: Past Medical History  Diagnosis Date  . A-fib     resolved 2006  . MI (myocardial infarction) 04/1986    preserved OV systolic function with apical akinesis   . Hypercholesterolemia   . Obesity   . Diverticulosis   . GERD (gastroesophageal reflux disease)   . CAD (coronary artery disease)   . DM type 2 (diabetes mellitus, type 2)     Medications:  Norvasc  Vit D  HCTZ  Cozaar  Lopressor  Metformin  Ntg  Zocor  Ultram Coumadin 5 mg daily except 7.5 mg WedSun  Assessment: 74 y.o. male admitted with chest pain, h/o Aifb, to continue Coumadin.INR today remains therapeutic at 2.8. Hg 12.8, Plt 149K. Eating 100% of diet   Goal of Therapy:  INR 2-3 Monitor platelets by anticoagulation protocol: Yes   Plan:  -Coumadin home coumadin dose of 5 mg daily except 7.5 mg on Wed/Sun -Daily INR  Albertina Parr, PharmD., BCPS Clinical Pharmacist Pager [phone]

## 2014-11-24 NOTE — Discharge Summary (Signed)
Discharge Summary  WOODLEY PETZOLD TKW:[id number] DOB: 11/15/1940  PCP: Henrine Screws, MD  Admit date: 11/22/2014 Discharge date: 11/24/2014  Time spent: 25 minutes  Recommendations for Outpatient Follow-up:  1. Medication change: Metoprolol decreased from 50 mg by mouth twice a day to 37.5 by mouth twice a day 2. Medication change: Patient previously on Zocor which has been found to have interaction with his Norvasc. Started on atorvastatin. 3. New medication: Ceftin 500 mg by mouth twice a day 3 days  4. Patient will follow-up with his PCP, Dr. Inda Merlin in the next few weeks (at that time can be evaluated for bradycardia and see if changing beta blocker dose is appropriate 5. Patient follow-up with his cardiologist, Dr. Tamala Julian in the next month  Discharge Diagnoses:  Active Hospital Problems   Diagnosis Date Noted  . Chest pain 11/22/2014  . Hyponatremia 11/23/2014  . AKI (acute kidney injury) 11/23/2014  . UTI (lower urinary tract infection) 11/23/2014  . Chronic diastolic heart failure 92/42/6834  . CAD (coronary artery disease)   . GERD (gastroesophageal reflux disease)   . DM type 2 (diabetes mellitus, type 2)   . Hypercholesterolemia   . A-fib     Resolved Hospital Problems   Diagnosis Date Noted Date Resolved  No resolved problems to display.    Discharge Condition: Improved, being discharged home  Diet recommendation: Heart healthy  Filed Weights   11/22/14 1909 11/22/14 2218 11/23/14 1119  Weight: 110.678 kg (244 lb) 109.77 kg (242 lb) 109.77 kg (242 lb)    History of present illness:  74 year old male with past medical history of age or fibrillation and CAD and diabetes admitted on 9/18 for chest tightness and dizziness and emergency room found to have acute kidney injury in the setting of an acute UTI.  Hospital Course:  Principal Problem:   Chest pain: Patient seen by cardiology who felt chest pain tightness or noncardiac in nature and troponin which  was minimally elevated at 0.6 could be attributed to the renal failure. They recommended treating urinary tract infection and kidney injury and outpatient follow-up.  Bilateral that afternoon, patient chest pain-free Active Problems:   A-fib: Chads 2 Vasc score of 5, on Coumadin, therapeutic   Hypercholesterolemia   GERD (gastroesophageal reflux disease)   CAD (coronary artery disease)   DM type 2 (diabetes mellitus, type 2): Blood sugar stable this hospitalization, stable 02 100.    Hyponatremia: Sodium on admission was 129. Secondary dehydration. By day of discharge, up to 134    AKI (acute kidney injury): Patient with baseline creatinine around 1.2-1.3, but with normal GFR. He initially presented with a creatinine of 2.23 and GFR of 31. With hydration by the early morning hours of 9/20, patient's creatinine down to 1.54 and GFR 50.  He continued receiving IV fluids for another 6-8 hours and should be back to baseline renal function.    UTI (lower urinary tract infection): Cultures not yet back by time of dictation. Had completed 2 days of IV Rocephin. Being discharged on by mouth Ceftin 3 more days. Avoided Cipro given patient on Coumadin.    Chronic diastolic heart failure noted on echocardiogram. Patient euvolemic. Already on beta blocker and ARB:    Procedures:  Echocardiogram done 1/96: Grade 1 diastolic dysfunction   Consultations:  Cardiology   Discharge Exam: BP 129/55 mmHg  Pulse 53  Temp(Src) 97.7 F (36.5 C) (Oral)  Resp 18  Ht 5\' 9"  (1.753 m)  Wt 109.77 kg (242 lb)  BMI 35.72 kg/m2  SpO2 96%  General: Alert and oriented 3, no acute distress Cardiovascular: Irregular rhythm, rate controlled Respiratory:  Clear to auscultation bilaterally  Discharge Instructions You were cared for by a hospitalist during your hospital stay. If you have any questions about your discharge medications or the care you received while you were in the hospital after you are discharged,  you can call the unit and asked to speak with the hospitalist on call if the hospitalist that took care of you is not available. Once you are discharged, your primary care physician will handle any further medical issues. Please note that NO REFILLS for any discharge medications will be authorized once you are discharged, as it is imperative that you return to your primary care physician (or establish a relationship with a primary care physician if you do not have one) for your aftercare needs so that they can reassess your need for medications and monitor your lab values.  Discharge Instructions    Diet - low sodium heart healthy    Complete by:  As directed      Diet - low sodium heart healthy    Complete by:  As directed      Increase activity slowly    Complete by:  As directed      Increase activity slowly    Complete by:  As directed             Medication List    STOP taking these medications        simvastatin 40 MG tablet  Commonly known as:  ZOCOR      TAKE these medications        amLODipine 5 MG tablet  Commonly known as:  NORVASC  Take 1 tablet (5 mg total) by mouth daily.     atorvastatin 20 MG tablet  Commonly known as:  LIPITOR  Take 1 tablet (20 mg total) by mouth daily at 6 PM.     cefUROXime 500 MG tablet  Commonly known as:  CEFTIN  Take 1 tablet (500 mg total) by mouth 2 (two) times daily with a meal.     hydrochlorothiazide 25 MG tablet  Commonly known as:  HYDRODIURIL  Take 12.5 mg by mouth 2 (two) times daily.     losartan 100 MG tablet  Commonly known as:  COZAAR  Take 100 mg by mouth daily.     metFORMIN 500 MG tablet  Commonly known as:  GLUCOPHAGE  Take 500 mg by mouth 2 (two) times daily with a meal.     Metoprolol Tartrate 37.5 MG Tabs  Take 37.5 mg by mouth 2 (two) times daily.     nitroGLYCERIN 0.4 MG SL tablet  Commonly known as:  NITROSTAT  Place 0.4 mg under the tongue every 5 (five) minutes as needed.     traMADol 50 MG  tablet  Commonly known as:  ULTRAM  Take 50 mg by mouth See admin instructions. Patient takes 50mg  twice daily, then as needed can take addt'l tablet     Vitamin D3 1000 UNITS Caps  Take 1 capsule (1,000 Units total) by mouth 2 (two) times daily.     warfarin 5 MG tablet  Commonly known as:  COUMADIN  Take 5 mg by mouth daily at 6 PM. Takes 7.5mg  on sun and wed  Takes 5mg  all other days       No Known Allergies     Follow-up Information    Follow up with  Henrine Screws, MD.   Specialty:  Internal Medicine   Why:  please call for appt if not contacted, left detailed message with office, need follow up appt in 2 weeks   Contact information:   [address] [place] [address] [phone]       Follow up with Sinclair Grooms, MD On 12/10/2014.   Specialty:  Cardiology   Why:  See PA Nell Range @ 1215pm   Contact information:   [address] [place] [address] [phone]        The results of significant diagnostics from this hospitalization (including imaging, microbiology, ancillary and laboratory) are listed below for reference.    Significant Diagnostic Studies: Dg Chest 2 View  11/22/2014   CLINICAL DATA:  Mid sternal chest pain onset this morning lasting 30 min, diaphoresis, coronary artery disease post MI and 4 vessel CABG, type II diabetes mellitus, atrial fibrillation  EXAM: CHEST  2 VIEW  COMPARISON:  11/23/2010  FINDINGS: Borderline enlargement of cardiac silhouette post CABG.  Mediastinal contours and pulmonary vascularity normal.  Lungs clear.  No pulmonary infiltrate, pleural effusion or pneumothorax.  Osseous structures unremarkable.  IMPRESSION: Post CABG.  No acute abnormalities.   Electronically Signed   By: Lavonia Dana M.D.   On: 11/22/2014 20:24    Microbiology: No results found for this or any previous visit (from the past 240 hour(s)).   Labs: Basic Metabolic Panel:  Recent Labs Lab 11/22/14 2010  11/23/14 0545 11/23/14 1630 11/24/14 0350  NA 129* 135 135 134*  K 4.1 3.6 3.2* 4.6  CL 94* 99* 100* 102  CO2 20* 26 25 25   GLUCOSE 157* 117* 154* 156*  BUN 22* 28* 28* 28*  CREATININE 2.23* 1.89* 1.76* 1.54*  CALCIUM 8.6* 8.7* 8.8* 8.3*   Liver Function Tests:  Recent Labs Lab 11/22/14 2001 11/23/14 0545  AST 20 18  ALT 20 18  ALKPHOS 55 53  BILITOT 1.5* 0.5  PROT 7.3 6.5  ALBUMIN 3.9 3.6    Recent Labs Lab 11/22/14 2001  LIPASE 22   No results for input(s): AMMONIA in the last 168 hours. CBC:  Recent Labs Lab 11/22/14 2010 11/23/14 0545  WBC 11.7* 8.8  NEUTROABS  --  6.4  HGB 13.4 12.8*  HCT 39.5 36.9*  MCV 88.2 88.3  PLT 167 149*   Cardiac Enzymes:  Recent Labs Lab 11/22/14 2306 11/23/14 0545 11/23/14 1028  TROPONINI 0.03 0.16* <0.03   BNP: BNP (last 3 results)  Recent Labs  11/22/14 2001  BNP 96.5    ProBNP (last 3 results) No results for input(s): PROBNP in the last 8760 hours.  CBG:  Recent Labs Lab 11/23/14 0847 11/23/14 1119 11/23/14 1656 11/23/14 2127 11/24/14 0604  GLUCAP 115* 149* 183* 195* 141*       Signed:  KRISHNAN,SENDIL K  [facility] 11/24/2014, 10:32 AM

## 2014-12-02 ENCOUNTER — Other Ambulatory Visit (HOSPITAL_COMMUNITY): Payer: Commercial Managed Care - HMO

## 2014-12-08 DIAGNOSIS — M169 Osteoarthritis of hip, unspecified: Secondary | ICD-10-CM | POA: Diagnosis not present

## 2014-12-08 DIAGNOSIS — N390 Urinary tract infection, site not specified: Secondary | ICD-10-CM | POA: Diagnosis not present

## 2014-12-09 NOTE — Progress Notes (Signed)
Cardiology Office Note    Date:  12/09/2014   ID:  Albert Short, DOB 1940-12-13, MRN [id number]  PCP:  Henrine Screws, MD  Cardiologist:  Dr. Tamala Julian  History of Present Illness: Albert Short is a 74 y.o. male CAD s/p CABG 4 in 2012, hypertension, diabetes mellitus, PAF on coumadin, sinus brady and obesity who presents to clinic to day for post hospital follow up for UTI and AKI.   Albert Short has had one week of progressive fatigue and weakness which started around the time he saw his Dr. Tamala Julian in the office on 11/18/14 . Subsequently has been found to have acute renal failure likely due to dehydration in the setting of urinary tract infection. He was started on ceftriaxone with marked improvement.  His troponin was mildly elevated at 0.16 which was felt to be due to demand ischemia given his known coronary artery bypass grafting. Cardiology was consulted because of this and because the patient complained of chest pain when he called EMS at presentation. He had no further chest pain after this one episode. He was seen by Dr. Debara [place] in the hospital who recommended outpatient follow up with possible stress testing.    During his last visit with Dr. Tamala Julian he ordered an outpatient ECHO for assess for LV dysfunction; this was done while admitted to [facility] and revealed mild LVH, EF 45-50%, akinesis of the mid-apicalanteroseptal andapical myocardium. G1DD. At that visit Dr. Tamala Julian also increased amlodipine to 5mg  due to elevated BPs.  Today he presents for follow up. He has had no chest pain and doesn't remember ever having chest pain prior to admission. After a long discussion he thinks he remembers having a little chest pain when he called the ambulance. He moreso remembers burning with urination. He has had no problems since discharge except for feeling a little tired and worn out. However, today and yesterday he was feeling almost back to normal in terms of energy. No SOB, orthopnea, PND or  LE edema.  Studies:  - LHC (10/21/10): Severe left main disease including diffuse disease in the LAD,first diagonal, and distal circumflex. Decreased LV function with apical severe hypokinesis and EF of 40-45%. Diffuse distal aortic atherosclerosis. No evidence of aneurysm. He was referred for CABG.  - Echo (11/23/14):  Mild LVH, EF 45-50%, akinesis of the mid-apicalanteroseptal andapical myocardium. G1DD.    Recent Labs/Images:   Recent Labs  11/22/14 2001  11/23/14 0545  11/24/14 0350  NA  --   < > 135  < > 134*  K  --   < > 3.6  < > 4.6  BUN  --   < > 28*  < > 28*  CREATININE  --   < > 1.89*  < > 1.54*  ALT 20  --  18  --   --   HGB  --   < > 12.8*  --   --   BNP 96.5  --   --   --   --   < > = values in this interval not displayed.   Dg Chest 2 View  11/22/2014   CLINICAL DATA:  Mid sternal chest pain onset this morning lasting 30 min, diaphoresis, coronary artery disease post MI and 4 vessel CABG, type II diabetes mellitus, atrial fibrillation  EXAM: CHEST  2 VIEW  COMPARISON:  11/23/2010  FINDINGS: Borderline enlargement of cardiac silhouette post CABG.  Mediastinal contours and pulmonary vascularity normal.  Lungs clear.  No pulmonary infiltrate,  pleural effusion or pneumothorax.  Osseous structures unremarkable.  IMPRESSION: Post CABG.  No acute abnormalities.   Electronically Signed   By: Lavonia Dana M.D.   On: 11/22/2014 20:24     Wt Readings from Last 3 Encounters:  11/23/14 242 lb (109.77 kg)  11/18/14 244 lb 1.9 oz (110.732 kg)  11/04/13 244 lb (110.678 kg)     Past Medical History  Diagnosis Date  . A-fib     resolved 2006  . MI (myocardial infarction) 04/1986    preserved OV systolic function with apical akinesis   . Hypercholesterolemia   . Obesity   . Diverticulosis   . GERD (gastroesophageal reflux disease)   . CAD (coronary artery disease)   . DM type 2 (diabetes mellitus, type 2)     Current Outpatient Prescriptions  Medication Sig Dispense Refill    . amLODipine (NORVASC) 5 MG tablet Take 1 tablet (5 mg total) by mouth daily. 90 tablet 3  . atorvastatin (LIPITOR) 20 MG tablet Take 1 tablet (20 mg total) by mouth daily at 6 PM. 60 tablet 1  . cefUROXime (CEFTIN) 500 MG tablet Take 1 tablet (500 mg total) by mouth 2 (two) times daily with a meal. 6 tablet 0  . Cholecalciferol (VITAMIN D3) 1000 UNITS CAPS Take 1 capsule (1,000 Units total) by mouth 2 (two) times daily.    . hydrochlorothiazide 25 MG tablet Take 12.5 mg by mouth 2 (two) times daily.     Marland Kitchen losartan (COZAAR) 100 MG tablet Take 100 mg by mouth daily.     . metFORMIN (GLUCOPHAGE) 500 MG tablet Take 500 mg by mouth 2 (two) times daily with a meal.      . metoprolol 37.5 MG TABS Take 37.5 mg by mouth 2 (two) times daily. 60 tablet 0  . nitroGLYCERIN (NITROSTAT) 0.4 MG SL tablet Place 0.4 mg under the tongue every 5 (five) minutes as needed.      . traMADol (ULTRAM) 50 MG tablet Take 50 mg by mouth See admin instructions. Patient takes 50mg  twice daily, then as needed can take addt'l tablet    . warfarin (COUMADIN) 5 MG tablet Take 5 mg by mouth daily at 6 PM. Takes 7.5mg  on sun and wed  Takes 5mg  all other days     No current facility-administered medications for this visit.     Allergies:   Review of patient's allergies indicates no known allergies.   Social History:  The patient  reports that he quit smoking about 28 years ago. His smoking use included Cigarettes. He has never used smokeless tobacco. He reports that he does not drink alcohol or use illicit drugs.   Family History:  The patient's family history includes Heart disease in his brother.   ROS:  Please see the history of present illness.  All other systems reviewed and negative.    PHYSICAL EXAM: VS:  There were no vitals taken for this visit. Well nourished, well developed, in no acute distress HEENT: normal Neck: no JVD Cardiac:  normal S1, S2; RRR; no murmur Lungs:  clear to auscultation bilaterally, no  wheezing, rhonchi or rales Abd: soft, nontender, no hepatomegaly Ext: no edema Skin: warm and dry Neuro:  CNs 2-12 intact, no focal abnormalities noted  EKG:  Not done today   ASSESSMENT AND PLAN:  Albert Short is a 74 y.o. male CAD s/p CABG 4 in 2012, hypertension, diabetes mellitus, PAF on coumadin, sinus brady and obesity who presents to clinic to  day for post hospital follow up for UTI and AKI.   Coronary artery disease s/p CABG:  -- He did have one episode of CP and 2D ECHO with Mild LVH, EF 45-50%, akinesis of the mid-apicalanteroseptal andapical myocardium. G1DD.  -- Will proceed with lexiscan myoview ( he cannot walk due to hip issues) -- Continue BB and statin. No ASA due to coumadin use  Paroxysmal atrial fibrillation: -- No recent symptomatic recurrences. Cont coumadin and lopressor   HLD: -- Followed by primary care. Continue statin  HTN: Better control today: 152/68. Continue current regimen with amlodipine 5mg , lopressor 37.5mg  BID, losartan 100mg  qd  Systolic dysfunction:  -- 2D ECHO with Mild LVH, EF 45-50%, akinesis of the mid-apicalanteroseptal andapical myocardium. -- Continue ARB and BB. Will get stress test to r/o ischemic CM ( of note EF was the same during heart cath in 2012) -- Appears euvolemic   Disposition:  FU depending in stress test results. Otherwise 1 year with Dr. Tamala Julian (patient doesn't want to come back any earlier if stress test okay).   Signed, Vesta Mixer, PA-C, MHS 12/09/2014 10:27 PM    [facility] [place], [place], [place]  [address] Phone: [phone]; Fax: [phone]

## 2014-12-10 ENCOUNTER — Ambulatory Visit (INDEPENDENT_AMBULATORY_CARE_PROVIDER_SITE_OTHER): Payer: Commercial Managed Care - HMO | Admitting: Physician Assistant

## 2014-12-10 ENCOUNTER — Encounter: Payer: Self-pay | Admitting: Physician Assistant

## 2014-12-10 VITALS — BP 152/68 | HR 49 | Ht 69.0 in | Wt 243.4 lb

## 2014-12-10 DIAGNOSIS — R079 Chest pain, unspecified: Secondary | ICD-10-CM | POA: Diagnosis not present

## 2014-12-10 NOTE — Patient Instructions (Signed)
Medication Instructions:   Your physician recommends that you continue on your current medications as directed. Please refer to the Current Medication list given to you today.    Labwork: NONE ORDER TODAY    Testing/Procedures: Your physician has requested that you have a lexiscan myoview. For further information please visit [email or web address]. Please follow instruction sheet, as given.   Follow-Up:  BASED UPON RESULTS OF STRESS TEST SOME ONE WILL CONTACT YOU   Any Other Special Instructions Will Be Listed Below (If Applicable).

## 2014-12-16 ENCOUNTER — Telehealth (HOSPITAL_COMMUNITY): Payer: Self-pay | Admitting: *Deleted

## 2014-12-16 NOTE — Telephone Encounter (Signed)
Patient given detailed instructions per Myocardial Perfusion Study Information Sheet for test on 12/18/14 at 745. Patient notified to arrive 15 minutes early and that it is imperative to arrive on time for appointment to keep from having the test rescheduled.  If you need to cancel or reschedule your appointment, please call the office within 24 hours of your appointment. Failure to do so may result in a cancellation of your appointment, and a $50 no show fee. Patient verbalized understanding. Hubbard Robinson, RN

## 2014-12-18 ENCOUNTER — Ambulatory Visit (HOSPITAL_COMMUNITY): Payer: Commercial Managed Care - HMO | Attending: Internal Medicine

## 2014-12-18 DIAGNOSIS — R9439 Abnormal result of other cardiovascular function study: Secondary | ICD-10-CM | POA: Insufficient documentation

## 2014-12-18 DIAGNOSIS — E119 Type 2 diabetes mellitus without complications: Secondary | ICD-10-CM | POA: Insufficient documentation

## 2014-12-18 DIAGNOSIS — R079 Chest pain, unspecified: Secondary | ICD-10-CM

## 2014-12-18 DIAGNOSIS — I10 Essential (primary) hypertension: Secondary | ICD-10-CM | POA: Insufficient documentation

## 2014-12-18 LAB — MYOCARDIAL PERFUSION IMAGING
CHL CUP NUCLEAR SDS: 0
CHL CUP RESTING HR STRESS: 45 {beats}/min
LV sys vol: 88 mL
LVDIAVOL: 169 mL
Peak HR: 57 {beats}/min
RATE: 0.37
SRS: 16
SSS: 16
TID: 0.94

## 2014-12-18 MED ORDER — REGADENOSON 0.4 MG/5ML IV SOLN
0.4000 mg | Freq: Once | INTRAVENOUS | Status: AC
  Administered 2014-12-18: 0.4 mg via INTRAVENOUS

## 2014-12-18 MED ORDER — TECHNETIUM TC 99M SESTAMIBI GENERIC - CARDIOLITE
10.5000 | Freq: Once | INTRAVENOUS | Status: AC | PRN
  Administered 2014-12-18: 11 via INTRAVENOUS

## 2014-12-18 MED ORDER — TECHNETIUM TC 99M SESTAMIBI GENERIC - CARDIOLITE
31.8000 | Freq: Once | INTRAVENOUS | Status: AC | PRN
  Administered 2014-12-18: 31.8 via INTRAVENOUS

## 2014-12-21 ENCOUNTER — Telehealth: Payer: Self-pay | Admitting: *Deleted

## 2014-12-21 NOTE — Telephone Encounter (Signed)
Per Nell Range, PA-C, called and made pt aware that his stress test was - for any blockages and that his EF% was still at 48% which is not anything new, and right now there will not be any changes made to his plan of care.  Pt verbalized appreciation for the good news.

## 2014-12-21 NOTE — Telephone Encounter (Signed)
-----   Message from Eileen Stanford, PA-C sent at 12/18/2014  5:01 PM EDT ----- Please let him know stress test negative for blockages. EF 48 % which is not new. No changes in plans!

## 2014-12-29 DIAGNOSIS — I4891 Unspecified atrial fibrillation: Secondary | ICD-10-CM | POA: Diagnosis not present

## 2015-01-26 DIAGNOSIS — I4891 Unspecified atrial fibrillation: Secondary | ICD-10-CM | POA: Diagnosis not present

## 2015-03-03 DIAGNOSIS — I4891 Unspecified atrial fibrillation: Secondary | ICD-10-CM | POA: Diagnosis not present

## 2015-03-30 DIAGNOSIS — I4891 Unspecified atrial fibrillation: Secondary | ICD-10-CM | POA: Diagnosis not present

## 2015-03-30 DIAGNOSIS — I739 Peripheral vascular disease, unspecified: Secondary | ICD-10-CM | POA: Diagnosis not present

## 2015-03-30 DIAGNOSIS — M169 Osteoarthritis of hip, unspecified: Secondary | ICD-10-CM | POA: Diagnosis not present

## 2015-03-30 DIAGNOSIS — Z23 Encounter for immunization: Secondary | ICD-10-CM | POA: Diagnosis not present

## 2015-03-30 DIAGNOSIS — Z0001 Encounter for general adult medical examination with abnormal findings: Secondary | ICD-10-CM | POA: Diagnosis not present

## 2015-03-30 DIAGNOSIS — E1140 Type 2 diabetes mellitus with diabetic neuropathy, unspecified: Secondary | ICD-10-CM | POA: Diagnosis not present

## 2015-03-30 DIAGNOSIS — E782 Mixed hyperlipidemia: Secondary | ICD-10-CM | POA: Diagnosis not present

## 2015-03-30 DIAGNOSIS — Z1389 Encounter for screening for other disorder: Secondary | ICD-10-CM | POA: Diagnosis not present

## 2015-03-30 DIAGNOSIS — Z7901 Long term (current) use of anticoagulants: Secondary | ICD-10-CM | POA: Diagnosis not present

## 2015-03-30 DIAGNOSIS — I2510 Atherosclerotic heart disease of native coronary artery without angina pectoris: Secondary | ICD-10-CM | POA: Diagnosis not present

## 2015-03-30 DIAGNOSIS — I10 Essential (primary) hypertension: Secondary | ICD-10-CM | POA: Diagnosis not present

## 2015-09-08 ENCOUNTER — Other Ambulatory Visit: Payer: Self-pay | Admitting: Interventional Cardiology

## 2015-09-09 ENCOUNTER — Other Ambulatory Visit: Payer: Self-pay | Admitting: Interventional Cardiology

## 2015-10-19 DIAGNOSIS — I739 Peripheral vascular disease, unspecified: Secondary | ICD-10-CM | POA: Diagnosis not present

## 2015-10-19 DIAGNOSIS — I10 Essential (primary) hypertension: Secondary | ICD-10-CM | POA: Diagnosis not present

## 2015-10-19 DIAGNOSIS — N529 Male erectile dysfunction, unspecified: Secondary | ICD-10-CM | POA: Diagnosis not present

## 2015-10-19 DIAGNOSIS — E1140 Type 2 diabetes mellitus with diabetic neuropathy, unspecified: Secondary | ICD-10-CM | POA: Diagnosis not present

## 2015-10-19 DIAGNOSIS — E782 Mixed hyperlipidemia: Secondary | ICD-10-CM | POA: Diagnosis not present

## 2015-10-19 DIAGNOSIS — Z7901 Long term (current) use of anticoagulants: Secondary | ICD-10-CM | POA: Diagnosis not present

## 2015-10-19 DIAGNOSIS — I4891 Unspecified atrial fibrillation: Secondary | ICD-10-CM | POA: Diagnosis not present

## 2015-10-19 DIAGNOSIS — I2510 Atherosclerotic heart disease of native coronary artery without angina pectoris: Secondary | ICD-10-CM | POA: Diagnosis not present

## 2015-10-19 DIAGNOSIS — M169 Osteoarthritis of hip, unspecified: Secondary | ICD-10-CM | POA: Diagnosis not present

## 2015-10-26 DIAGNOSIS — E119 Type 2 diabetes mellitus without complications: Secondary | ICD-10-CM | POA: Diagnosis not present

## 2015-11-01 DIAGNOSIS — H1851 Endothelial corneal dystrophy: Secondary | ICD-10-CM | POA: Diagnosis not present

## 2015-11-01 DIAGNOSIS — H2513 Age-related nuclear cataract, bilateral: Secondary | ICD-10-CM | POA: Diagnosis not present

## 2015-11-01 DIAGNOSIS — H40013 Open angle with borderline findings, low risk, bilateral: Secondary | ICD-10-CM | POA: Diagnosis not present

## 2015-11-09 DIAGNOSIS — H2512 Age-related nuclear cataract, left eye: Secondary | ICD-10-CM | POA: Diagnosis not present

## 2015-11-11 DIAGNOSIS — H2512 Age-related nuclear cataract, left eye: Secondary | ICD-10-CM | POA: Diagnosis not present

## 2015-11-25 DIAGNOSIS — H2511 Age-related nuclear cataract, right eye: Secondary | ICD-10-CM | POA: Diagnosis not present

## 2015-12-02 ENCOUNTER — Other Ambulatory Visit: Payer: Self-pay | Admitting: Interventional Cardiology

## 2015-12-03 DIAGNOSIS — Z23 Encounter for immunization: Secondary | ICD-10-CM | POA: Diagnosis not present

## 2015-12-03 DIAGNOSIS — R5383 Other fatigue: Secondary | ICD-10-CM | POA: Diagnosis not present

## 2015-12-09 ENCOUNTER — Encounter (INDEPENDENT_AMBULATORY_CARE_PROVIDER_SITE_OTHER): Payer: Self-pay

## 2015-12-09 ENCOUNTER — Encounter: Payer: Self-pay | Admitting: Interventional Cardiology

## 2015-12-09 ENCOUNTER — Ambulatory Visit (INDEPENDENT_AMBULATORY_CARE_PROVIDER_SITE_OTHER): Payer: Commercial Managed Care - HMO | Admitting: Interventional Cardiology

## 2015-12-09 VITALS — BP 144/78 | HR 44 | Ht 70.0 in | Wt 250.8 lb

## 2015-12-09 DIAGNOSIS — I480 Paroxysmal atrial fibrillation: Secondary | ICD-10-CM | POA: Diagnosis not present

## 2015-12-09 DIAGNOSIS — I5032 Chronic diastolic (congestive) heart failure: Secondary | ICD-10-CM

## 2015-12-09 DIAGNOSIS — R001 Bradycardia, unspecified: Secondary | ICD-10-CM

## 2015-12-09 DIAGNOSIS — I2510 Atherosclerotic heart disease of native coronary artery without angina pectoris: Secondary | ICD-10-CM | POA: Diagnosis not present

## 2015-12-09 DIAGNOSIS — E7800 Pure hypercholesterolemia, unspecified: Secondary | ICD-10-CM

## 2015-12-09 DIAGNOSIS — E1100 Type 2 diabetes mellitus with hyperosmolarity without nonketotic hyperglycemic-hyperosmolar coma (NKHHC): Secondary | ICD-10-CM

## 2015-12-09 MED ORDER — METOPROLOL TARTRATE 25 MG PO TABS: 25.0000 mg | ORAL_TABLET | Freq: Two times a day (BID) | ORAL | 3 refills | Status: DC

## 2015-12-09 NOTE — Patient Instructions (Signed)
Medication Instructions:  1) DECREASE Metoprolol to 25mg  twice daily  Labwork: None  Testing/Procedures: None  Follow-Up: Your physician recommends that you schedule a follow-up appointment in: 4-6 weeks with Dr. Tamala Julian or a PA or NP with an EKG.  This is a follow up due to the decrease in Metoprolol.  Your physician wants you to follow-up in: 1 year with Dr. Tamala Julian. You will receive a reminder letter in the mail two months in advance. If you don't receive a letter, please call our office to schedule the follow-up appointment.    Any Other Special Instructions Will Be Listed Below (If Applicable).     If you need a refill on your cardiac medications before your next appointment, please call your pharmacy.

## 2015-12-09 NOTE — Progress Notes (Signed)
Cardiology Office Note    Date:  12/09/2015   ID:  Albert Short, [place] 30-Jan-1941, MRN [id number]  PCP:  Henrine Screws, MD  Cardiologist: Sinclair Grooms, MD   Chief Complaint  Patient presents with  . Coronary Artery Disease    History of Present Illness:  Albert Short is a 75 y.o. male  With CAD s/p CABG 4 in 2012, hypertension, diabetes mellitus, PAF on coumadin, sinus brady and obesity and A/CKD for followup.  He is doing well. He complains of significant fatigue and weakness several days ago. He relates this to eye drops that he was taking. He has not had angina. He denies dyspnea. No excessive palpitations or tachycardia. According to him, today is a good day.   Past Medical History:  Diagnosis Date  . A-fib ([place])    resolved 2006  . CAD (coronary artery disease)   . Diverticulosis   . DM type 2 (diabetes mellitus, type 2) ([place])   . GERD (gastroesophageal reflux disease)   . Hypercholesterolemia   . MI (myocardial infarction) 04/1986   preserved OV systolic function with apical akinesis   . Obesity     Past Surgical History:  Procedure Laterality Date  . CARDIAC CATHETERIZATION  Apr 14, 1939   Dr Daneen Schick  . CORONARY ARTERY BYPASS GRAFT    . Coronary artery bypass grafting x4  10/27/10   [place] Gum    Current Medications: Outpatient Medications Prior to Visit  Medication Sig Dispense Refill  . amLODipine (NORVASC) 5 MG tablet Take 1 tablet (5 mg total) by mouth daily. 90 tablet 0  . atorvastatin (LIPITOR) 20 MG tablet Take 1 tablet (20 mg total) by mouth daily at 6 PM. 60 tablet 1  . Cholecalciferol (VITAMIN D3) 1000 UNITS CAPS Take 1 capsule (1,000 Units total) by mouth 2 (two) times daily.    . hydrochlorothiazide 25 MG tablet Take 12.5 mg by mouth 2 (two) times daily.     Marland Kitchen losartan (COZAAR) 100 MG tablet Take 100 mg by mouth daily.     . metFORMIN (GLUCOPHAGE) 500 MG tablet Take 500 mg by mouth 2 (two) times daily with a meal.      .  nitroGLYCERIN (NITROSTAT) 0.4 MG SL tablet Place 0.4 mg under the tongue every 5 (five) minutes as needed for chest pain (no more than 3).     . simvastatin (ZOCOR) 40 MG tablet Take 1 tablet by mouth daily.    . traMADol (ULTRAM) 50 MG tablet Take 50 mg by mouth as directed. Patient takes 50mg  twice daily, then as needed can take addt'l tablet    . warfarin (COUMADIN) 5 MG tablet Take 5 mg by mouth daily at 6 PM. Takes 7.5mg  on sun and wed  Takes 5mg  all other days    . metoprolol (LOPRESSOR) 100 MG tablet Take 50 mg by mouth 2 (two) times daily.     No facility-administered medications prior to visit.      Allergies:   Review of patient's allergies indicates no known allergies.   Social History   Social History  . Marital status: Married    Spouse name: N/A  . Number of children: 2  . Years of education: N/A   Occupational History  . truck driver, retired    Social History Main Topics  . Smoking status: Former Smoker    Types: Cigarettes    Quit date: 04/06/1986  . Smokeless tobacco: Never Used  . Alcohol use No  Comment: d/c'ed with MI in 1988  . Drug use: No  . Sexual activity: Not Asked   Other Topics Concern  . None   Social History Narrative  . None     Family History:  The patient's family history includes Alcoholism in his father; Diabetes in his mother; Heart disease in his brother and paternal grandfather.   ROS:   Please see the history of present illness.    Difficulty with vision, constipation, balance difficulty, and increased appetite.  All other systems reviewed and are negative.   PHYSICAL EXAM:   VS:  BP (!) 144/78   Pulse (!) 44   Ht 5\' 10"  (1.778 m)   Wt 250 lb 12.8 oz (113.8 kg)   BMI 35.99 kg/m    GEN: Well nourished, well developed, in no acute distress  HEENT: normal  Neck: no JVD, carotid bruits, or masses Cardiac: Bradycardia with RR; no murmurs, rubs, or gallops,no edema  Respiratory:  clear to auscultation bilaterally, normal  work of breathing GI: soft, nontender, nondistended, + BS MS: no deformity or atrophy  Skin: warm and dry, no rash Neuro:  Alert and Oriented x 3, Strength and sensation are intact Psych: euthymic mood, full affect  Wt Readings from Last 3 Encounters:  12/09/15 250 lb 12.8 oz (113.8 kg)  12/18/14 243 lb (110.2 kg)  12/10/14 243 lb 6.4 oz (110.4 kg)      Studies/Labs Reviewed:   EKG:  EKG  Marked sinus bradycardia 44 bpm with evidence of anteroseptal infarction. No change when compared to prior tracings  Recent Labs: No results found for requested labs within last 8760 hours.   Lipid Panel No results found for: CHOL, TRIG, HDL, CHOLHDL, VLDL, LDLCALC, LDLDIRECT  Additional studies/ records that were reviewed today include:  No new data    ASSESSMENT:    1. Coronary artery disease involving native coronary artery of native heart without angina pectoris   2. Paroxysmal atrial fibrillation (HCC)   3. Chronic diastolic heart failure ([place])   4. Hypercholesterolemia   5. Bradycardia   6. Type 2 diabetes mellitus with hyperosmolarity without coma, without long-term current use of insulin (HCC)      PLAN:  In order of problems listed above:  1. He is asymptomatic. Continue risk modification of diabetes, hypertension, and lipids. Call if angina.. 2. No clinical recurrences although he is known to have asymptomatic episodes of atrial fibrillation without decompensation. 3. No evidence of volume overload. Call if dyspnea. 4. Continue moderate intensity statin therapy with simvastatin 40 mg daily. Followed by primary care.  5. Marked sinus bradycardia will be addressed by decreasing metoprolol tartrate to 25 mg twice a day. He'll return in 4-6 weeks for reassessment of heart rate and blood pressure on the lower beta blocker regimen. I will otherwise see him back in one year for annual follow-up.    Medication Adjustments/Labs and Tests Ordered: Current medicines are reviewed at  length with the patient today.  Concerns regarding medicines are outlined above.  Medication changes, Labs and Tests ordered today are listed in the Patient Instructions below. There are no Patient Instructions on file for this visit.   Signed, Sinclair Grooms, MD  12/09/2015 8:46 AM    [facility] [place], [place], [place]  [address] Phone: [phone]; Fax: [phone]

## 2015-12-23 ENCOUNTER — Encounter: Payer: Self-pay | Admitting: Cardiology

## 2016-01-05 NOTE — Progress Notes (Signed)
Cardiology Office Note   Date:  01/06/2016   ID:  JAMALE PETRAK, DOB 11-Oct-1940, MRN [id number]  PCP:  Henrine Screws, MD  Cardiologist:  Dr. Tamala Julian     Chief Complaint  Patient presents with  . Bradycardia    pt c/o no chest pain   . Edema    both ankles but more in the left ankle       History of Present Illness: Albert Short is a 75 y.o. male who presents for recent bradycardia with decrease of BB.  Here today for BP and HR eval.    He has a hx. Of CAD s/p CABG 4 in 2012, hypertension, diabetes mellitus, PAF on coumadin, sinus brady and obesity   Last year his troponin was mildly elevated at 0.16 which was felt to be due to demand ischemia given his known coronary artery bypass grafting. Cardiology was consulted because of this and because the patient complained of chest pain when he called EMS at presentation. He had no further chest pain after this one episode.He had outpt nuc study that was neg.   Studies:  - LHC (10/21/10): Severe left main disease including diffuse disease in the LAD,first diagonal, and distal circumflex. Decreased LV function with apical severe hypokinesis and EF of 40-45%. Diffuse distal aortic atherosclerosis. No evidence of aneurysm. He was referred for CABG.  - Echo (11/23/14):  Mild LVH, EF 45-50%, akinesis of the mid-apicalanteroseptal andapical myocardium. G1DD.  Currently complains of darker urine and foul smell.  No burning. He has no dizziness or lightheadedness.  No awareness of slower HR on last visit. No irregular HR.  No chest pain and no SOB.     Past Medical History:  Diagnosis Date  . A-fib ([place])    resolved 2006  . CAD (coronary artery disease)   . Diverticulosis   . DM type 2 (diabetes mellitus, type 2) ([place])   . GERD (gastroesophageal reflux disease)   . Hypercholesterolemia   . MI (myocardial infarction) 04/1986   preserved OV systolic function with apical akinesis   . Obesity     Past Surgical History:    Procedure Laterality Date  . CARDIAC CATHETERIZATION  06/12/39   Dr Daneen Schick  . CORONARY ARTERY BYPASS GRAFT    . Coronary artery bypass grafting x4  10/27/10   [place] Gum     Current Outpatient Prescriptions  Medication Sig Dispense Refill  . amLODipine (NORVASC) 5 MG tablet Take 1 tablet (5 mg total) by mouth daily. 90 tablet 0  . atorvastatin (LIPITOR) 20 MG tablet Take 1 tablet (20 mg total) by mouth daily at 6 PM. 60 tablet 1  . Cholecalciferol (VITAMIN D3) 1000 UNITS CAPS Take 1 capsule (1,000 Units total) by mouth 2 (two) times daily.    . hydrochlorothiazide 25 MG tablet Take 12.5 mg by mouth 2 (two) times daily.     Marland Kitchen losartan (COZAAR) 100 MG tablet Take 100 mg by mouth daily.     . metFORMIN (GLUCOPHAGE) 500 MG tablet Take 500 mg by mouth 2 (two) times daily with a meal.      . metoprolol tartrate (LOPRESSOR) 25 MG tablet Take 1 tablet (25 mg total) by mouth 2 (two) times daily. 180 tablet 3  . nitroGLYCERIN (NITROSTAT) 0.4 MG SL tablet Place 0.4 mg under the tongue every 5 (five) minutes as needed for chest pain (no more than 3).     . simvastatin (ZOCOR) 40 MG tablet Take 1 tablet by  mouth daily.    . traMADol (ULTRAM) 50 MG tablet Take 50 mg by mouth as directed. Patient takes 50mg  twice daily, then as needed can take addt'l tablet    . warfarin (COUMADIN) 5 MG tablet Take 5 mg by mouth daily at 6 PM. Takes 7.5mg  on sun and wed  Takes 5mg  all other days     No current facility-administered medications for this visit.     Allergies:   Review of patient's allergies indicates no known allergies.    Social History:  The patient  reports that he quit smoking about 29 years ago. His smoking use included Cigarettes. He has never used smokeless tobacco. He reports that he does not drink alcohol or use drugs.   Family History:  The patient's family history includes Alcoholism in his father; Diabetes in his mother; Heart disease in his brother and paternal grandfather.     ROS:  General:no colds or fevers, no weight changes Skin:no rashes or ulcers HEENT:no blurred vision, no congestion CV:see HPI PUL:see HPI GI:no diarrhea constipation or melena, no indigestion GU:? hematuria, no dysuria see above in HPI MS:no joint pain, no claudication Neuro:no syncope, no lightheadedness Endo:+ diabetes, no thyroid disease  Wt Readings from Last 3 Encounters:  01/06/16 250 lb 12.8 oz (113.8 kg)  12/09/15 250 lb 12.8 oz (113.8 kg)  12/18/14 243 lb (110.2 kg)     PHYSICAL EXAM: VS:  BP (!) 148/62   Pulse (!) 59   Ht 5\' 9"  (1.753 m)   Wt 250 lb 12.8 oz (113.8 kg)   BMI 37.04 kg/m  , BMI Body mass index is 37.04 kg/m. General:Pleasant affect, NAD Skin:Warm and dry, brisk capillary refill Neck:supple, no JVD, no bruits  Heart:S1S2 RRR without murmur, gallup, rub or click Lungs:clear without rales, rhonchi, or wheezes [id number], non tender, + BS, do not palpate liver spleen or masses Neuro:alert and oriented X 3, MAE, follows commands, + facial symmetry    EKG:  EKG is ordered today. The ekg ordered today demonstrates Sinus brady at 59 to 61 with occ PVC.  No acute changes otherwise.    Recent Labs: No results found for requested labs within last 8760 hours.    Lipid Panel No results found for: CHOL, TRIG, HDL, CHOLHDL, VLDL, LDLCALC, LDLDIRECT     Other studies Reviewed: Additional studies/ records that were reviewed today include: last visit note..   ASSESSMENT AND PLAN:  Bradycardia- HR improved with lower dose of BB, BP is the same.  He will follow up with Dr. Tamala Julian in 1 year unless lightheadedness or syncope.  Dr. Inda Merlin to follow BP as well.   Possible hematuria and foul smell- check u/a I will treat if UTI and he will follow with Dr. Inda Merlin.  Coronary artery disease s/p CABG:  -- Continue BB and statin. No ASA due to coumadin use, no chest pain  Paroxysmal atrial fibrillation: -- No recent symptomatic recurrences. Cont coumadin and  lopressor at the lower dose  HLD: -- Followed by primary care. Continue statin  HTN: Stable control today: . Continue current regimen with amlodipine 5mg , lopressor 25mg  BID, losartan 100mg  qd  Systolic dysfunction:  -- 2D ECHO with Mild LVH, EF 45-50%, akinesis of the mid-apicalanteroseptal andapical myocardium. -- Continue ARB and BB.  CM ( of note EF was the same during heart cath in 2012) -- Appears euvolemic - nuc 2016 EF 48%, large scar in all of the apical segments and inf. Wall no ischemia.  Current medicines are reviewed with the patient today.  The patient Has no concerns regarding medicines.  The following changes have been made:  See above Labs/ tests ordered today include:see above  Disposition:   FU:  see above  Signed, Cecilie Kicks, NP  01/06/2016 9:16 AM    [facility] [address] [place] [place], [place] Phone: [phone]; Fax: [phone]

## 2016-01-06 ENCOUNTER — Ambulatory Visit (INDEPENDENT_AMBULATORY_CARE_PROVIDER_SITE_OTHER): Payer: Commercial Managed Care - HMO | Admitting: Cardiology

## 2016-01-06 ENCOUNTER — Encounter: Payer: Self-pay | Admitting: Cardiology

## 2016-01-06 VITALS — BP 148/62 | HR 59 | Ht 69.0 in | Wt 250.8 lb

## 2016-01-06 DIAGNOSIS — R001 Bradycardia, unspecified: Secondary | ICD-10-CM

## 2016-01-06 DIAGNOSIS — I5032 Chronic diastolic (congestive) heart failure: Secondary | ICD-10-CM

## 2016-01-06 DIAGNOSIS — I2510 Atherosclerotic heart disease of native coronary artery without angina pectoris: Secondary | ICD-10-CM | POA: Diagnosis not present

## 2016-01-06 DIAGNOSIS — I480 Paroxysmal atrial fibrillation: Secondary | ICD-10-CM

## 2016-01-06 DIAGNOSIS — E7800 Pure hypercholesterolemia, unspecified: Secondary | ICD-10-CM | POA: Diagnosis not present

## 2016-01-06 DIAGNOSIS — R3989 Other symptoms and signs involving the genitourinary system: Secondary | ICD-10-CM

## 2016-01-06 DIAGNOSIS — I10 Essential (primary) hypertension: Secondary | ICD-10-CM

## 2016-01-06 LAB — URINALYSIS
BILIRUBIN URINE: NEGATIVE
Hgb urine dipstick: NEGATIVE
KETONES UR: NEGATIVE
Leukocytes, UA: NEGATIVE
Nitrite: NEGATIVE
PROTEIN: NEGATIVE
SPECIFIC GRAVITY, URINE: 1.014 (ref 1.001–1.035)
pH: 7.5 (ref 5.0–8.0)

## 2016-01-06 NOTE — Patient Instructions (Addendum)
Medication Instructions:  Your physician recommends that you continue on your current medications as directed. Please refer to the Current Medication list given to you today.   Labwork: TODAY:  URINALYSIS   Testing/Procedures: None ordered  Follow-Up: Your physician wants you to follow-up in: [place] will receive a reminder letter in the mail two months in advance. If you don't receive a letter, please call our office to schedule the follow-up appointment.  Any Other Special Instructions Will Be Listed Below (If Applicable).  If you have any light headiness, dizziness, or irregular heart beat, call our office.   If you need a refill on your cardiac medications before your next appointment, please call your pharmacy.

## 2016-02-04 ENCOUNTER — Other Ambulatory Visit: Payer: Self-pay | Admitting: Interventional Cardiology

## 2016-04-24 DIAGNOSIS — E782 Mixed hyperlipidemia: Secondary | ICD-10-CM | POA: Diagnosis not present

## 2016-04-24 DIAGNOSIS — E1140 Type 2 diabetes mellitus with diabetic neuropathy, unspecified: Secondary | ICD-10-CM | POA: Diagnosis not present

## 2016-04-24 DIAGNOSIS — Z7901 Long term (current) use of anticoagulants: Secondary | ICD-10-CM | POA: Diagnosis not present

## 2016-04-24 DIAGNOSIS — Z1389 Encounter for screening for other disorder: Secondary | ICD-10-CM | POA: Diagnosis not present

## 2016-04-24 DIAGNOSIS — M1610 Unilateral primary osteoarthritis, unspecified hip: Secondary | ICD-10-CM | POA: Diagnosis not present

## 2016-04-24 DIAGNOSIS — I739 Peripheral vascular disease, unspecified: Secondary | ICD-10-CM | POA: Diagnosis not present

## 2016-04-24 DIAGNOSIS — Z7984 Long term (current) use of oral hypoglycemic drugs: Secondary | ICD-10-CM | POA: Diagnosis not present

## 2016-04-24 DIAGNOSIS — Z0000 Encounter for general adult medical examination without abnormal findings: Secondary | ICD-10-CM | POA: Diagnosis not present

## 2016-04-24 DIAGNOSIS — I4891 Unspecified atrial fibrillation: Secondary | ICD-10-CM | POA: Diagnosis not present

## 2016-04-24 DIAGNOSIS — N529 Male erectile dysfunction, unspecified: Secondary | ICD-10-CM | POA: Diagnosis not present

## 2016-04-24 DIAGNOSIS — M4800 Spinal stenosis, site unspecified: Secondary | ICD-10-CM | POA: Diagnosis not present

## 2016-04-24 DIAGNOSIS — M25551 Pain in right hip: Secondary | ICD-10-CM | POA: Diagnosis not present

## 2016-04-24 DIAGNOSIS — I2510 Atherosclerotic heart disease of native coronary artery without angina pectoris: Secondary | ICD-10-CM | POA: Diagnosis not present

## 2016-04-24 DIAGNOSIS — I10 Essential (primary) hypertension: Secondary | ICD-10-CM | POA: Diagnosis not present

## 2016-04-24 DIAGNOSIS — Z79899 Other long term (current) drug therapy: Secondary | ICD-10-CM | POA: Diagnosis not present

## 2016-08-29 DIAGNOSIS — Z7901 Long term (current) use of anticoagulants: Secondary | ICD-10-CM | POA: Diagnosis not present

## 2016-10-11 DIAGNOSIS — I4891 Unspecified atrial fibrillation: Secondary | ICD-10-CM | POA: Diagnosis not present

## 2016-10-24 DIAGNOSIS — I10 Essential (primary) hypertension: Secondary | ICD-10-CM | POA: Diagnosis not present

## 2016-10-24 DIAGNOSIS — I739 Peripheral vascular disease, unspecified: Secondary | ICD-10-CM | POA: Diagnosis not present

## 2016-10-24 DIAGNOSIS — I2510 Atherosclerotic heart disease of native coronary artery without angina pectoris: Secondary | ICD-10-CM | POA: Diagnosis not present

## 2016-10-24 DIAGNOSIS — Z79899 Other long term (current) drug therapy: Secondary | ICD-10-CM | POA: Diagnosis not present

## 2016-10-24 DIAGNOSIS — N529 Male erectile dysfunction, unspecified: Secondary | ICD-10-CM | POA: Diagnosis not present

## 2016-10-24 DIAGNOSIS — E1140 Type 2 diabetes mellitus with diabetic neuropathy, unspecified: Secondary | ICD-10-CM | POA: Diagnosis not present

## 2016-10-24 DIAGNOSIS — I4891 Unspecified atrial fibrillation: Secondary | ICD-10-CM | POA: Diagnosis not present

## 2016-10-24 DIAGNOSIS — E782 Mixed hyperlipidemia: Secondary | ICD-10-CM | POA: Diagnosis not present

## 2016-11-27 DIAGNOSIS — Z23 Encounter for immunization: Secondary | ICD-10-CM | POA: Diagnosis not present

## 2016-11-27 DIAGNOSIS — I4891 Unspecified atrial fibrillation: Secondary | ICD-10-CM | POA: Diagnosis not present

## 2016-11-27 DIAGNOSIS — F321 Major depressive disorder, single episode, moderate: Secondary | ICD-10-CM | POA: Diagnosis not present

## 2016-11-27 DIAGNOSIS — F5101 Primary insomnia: Secondary | ICD-10-CM | POA: Diagnosis not present

## 2016-11-29 ENCOUNTER — Encounter: Payer: Self-pay | Admitting: Interventional Cardiology

## 2016-12-08 ENCOUNTER — Encounter: Payer: Self-pay | Admitting: Interventional Cardiology

## 2016-12-08 ENCOUNTER — Ambulatory Visit (INDEPENDENT_AMBULATORY_CARE_PROVIDER_SITE_OTHER): Payer: Medicare HMO | Admitting: Interventional Cardiology

## 2016-12-08 VITALS — BP 136/76 | HR 49 | Ht 70.5 in | Wt 253.8 lb

## 2016-12-08 DIAGNOSIS — I5032 Chronic diastolic (congestive) heart failure: Secondary | ICD-10-CM | POA: Diagnosis not present

## 2016-12-08 DIAGNOSIS — N5201 Erectile dysfunction due to arterial insufficiency: Secondary | ICD-10-CM

## 2016-12-08 DIAGNOSIS — E1100 Type 2 diabetes mellitus with hyperosmolarity without nonketotic hyperglycemic-hyperosmolar coma (NKHHC): Secondary | ICD-10-CM

## 2016-12-08 DIAGNOSIS — I2510 Atherosclerotic heart disease of native coronary artery without angina pectoris: Secondary | ICD-10-CM

## 2016-12-08 DIAGNOSIS — I480 Paroxysmal atrial fibrillation: Secondary | ICD-10-CM | POA: Diagnosis not present

## 2016-12-08 DIAGNOSIS — E7800 Pure hypercholesterolemia, unspecified: Secondary | ICD-10-CM

## 2016-12-08 NOTE — Patient Instructions (Signed)
Medication Instructions:  Your physician recommends that you continue on your current medications as directed. Please refer to the Current Medication list given to you today.   Labwork: None  Testing/Procedures: None  Follow-Up: Your physician wants you to follow-up in: 1 year with Dr. Tamala Julian.  You will receive a reminder letter in the mail two months in advance. If you don't receive a letter, please call our office to schedule the follow-up appointment.   Any Other Special Instructions Will Be Listed Below (If Applicable).  Decrease the number of calories you are taking in daily and increase your physical activity.    If you need a refill on your cardiac medications before your next appointment, please call your pharmacy.

## 2016-12-08 NOTE — Progress Notes (Signed)
Cardiology Office Note    Date:  12/08/2016   ID:  Albert Short 1940/10/21, MRN [id number]  PCP:  Josetta Huddle, MD  Cardiologist: Sinclair Grooms, MD   Chief Complaint  Patient presents with  . Coronary Artery Disease  . Atrial Fibrillation  . Follow-up    Bradycardia    History of Present Illness:  Albert Short is a 76 y.o. male with CAD s/p CABG 4 in 2012, hypertension, diabetes mellitus, PAF on coumadin, sinus brady and obesity and A/CKD for followup.  He denies angina and syncope. He has not felt palpitations. He denies angina. He is active but hot weather really zaps his energy. He denies orthopnea, PND, and orthopnea. He has not had headaches. No blood in the urine or stool. He is compliant with his medications. There are no cost issues with reference to medication compliance.   Past Medical History:  Diagnosis Date  . A-fib ([place])    resolved 2006  . CAD (coronary artery disease)   . Diverticulosis   . DM type 2 (diabetes mellitus, type 2) ([place])   . GERD (gastroesophageal reflux disease)   . Hypercholesterolemia   . MI (myocardial infarction) ([place]) 04/1986   preserved OV systolic function with apical akinesis   . Obesity     Past Surgical History:  Procedure Laterality Date  . CARDIAC CATHETERIZATION  07-20-1939   Dr Daneen Schick  . CORONARY ARTERY BYPASS GRAFT    . Coronary artery bypass grafting x4  10/27/10   [facility] Gum    Current Medications: Outpatient Medications Prior to Visit  Medication Sig Dispense Refill  . amLODipine (NORVASC) 5 MG tablet TAKE 1 TABLET EVERY DAY 90 tablet 3  . atorvastatin (LIPITOR) 20 MG tablet Take 1 tablet (20 mg total) by mouth daily at 6 PM. 60 tablet 1  . Cholecalciferol (VITAMIN D3) 1000 UNITS CAPS Take 1 capsule (1,000 Units total) by mouth 2 (two) times daily.    . hydrochlorothiazide 25 MG tablet Take 12.5 mg by mouth 2 (two) times daily.     Marland Kitchen losartan (COZAAR) 100 MG tablet Take 100 mg by mouth daily.      . metFORMIN (GLUCOPHAGE) 500 MG tablet Take 500 mg by mouth 2 (two) times daily with a meal.      . nitroGLYCERIN (NITROSTAT) 0.4 MG SL tablet Place 0.4 mg under the tongue every 5 (five) minutes as needed for chest pain (no more than 3).     . simvastatin (ZOCOR) 40 MG tablet Take 1 tablet by mouth daily.    . traMADol (ULTRAM) 50 MG tablet Take 50 mg by mouth as directed. Patient takes 50mg  twice daily, then as needed can take addt'l tablet    . warfarin (COUMADIN) 5 MG tablet Take 5 mg by mouth daily at 6 PM. Takes 7.5mg  on sun and wed  Takes 5mg  all other days    . metoprolol tartrate (LOPRESSOR) 25 MG tablet Take 1 tablet (25 mg total) by mouth 2 (two) times daily. 180 tablet 3   No facility-administered medications prior to visit.      Allergies:   Patient has no known allergies.   Social History   Social History  . Marital status: Married    Spouse name: N/A  . Number of children: 2  . Years of education: N/A   Occupational History  . truck driver, retired    Social History Main Topics  . Smoking status: Former Smoker  Types: Cigarettes    Quit date: 04/06/1986  . Smokeless tobacco: Never Used  . Alcohol use No     Comment: d/c'ed with MI in 1988  . Drug use: No  . Sexual activity: Not Asked   Other Topics Concern  . None   Social History Narrative  . None     Family History:  The patient's family history includes Alcoholism in his father; Diabetes in his mother; Heart disease in his brother and paternal grandfather.   ROS:   Please see the history of present illness.    Decreased energy, decreased libido, sweating, not following a strict low carbohydrate diet, trace late afternoon lower extremity edema. Erectile dysfunction. All other systems reviewed and are negative.   PHYSICAL EXAM:   VS:  BP 136/76 (BP Location: Left Arm)   Pulse (!) 49   Ht 5' 10.5" (1.791 m)   Wt 253 lb 12.8 oz (115.1 kg)   BMI 35.90 kg/m    GEN: Well nourished, well developed,  in no acute distress . Moderate obesity. HEENT: normal  Neck: no JVD, carotid bruits, or masses Cardiac: RRR; no murmurs, rubs, or gallops,no edema  Respiratory:  clear to auscultation bilaterally, normal work of breathing GI: soft, nontender, nondistended, + BS MS: no deformity or atrophy  Skin: warm and dry, no rash Neuro:  Alert and Oriented x 3, Strength and sensation are intact Psych: euthymic mood, full affect  Wt Readings from Last 3 Encounters:  12/08/16 253 lb 12.8 oz (115.1 kg)  01/06/16 250 lb 12.8 oz (113.8 kg)  12/09/15 250 lb 12.8 oz (113.8 kg)      Studies/Labs Reviewed:   EKG:  EKG  Sinus bradycardia, 49 bpm, normal PR interval, Q waves V1 through V3 with left axis deviation. Anterolateral T-wave abnormality.  Recent Labs: No results found for requested labs within last 8760 hours.   Lipid Panel No results found for: CHOL, TRIG, HDL, CHOLHDL, VLDL, LDLCALC, LDLDIRECT  Additional studies/ records that were reviewed today include:  No new data    ASSESSMENT:    1. Chronic diastolic heart failure (HCC)   2. Paroxysmal atrial fibrillation ([place])   3. Coronary artery disease involving native coronary artery of native heart without angina pectoris   4. Hypercholesterolemia   5. Erectile dysfunction due to arterial insufficiency   6. Type 2 diabetes mellitus with hyperosmolarity without coma, without long-term current use of insulin (HCC)      PLAN:  In order of problems listed above:  1. No clinical evidence of volume overload. 2. Currently in sinus bradycardia and no symptoms to suggest prolonged recurrences of atrial fibrillation. He is on anticoagulation therapy to prevent embolic stroke. 3. Stable without angina pectoris. 4. LDL most recently was 84, August 2018. Continue current medical therapy. 5. Likely related to vascular disease. Rule out hormone related. Suggested that he discuss with Dr. Inda Merlin. PDE 5 inhibitor therapy could be safely used if  appropriate. 6. Most recent hemoglobin A1c was 7.2. Decreased caloric intake and increase activity is recommended.  Encouraged increased activity for weight control, decreased caloric intake of carbohydrates, and clinical follow-up in one year.  Medication Adjustments/Labs and Tests Ordered: Current medicines are reviewed at length with the patient today.  Concerns regarding medicines are outlined above.  Medication changes, Labs and Tests ordered today are listed in the Patient Instructions below. Patient Instructions  Medication Instructions:  Your physician recommends that you continue on your current medications as directed. Please refer to the  Current Medication list given to you today.   Labwork: None  Testing/Procedures: None  Follow-Up: Your physician wants you to follow-up in: 1 year with Dr. Tamala Julian.  You will receive a reminder letter in the mail two months in advance. If you don't receive a letter, please call our office to schedule the follow-up appointment.   Any Other Special Instructions Will Be Listed Below (If Applicable).  Decrease the number of calories you are taking in daily and increase your physical activity.    If you need a refill on your cardiac medications before your next appointment, please call your pharmacy.      Signed, Sinclair Grooms, MD  12/08/2016 11:27 AM    [address], [place], [place]  [address] Phone: [phone]; Fax: [phone]

## 2016-12-11 ENCOUNTER — Telehealth: Payer: Self-pay | Admitting: Interventional Cardiology

## 2016-12-11 ENCOUNTER — Encounter: Payer: Self-pay | Admitting: *Deleted

## 2016-12-11 MED ORDER — METOPROLOL TARTRATE 25 MG PO TABS: 25.0000 mg | ORAL_TABLET | Freq: Two times a day (BID) | ORAL | 3 refills | Status: DC

## 2016-12-11 NOTE — Telephone Encounter (Signed)
Unable to reach patient. Unable to lvm. Per Epic Metoprolol Tartrate is 25mg  twice daily and Losartan is 100mg  once daily.

## 2016-12-11 NOTE — Addendum Note (Signed)
Addended by: Marlis Edelson C on: 12/11/2016 11:16 AM   Modules accepted: Orders

## 2016-12-11 NOTE — Telephone Encounter (Signed)
New Message  Pt c/o medication issue:  1. Name of Medication: Metoprolol   2. How are you currently taking this medication (dosage and times per day)? Per pt was prescribed 100mg    3. Are you having a reaction (difficulty breathing--STAT)? No   4. What is your medication issue? Per pt states his MG is incorrect. He states he is suppose to take 25mg  2x daily. Please call back to discuss

## 2016-12-11 NOTE — Telephone Encounter (Signed)
Follow Up:      Returning your call from today. 

## 2016-12-11 NOTE — Telephone Encounter (Addendum)
Patient requesting for Dr. Inda Merlin to see new medication list.  He is take Metoprolol Tartrate 25mg  BID, Metformin 500mg  QD, and he is on Simvastatin not Atorvastatin.  [employer] tried to send him a refill for 100mg 's of Metoprolol, in which the patient refused because of new medication dosage. [employer] asked patient to call us and verify dosing and sent a new rx.  Patient stated Dr. Inda Merlin wanted to know what his revised list looks like if changes are made.  He also states he needs a refill for his Metformin.  I have messaged Dr. Inda Merlin of the above.    Albert Short and I verbally went over his entire medication list via phone call and made any changes as stated on the bottles per patient.

## 2017-01-09 DIAGNOSIS — I4891 Unspecified atrial fibrillation: Secondary | ICD-10-CM | POA: Diagnosis not present

## 2017-02-21 DIAGNOSIS — I4891 Unspecified atrial fibrillation: Secondary | ICD-10-CM | POA: Diagnosis not present

## 2017-03-29 DIAGNOSIS — I4891 Unspecified atrial fibrillation: Secondary | ICD-10-CM | POA: Diagnosis not present

## 2017-04-30 DIAGNOSIS — N401 Enlarged prostate with lower urinary tract symptoms: Secondary | ICD-10-CM | POA: Diagnosis not present

## 2017-04-30 DIAGNOSIS — Z7901 Long term (current) use of anticoagulants: Secondary | ICD-10-CM | POA: Diagnosis not present

## 2017-04-30 DIAGNOSIS — I10 Essential (primary) hypertension: Secondary | ICD-10-CM | POA: Diagnosis not present

## 2017-04-30 DIAGNOSIS — I4891 Unspecified atrial fibrillation: Secondary | ICD-10-CM | POA: Diagnosis not present

## 2017-04-30 DIAGNOSIS — E1140 Type 2 diabetes mellitus with diabetic neuropathy, unspecified: Secondary | ICD-10-CM | POA: Diagnosis not present

## 2017-04-30 DIAGNOSIS — Z79899 Other long term (current) drug therapy: Secondary | ICD-10-CM | POA: Diagnosis not present

## 2017-04-30 DIAGNOSIS — E782 Mixed hyperlipidemia: Secondary | ICD-10-CM | POA: Diagnosis not present

## 2017-04-30 DIAGNOSIS — I2510 Atherosclerotic heart disease of native coronary artery without angina pectoris: Secondary | ICD-10-CM | POA: Diagnosis not present

## 2017-04-30 DIAGNOSIS — B351 Tinea unguium: Secondary | ICD-10-CM | POA: Diagnosis not present

## 2017-04-30 DIAGNOSIS — Z0000 Encounter for general adult medical examination without abnormal findings: Secondary | ICD-10-CM | POA: Diagnosis not present

## 2017-04-30 DIAGNOSIS — R351 Nocturia: Secondary | ICD-10-CM | POA: Diagnosis not present

## 2017-04-30 DIAGNOSIS — Z1389 Encounter for screening for other disorder: Secondary | ICD-10-CM | POA: Diagnosis not present

## 2017-04-30 DIAGNOSIS — E119 Type 2 diabetes mellitus without complications: Secondary | ICD-10-CM | POA: Diagnosis not present

## 2017-04-30 DIAGNOSIS — E559 Vitamin D deficiency, unspecified: Secondary | ICD-10-CM | POA: Diagnosis not present

## 2017-04-30 DIAGNOSIS — Z7984 Long term (current) use of oral hypoglycemic drugs: Secondary | ICD-10-CM | POA: Diagnosis not present

## 2017-05-29 DIAGNOSIS — N401 Enlarged prostate with lower urinary tract symptoms: Secondary | ICD-10-CM | POA: Diagnosis not present

## 2017-05-29 DIAGNOSIS — I4891 Unspecified atrial fibrillation: Secondary | ICD-10-CM | POA: Diagnosis not present

## 2017-05-29 DIAGNOSIS — Z7901 Long term (current) use of anticoagulants: Secondary | ICD-10-CM | POA: Diagnosis not present

## 2017-05-29 DIAGNOSIS — R351 Nocturia: Secondary | ICD-10-CM | POA: Diagnosis not present

## 2017-07-05 DIAGNOSIS — I4891 Unspecified atrial fibrillation: Secondary | ICD-10-CM | POA: Diagnosis not present

## 2017-08-02 DIAGNOSIS — I4891 Unspecified atrial fibrillation: Secondary | ICD-10-CM | POA: Diagnosis not present

## 2017-09-03 DIAGNOSIS — Z7901 Long term (current) use of anticoagulants: Secondary | ICD-10-CM | POA: Diagnosis not present

## 2017-09-03 DIAGNOSIS — I4891 Unspecified atrial fibrillation: Secondary | ICD-10-CM | POA: Diagnosis not present

## 2017-09-16 ENCOUNTER — Other Ambulatory Visit: Payer: Self-pay | Admitting: Interventional Cardiology

## 2017-10-02 DIAGNOSIS — I4891 Unspecified atrial fibrillation: Secondary | ICD-10-CM | POA: Diagnosis not present

## 2017-11-01 DIAGNOSIS — Z7901 Long term (current) use of anticoagulants: Secondary | ICD-10-CM | POA: Diagnosis not present

## 2017-11-01 DIAGNOSIS — E1140 Type 2 diabetes mellitus with diabetic neuropathy, unspecified: Secondary | ICD-10-CM | POA: Diagnosis not present

## 2017-11-01 DIAGNOSIS — E782 Mixed hyperlipidemia: Secondary | ICD-10-CM | POA: Diagnosis not present

## 2017-11-01 DIAGNOSIS — I481 Persistent atrial fibrillation: Secondary | ICD-10-CM | POA: Diagnosis not present

## 2017-11-01 DIAGNOSIS — I2510 Atherosclerotic heart disease of native coronary artery without angina pectoris: Secondary | ICD-10-CM | POA: Diagnosis not present

## 2017-11-01 DIAGNOSIS — E1165 Type 2 diabetes mellitus with hyperglycemia: Secondary | ICD-10-CM | POA: Diagnosis not present

## 2017-11-01 DIAGNOSIS — E559 Vitamin D deficiency, unspecified: Secondary | ICD-10-CM | POA: Diagnosis not present

## 2017-11-01 DIAGNOSIS — T733XXA Exhaustion due to excessive exertion, initial encounter: Secondary | ICD-10-CM | POA: Diagnosis not present

## 2017-11-01 DIAGNOSIS — I10 Essential (primary) hypertension: Secondary | ICD-10-CM | POA: Diagnosis not present

## 2017-11-29 DIAGNOSIS — Z7901 Long term (current) use of anticoagulants: Secondary | ICD-10-CM | POA: Diagnosis not present

## 2017-12-25 NOTE — Progress Notes (Signed)
Cardiology Office Note:    Date:  12/26/2017   ID:  Albert, Short 1940-05-15, MRN [id number]  PCP:  Josetta Huddle, MD  Cardiologist:  Sinclair Grooms, MD   Referring MD: Josetta Huddle, MD   Chief Complaint  Patient presents with  . Atrial Fibrillation  . Congestive Heart Failure  . Coronary Artery Disease    History of Present Illness:    Albert Short is a 77 y.o. male with a hx of CAD s/p CABG 4 in 2012, hypertension, diabetes mellitus, PAF on coumadin, sinus brady and obesity and A/CKD for followup.   Overall Albert Short is doing well.  He denies episodes of syncope, tachycardia, angina, edema, and claudication.  He states that he has low enthusiasm for physical activity.  He believes he sleeps well.  He does work in his yard but tires relatively easily.  This is not associated with chest discomfort or any particular cardiac complaint.  He denies orthopnea and PND.   Past Medical History:  Diagnosis Date  . A-fib ([place])    resolved 2006  . CAD (coronary artery disease)   . Diverticulosis   . DM type 2 (diabetes mellitus, type 2) ([place])   . GERD (gastroesophageal reflux disease)   . Hypercholesterolemia   . MI (myocardial infarction) ([place]) 04/1986   preserved OV systolic function with apical akinesis   . Obesity     Past Surgical History:  Procedure Laterality Date  . CARDIAC CATHETERIZATION  1939-06-18   Dr Daneen Schick  . CORONARY ARTERY BYPASS GRAFT    . Coronary artery bypass grafting x4  10/27/10   [place] Gum    Current Medications: Current Meds  Medication Sig  . amLODipine (NORVASC) 5 MG tablet Take 5 mg by mouth daily.  . Cholecalciferol (VITAMIN D3) 1000 UNITS CAPS Take 1 capsule (1,000 Units total) by mouth 2 (two) times daily.  . finasteride (PROSCAR) 5 MG tablet Take 5 mg by mouth daily.  . hydrochlorothiazide 25 MG tablet Take 12.5 mg by mouth 2 (two) times daily.   Marland Kitchen losartan (COZAAR) 100 MG tablet Take 100 mg by mouth daily.   . metFORMIN  (GLUCOPHAGE) 500 MG tablet Take 500 mg by mouth daily with breakfast.   . metoprolol tartrate (LOPRESSOR) 25 MG tablet Take 1 tablet (25 mg total) by mouth 2 (two) times daily. Please make appt with Dr. Tamala Julian for October before anymore refills. 1st attempt  . nitroGLYCERIN (NITROSTAT) 0.4 MG SL tablet Place 0.4 mg under the tongue every 5 (five) minutes as needed for chest pain (no more than 3).   . simvastatin (ZOCOR) 40 MG tablet Take 1 tablet by mouth daily.  . traMADol (ULTRAM) 50 MG tablet Take 50 mg by mouth as directed. Patient takes 50mg  twice daily, then as needed can take addt'l tablet  . warfarin (COUMADIN) 5 MG tablet Take 5 mg by mouth daily at 6 PM. Takes 7.5mg  on sun and wed  Takes 5mg  all other days     Allergies:   Patient has no known allergies.   Social History   Socioeconomic History  . Marital status: Married    Spouse name: Not on file  . Number of children: 2  . Years of education: Not on file  . Highest education level: Not on file  Occupational History  . Occupation: truck Geophysicist/field seismologist, retired  Scientific laboratory technician  . Financial resource strain: Not on file  . Food insecurity:    Worry: Not on file  Inability: Not on file  . Transportation needs:    Medical: Not on file    Non-medical: Not on file  Tobacco Use  . Smoking status: Former Smoker    Types: Cigarettes    Last attempt to quit: 04/06/1986    Years since quitting: 31.7  . Smokeless tobacco: Never Used  Substance and Sexual Activity  . Alcohol use: No    Comment: d/c'ed with MI in 1988  . Drug use: No  . Sexual activity: Not on file  Lifestyle  . Physical activity:    Days per week: Not on file    Minutes per session: Not on file  . Stress: Not on file  Relationships  . Social connections:    Talks on phone: Not on file    Gets together: Not on file    Attends religious service: Not on file    Active member of club or organization: Not on file    Attends meetings of clubs or organizations: Not on  file    Relationship status: Not on file  Other Topics Concern  . Not on file  Social History Narrative  . Not on file     Family History: The patient's family history includes Alcoholism in his father; Diabetes in his mother; Heart disease in his brother and paternal grandfather.  ROS:   Please see the history of present illness.    Snores at night although no recent complaints because he and his wife sleeping separate bedrooms.  Appetite is been stable.  No blood in the urine or stool.  No falls, syncope, or head trauma.  All other systems reviewed and are negative.  EKGs/Labs/Other Studies Reviewed:    The following studies were reviewed today: Reviewed the most recent imaging/functional studies from 2016.  LVEF low normal to mildly depressed in the 40 to 50% range.  Evidence of previous anterior infarct.  EKG:  EKG is  ordered today.  The ekg ordered today demonstrates sinus bradycardia, 52 bpm, QS pattern V1 through V4.  T wave abnormality in lead I, aVL, and precordial leads V1 through V3.  There is no change when compared to the tracing from October 2018.  Recent Labs: No results found for requested labs within last 8760 hours.  Recent Lipid Panel No results found for: CHOL, TRIG, HDL, CHOLHDL, VLDL, LDLCALC, LDLDIRECT  Physical Exam:    VS:  BP (!) 148/74   Pulse (!) 52   Ht 5\' 9"  (1.753 m)   Wt 244 lb 6.4 oz (110.9 kg)   BMI 36.09 kg/m     Wt Readings from Last 3 Encounters:  12/26/17 244 lb 6.4 oz (110.9 kg)  12/08/16 253 lb 12.8 oz (115.1 kg)  01/06/16 250 lb 12.8 oz (113.8 kg)     GEN: Brown scanned.  Well nourished, well developed in no acute distress HEENT: Normal NECK: No JVD. LYMPHATICS: No lymphadenopathy CARDIAC: Bradycardia with RRR, no murmur, no gallop, no edema. VASCULAR: 2+ bilateral radial and carotid pulses.  No bruits. RESPIRATORY:  Clear to auscultation without rales, wheezing or rhonchi  ABDOMEN: Soft, non-tender, non-distended, No  pulsatile mass, MUSCULOSKELETAL: No deformity  SKIN: Warm and dry NEUROLOGIC:  Alert and oriented x 3 PSYCHIATRIC:  Normal affect   ASSESSMENT:    1. Chronic diastolic heart failure (HCC)   2. Paroxysmal atrial fibrillation ([place])   3. Coronary artery disease involving native coronary artery of native heart without angina pectoris   4. Hypercholesterolemia   5. Erectile dysfunction  due to arterial insufficiency   6. Essential hypertension    PLAN:    In order of problems listed above:  1. No evidence of volume overload.  Continue current therapy. 2. In sinus bradycardia.  Has tachybradycardia syndrome.  No recent episodes of atrial fibrillation.  Maintaining sinus rhythm since revascularization. 3. He is stable without angina pectoris.  We discussed risk factor modification and targets in detail.  The significant outlier with reference to risk mitigation is aerobic activity.  We discussed attempting to achieve 150 minutes of moderate activity per week. 4. LDL target less than 70 5. Not discussed 6. Blood pressure target less than 130/80 mmHg.  Currently in excellent condition.  Concentrated on secondary risk prevention.  Educated concerning the importance of maintaining A1c less than 7, blood pressure 130/80 mmHg or less, LDL cholesterol less than 70, moderate aerobic activity equaling 150 minutes or more per week.  Greater than 50% of the time during this office visit was spent in education, counseling, and coordination of care related to underlying disease process and testing as outlined.    Medication Adjustments/Labs and Tests Ordered: Current medicines are reviewed at length with the patient today.  Concerns regarding medicines are outlined above.  Orders Placed This Encounter  Procedures  . EKG 12-Lead   No orders of the defined types were placed in this encounter.   Patient Instructions  Your physician recommends that you continue on your current medications as  directed. Please refer to the Current Medication list given to you today.   Your physician wants you to follow-up in: [place] will receive a reminder letter in the mail two months in advance. If you don't receive a letter, please call our office to schedule the follow-up appointment.   ENCOURAGED TO START A WALKING PROGRAM      Signed, Sinclair Grooms, MD  12/26/2017 12:30 PM    [place]

## 2017-12-26 ENCOUNTER — Ambulatory Visit: Payer: Medicare HMO | Admitting: Interventional Cardiology

## 2017-12-26 ENCOUNTER — Encounter: Payer: Self-pay | Admitting: Interventional Cardiology

## 2017-12-26 VITALS — BP 148/74 | HR 52 | Ht 69.0 in | Wt 244.4 lb

## 2017-12-26 DIAGNOSIS — N5201 Erectile dysfunction due to arterial insufficiency: Secondary | ICD-10-CM | POA: Diagnosis not present

## 2017-12-26 DIAGNOSIS — I5032 Chronic diastolic (congestive) heart failure: Secondary | ICD-10-CM

## 2017-12-26 DIAGNOSIS — I2510 Atherosclerotic heart disease of native coronary artery without angina pectoris: Secondary | ICD-10-CM

## 2017-12-26 DIAGNOSIS — E7800 Pure hypercholesterolemia, unspecified: Secondary | ICD-10-CM | POA: Diagnosis not present

## 2017-12-26 DIAGNOSIS — I480 Paroxysmal atrial fibrillation: Secondary | ICD-10-CM | POA: Diagnosis not present

## 2017-12-26 DIAGNOSIS — I10 Essential (primary) hypertension: Secondary | ICD-10-CM | POA: Diagnosis not present

## 2017-12-26 NOTE — Patient Instructions (Addendum)
Your physician recommends that you continue on your current medications as directed. Please refer to the Current Medication list given to you today.   Your physician wants you to follow-up in: Albert Short will receive a reminder letter in the mail two months in advance. If you don't receive a letter, please call our office to schedule the follow-up appointment.   ENCOURAGED TO START A WALKING PROGRAM

## 2018-01-02 DIAGNOSIS — Z7901 Long term (current) use of anticoagulants: Secondary | ICD-10-CM | POA: Diagnosis not present

## 2018-01-30 DIAGNOSIS — I2510 Atherosclerotic heart disease of native coronary artery without angina pectoris: Secondary | ICD-10-CM | POA: Diagnosis not present

## 2018-01-30 DIAGNOSIS — E119 Type 2 diabetes mellitus without complications: Secondary | ICD-10-CM | POA: Diagnosis not present

## 2018-01-30 DIAGNOSIS — I4819 Other persistent atrial fibrillation: Secondary | ICD-10-CM | POA: Diagnosis not present

## 2018-01-30 DIAGNOSIS — Z7901 Long term (current) use of anticoagulants: Secondary | ICD-10-CM | POA: Diagnosis not present

## 2018-02-26 DIAGNOSIS — Z7901 Long term (current) use of anticoagulants: Secondary | ICD-10-CM | POA: Diagnosis not present

## 2018-04-02 DIAGNOSIS — Z7901 Long term (current) use of anticoagulants: Secondary | ICD-10-CM | POA: Diagnosis not present

## 2018-05-03 DIAGNOSIS — I4891 Unspecified atrial fibrillation: Secondary | ICD-10-CM | POA: Diagnosis not present

## 2018-05-17 DIAGNOSIS — N401 Enlarged prostate with lower urinary tract symptoms: Secondary | ICD-10-CM | POA: Diagnosis not present

## 2018-05-17 DIAGNOSIS — I10 Essential (primary) hypertension: Secondary | ICD-10-CM | POA: Diagnosis not present

## 2018-05-17 DIAGNOSIS — F5101 Primary insomnia: Secondary | ICD-10-CM | POA: Diagnosis not present

## 2018-05-17 DIAGNOSIS — E1140 Type 2 diabetes mellitus with diabetic neuropathy, unspecified: Secondary | ICD-10-CM | POA: Diagnosis not present

## 2018-05-17 DIAGNOSIS — R351 Nocturia: Secondary | ICD-10-CM | POA: Diagnosis not present

## 2018-05-17 DIAGNOSIS — I2510 Atherosclerotic heart disease of native coronary artery without angina pectoris: Secondary | ICD-10-CM | POA: Diagnosis not present

## 2018-05-17 DIAGNOSIS — I739 Peripheral vascular disease, unspecified: Secondary | ICD-10-CM | POA: Diagnosis not present

## 2018-05-17 DIAGNOSIS — Z1389 Encounter for screening for other disorder: Secondary | ICD-10-CM | POA: Diagnosis not present

## 2018-05-17 DIAGNOSIS — I4891 Unspecified atrial fibrillation: Secondary | ICD-10-CM | POA: Diagnosis not present

## 2018-05-17 DIAGNOSIS — Z0000 Encounter for general adult medical examination without abnormal findings: Secondary | ICD-10-CM | POA: Diagnosis not present

## 2018-05-17 DIAGNOSIS — E1151 Type 2 diabetes mellitus with diabetic peripheral angiopathy without gangrene: Secondary | ICD-10-CM | POA: Diagnosis not present

## 2018-05-17 DIAGNOSIS — E559 Vitamin D deficiency, unspecified: Secondary | ICD-10-CM | POA: Diagnosis not present

## 2018-05-17 DIAGNOSIS — R001 Bradycardia, unspecified: Secondary | ICD-10-CM | POA: Diagnosis not present

## 2018-05-17 DIAGNOSIS — B351 Tinea unguium: Secondary | ICD-10-CM | POA: Diagnosis not present

## 2018-05-17 DIAGNOSIS — E782 Mixed hyperlipidemia: Secondary | ICD-10-CM | POA: Diagnosis not present

## 2018-06-06 DIAGNOSIS — I4819 Other persistent atrial fibrillation: Secondary | ICD-10-CM | POA: Diagnosis not present

## 2018-07-05 DIAGNOSIS — I4891 Unspecified atrial fibrillation: Secondary | ICD-10-CM | POA: Diagnosis not present

## 2018-11-20 DIAGNOSIS — M1610 Unilateral primary osteoarthritis, unspecified hip: Secondary | ICD-10-CM | POA: Diagnosis not present

## 2018-11-20 DIAGNOSIS — R001 Bradycardia, unspecified: Secondary | ICD-10-CM | POA: Diagnosis not present

## 2018-11-20 DIAGNOSIS — Z7901 Long term (current) use of anticoagulants: Secondary | ICD-10-CM | POA: Diagnosis not present

## 2018-11-20 DIAGNOSIS — D6869 Other thrombophilia: Secondary | ICD-10-CM | POA: Diagnosis not present

## 2018-11-20 DIAGNOSIS — I2510 Atherosclerotic heart disease of native coronary artery without angina pectoris: Secondary | ICD-10-CM | POA: Diagnosis not present

## 2018-11-20 DIAGNOSIS — M4800 Spinal stenosis, site unspecified: Secondary | ICD-10-CM | POA: Diagnosis not present

## 2018-11-20 DIAGNOSIS — I4891 Unspecified atrial fibrillation: Secondary | ICD-10-CM | POA: Diagnosis not present

## 2018-11-20 DIAGNOSIS — I10 Essential (primary) hypertension: Secondary | ICD-10-CM | POA: Diagnosis not present

## 2018-11-20 DIAGNOSIS — E782 Mixed hyperlipidemia: Secondary | ICD-10-CM | POA: Diagnosis not present

## 2018-12-26 DIAGNOSIS — Z23 Encounter for immunization: Secondary | ICD-10-CM | POA: Diagnosis not present

## 2019-02-07 ENCOUNTER — Ambulatory Visit: Payer: Medicare HMO | Admitting: Interventional Cardiology

## 2019-05-04 NOTE — Progress Notes (Signed)
Cardiology Office Note:    Date:  05/05/2019   ID:  Hassan, Aloisi 1940/09/21, MRN [id number]  PCP:  Josetta Huddle, MD Cardiologist:  Sinclair Grooms, MD   Referring MD: Josetta Huddle, MD   Chief Complaint  Patient presents with  . Coronary Artery Disease  . Atrial Fibrillation  . Hypertension    History of Present Illness:    Albert Short is a 79 y.o. male with a hx of CAD s/p CABG 4 in 2012, hypertension, diabetes mellitus, PAF on coumadin, sinus brady and obesity and A/CKD for followup.   He voices no complaints.  His weight is slightly less than October 2019.  Overall he feels well.  He has been under stress.  He is compliant with his medication regimen.  He has not had angina, palpitations, syncope, fluid retention, orthopnea, or PND.   Past Medical History:  Diagnosis Date  . A-fib ([place])    resolved 2006  . CAD (coronary artery disease)   . Diverticulosis   . DM type 2 (diabetes mellitus, type 2) ([place])   . GERD (gastroesophageal reflux disease)   . Hypercholesterolemia   . MI (myocardial infarction) ([place]) 04/1986   preserved OV systolic function with apical akinesis   . Obesity     Past Surgical History:  Procedure Laterality Date  . CARDIAC CATHETERIZATION  1939-05-06   Dr Daneen Schick  . CORONARY ARTERY BYPASS GRAFT    . Coronary artery bypass grafting x4  10/27/10   [facility] [facility]    Current Medications: Current Meds  Medication Sig  . Cholecalciferol (VITAMIN D3) 1000 UNITS CAPS Take 1 capsule (1,000 Units total) by mouth 2 (two) times daily.  . finasteride (PROSCAR) 5 MG tablet Take 5 mg by mouth daily.  . hydrochlorothiazide 25 MG tablet Take 12.5 mg by mouth 2 (two) times daily.   Marland Kitchen losartan (COZAAR) 100 MG tablet Take 100 mg by mouth daily.   . metFORMIN (GLUCOPHAGE) 500 MG tablet Take 500 mg by mouth daily with breakfast.   . metoprolol tartrate (LOPRESSOR) 25 MG tablet Take 1 tablet (25 mg total) by mouth 2 (two) times daily. Please make appt  with Dr. Tamala Julian for October before anymore refills. 1st attempt  . nitroGLYCERIN (NITROSTAT) 0.4 MG SL tablet Place 0.4 mg under the tongue every 5 (five) minutes as needed for chest pain (no more than 3).   . simvastatin (ZOCOR) 40 MG tablet Take 1 tablet by mouth daily.  . traMADol (ULTRAM) 50 MG tablet Take 50 mg by mouth as directed. Patient takes 50mg  twice daily, then as needed can take addt'l tablet  . warfarin (COUMADIN) 5 MG tablet Take 5 mg by mouth daily at 6 PM. Takes 7.5mg  on sun and wed  Takes 5mg  all other days  . [DISCONTINUED] amLODipine (NORVASC) 5 MG tablet Take 5 mg by mouth daily.     Allergies:   Patient has no known allergies.   Social History   Socioeconomic History  . Marital status: Married    Spouse name: Not on file  . Number of children: 2  . Years of education: Not on file  . Highest education level: Not on file  Occupational History  . Occupation: truck Geophysicist/field seismologist, retired  Tobacco Use  . Smoking status: Former Smoker    Types: Cigarettes    Quit date: 04/06/1986    Years since quitting: 33.1  . Smokeless tobacco: Never Used  Substance and Sexual Activity  . Alcohol use:  No    Comment: d/c'ed with MI in 1988  . Drug use: No  . Sexual activity: Not on file  Other Topics Concern  . Not on file  Social History Narrative  . Not on file   Social Determinants of Health   Financial Resource Strain:   . Difficulty of Paying Living Expenses: Not on file  Food Insecurity:   . Worried About Charity fundraiser in the Last Year: Not on file  . Ran Out of Food in the Last Year: Not on file  Transportation Needs:   . Lack of Transportation (Medical): Not on file  . Lack of Transportation (Non-Medical): Not on file  Physical Activity:   . Days of Exercise per Week: Not on file  . Minutes of Exercise per Session: Not on file  Stress:   . Feeling of Stress : Not on file  Social Connections:   . Frequency of Communication with Friends and Family: Not on file   . Frequency of Social Gatherings with Friends and Family: Not on file  . Attends Religious Services: Not on file  . Active Member of Clubs or Organizations: Not on file  . Attends Archivist Meetings: Not on file  . Marital Status: Not on file     Family History: The patient's family history includes Alcoholism in his father; Diabetes in his mother; Heart disease in his brother and paternal grandfather.  ROS:   Please see the history of present illness.    Awakens frequently to urinate.  This happens 3-5 times a night depending upon what he is eaten before going to bed.  All other systems reviewed and are negative.  EKGs/Labs/Other Studies Reviewed:    The following studies were reviewed today: No new cardiac imaging or functional data  EKG:  EKG sinus rhythm, old anterior infarction.  Low voltage.  No change compared to prior.  Recent Labs: No results found for requested labs within last 8760 hours.  Recent Lipid Panel No results found for: CHOL, TRIG, HDL, CHOLHDL, VLDL, LDLCALC, LDLDIRECT  Physical Exam:    VS:  BP (!) 182/78   Pulse (!) 57   Ht 5\' 9"  (1.753 m)   Wt 241 lb (109.3 kg)   SpO2 97%   BMI 35.59 kg/m     Wt Readings from Last 3 Encounters:  05/05/19 241 lb (109.3 kg)  12/26/17 244 lb 6.4 oz (110.9 kg)  12/08/16 253 lb 12.8 oz (115.1 kg)     GEN: Moderate obesity. No acute distress HEENT: Normal NECK: No JVD. LYMPHATICS: No lymphadenopathy CARDIAC:  RRR without murmur, gallop, or edema. VASCULAR:  Normal Pulses. No bruits. RESPIRATORY:  Clear to auscultation without rales, wheezing or rhonchi  ABDOMEN: Soft, non-tender, non-distended, No pulsatile mass, MUSCULOSKELETAL: No deformity  SKIN: Warm and dry NEUROLOGIC:  Alert and oriented x 3 PSYCHIATRIC:  Normal affect   ASSESSMENT:    1. Chronic diastolic heart failure ([place])   2. Essential hypertension   3. Paroxysmal atrial fibrillation ([facility])   4. Coronary artery disease involving  native coronary artery of native heart without angina pectoris   5. Hypercholesterolemia   6. Educated about COVID-19 virus infection    PLAN:    In order of problems listed above:  1. No evidence of volume overload.  Low-salt diet is recommended. 2. Increase amlodipine to 10 mg/day.  Be on the look out for lower extremity swelling.  Decrease sodium intake.  Avoid nonsteroidal anti-inflammatory agents.  He does  not drink.  2-week follow-up in blood pressure clinic.  A basic metabolic panel will be obtained today to check kidney function. 3. Continue Coumadin as prophylaxis against paroxysmal atrial fibrillation induced systemic emboli 4. Secondary prevention discussed in detail with the patient 5. Lipid panel will be obtained today. 6. 3W's is being practiced and he awaits the COVID-19 vaccine.  Overall education and awareness concerning primary/secondary risk prevention was discussed in detail: LDL less than 70, hemoglobin A1c less than 7, blood pressure target less than 130/80 mmHg, >150 minutes of moderate aerobic activity per week, avoidance of smoking, weight control (via diet and exercise), and continued surveillance/management of/for obstructive sleep apnea.    Medication Adjustments/Labs and Tests Ordered: Current medicines are reviewed at length with the patient today.  Concerns regarding medicines are outlined above.  Orders Placed This Encounter  Procedures  . Basic metabolic panel  . Hepatic function panel  . HgB A1c  . EKG 12-Lead   Meds ordered this encounter  Medications  . amLODipine (NORVASC) 10 MG tablet    Sig: Take 1 tablet (10 mg total) by mouth daily.    Dispense:  90 tablet    Refill:  3    Dose change    Patient Instructions  Medication Instructions:  1) INCREASE Amlodipine to 10mg  once daily  *If you need a refill on your cardiac medications before your next appointment, please call your pharmacy*   Lab Work: BMET, Liver and A1C today  If you  have labs (blood work) drawn today and your tests are completely normal, you will receive your results only by: Marland Kitchen MyChart Message (if you have MyChart) OR . A paper copy in the mail If you have any lab test that is abnormal or we need to change your treatment, we will call you to review the results.   Testing/Procedures: None   Follow-Up:  Your physician recommends that you schedule a follow-up appointment in: 2 weeks with the Hypertension Clinic.    At [facility], you and your health needs are our priority.  As part of our continuing mission to provide you with exceptional heart care, we have created designated Provider Care Teams.  These Care Teams include your primary Cardiologist (physician) and Advanced Practice Providers (APPs -  Physician Assistants and Nurse Practitioners) who all work together to provide you with the care you need, when you need it.  We recommend signing up for the patient portal called "MyChart".  Sign up information is provided on this After Visit Summary.  MyChart is used to connect with patients for Virtual Visits (Telemedicine).  Patients are able to view lab/test results, encounter notes, upcoming appointments, etc.  Non-urgent messages can be sent to your provider as well.   To learn more about what you can do with MyChart, go to [email or web address].    Your next appointment:   1 year(s)  The format for your next appointment:   In Person  Provider:   You may see Sinclair Grooms, MD or one of the following Advanced Practice Providers on your designated Care Team:    Truitt Merle, NP  Cecilie Kicks, NP  Kathyrn Drown, NP    Other Instructions   Low-Sodium Eating Plan Sodium, which is an element that makes up salt, helps you maintain a healthy balance of fluids in your body. Too much sodium can increase your blood pressure and cause fluid and waste to be held in your body. Your health care provider or dietitian may  recommend following  this plan if you have high blood pressure (hypertension), kidney disease, liver disease, or heart failure. Eating less sodium can help lower your blood pressure, reduce swelling, and protect your heart, liver, and kidneys. What are tips for following this plan? General guidelines  Most people on this plan should limit their sodium intake to 1,500-2,000 mg (milligrams) of sodium each day. Reading food labels   The Nutrition Facts label lists the amount of sodium in one serving of the food. If you eat more than one serving, you must multiply the listed amount of sodium by the number of servings.  Choose foods with less than 140 mg of sodium per serving.  Avoid foods with 300 mg of sodium or more per serving. Shopping  Look for lower-sodium products, often labeled as "low-sodium" or "no salt added."  Always check the sodium content even if foods are labeled as "unsalted" or "no salt added".  Buy fresh foods. ? Avoid canned foods and premade or frozen meals. ? Avoid canned, cured, or processed meats  Buy breads that have less than 80 mg of sodium per slice. Cooking  Eat more home-cooked food and less restaurant, buffet, and fast food.  Avoid adding salt when cooking. Use salt-free seasonings or herbs instead of table salt or sea salt. Check with your health care provider or pharmacist before using salt substitutes.  Cook with plant-based oils, such as canola, sunflower, or olive oil. Meal planning  When eating at a restaurant, ask that your food be prepared with less salt or no salt, if possible.  Avoid foods that contain MSG (monosodium glutamate). MSG is sometimes added to [place] food, bouillon, and some canned foods. What foods are recommended? The items listed may not be a complete list. Talk with your dietitian about what dietary choices are best for you. Grains Low-sodium cereals, including oats, puffed wheat and rice, and shredded wheat. Low-sodium crackers. Unsalted rice.  Unsalted pasta. Low-sodium bread. Whole-grain breads and whole-grain pasta. Vegetables Fresh or frozen vegetables. "No salt added" canned vegetables. "No salt added" tomato sauce and paste. Low-sodium or reduced-sodium tomato and vegetable juice. Fruits Fresh, frozen, or canned fruit. Fruit juice. Meats and other protein foods Fresh or frozen (no salt added) meat, poultry, seafood, and fish. Low-sodium canned tuna and salmon. Unsalted nuts. Dried peas, beans, and lentils without added salt. Unsalted canned beans. Eggs. Unsalted nut butters. Dairy Milk. Soy milk. Cheese that is naturally low in sodium, such as ricotta cheese, fresh mozzarella, or Swiss cheese Low-sodium or reduced-sodium cheese. Cream cheese. Yogurt. Fats and oils Unsalted butter. Unsalted margarine with no trans fat. Vegetable oils such as canola or olive oils. Seasonings and other foods Fresh and dried herbs and spices. Salt-free seasonings. Low-sodium mustard and ketchup. Sodium-free salad dressing. Sodium-free light mayonnaise. Fresh or refrigerated horseradish. Lemon juice. Vinegar. Homemade, reduced-sodium, or low-sodium soups. Unsalted popcorn and pretzels. Low-salt or salt-free chips. What foods are not recommended? The items listed may not be a complete list. Talk with your dietitian about what dietary choices are best for you. Grains Instant hot cereals. Bread stuffing, pancake, and biscuit mixes. Croutons. Seasoned rice or pasta mixes. Noodle soup cups. Boxed or frozen macaroni and cheese. Regular salted crackers. Self-rising flour. Vegetables Sauerkraut, pickled vegetables, and relishes. Olives. [place] fries. Onion rings. Regular canned vegetables (not low-sodium or reduced-sodium). Regular canned tomato sauce and paste (not low-sodium or reduced-sodium). Regular tomato and vegetable juice (not low-sodium or reduced-sodium). Frozen vegetables in sauces. Meats and other protein  foods Meat or fish that is salted, canned,  smoked, spiced, or pickled. Bacon, ham, sausage, hotdogs, corned beef, chipped beef, packaged lunch meats, salt pork, jerky, pickled herring, anchovies, regular canned tuna, sardines, salted nuts. Dairy Processed cheese and cheese spreads. Cheese curds. Blue cheese. Feta cheese. String cheese. Regular cottage cheese. Buttermilk. Canned milk. Fats and oils Salted butter. Regular margarine. Ghee. Bacon fat. Seasonings and other foods Onion salt, garlic salt, seasoned salt, table salt, and sea salt. Canned and packaged gravies. Worcestershire sauce. Tartar sauce. Barbecue sauce. Teriyaki sauce. Soy sauce, including reduced-sodium. Steak sauce. Fish sauce. Oyster sauce. Cocktail sauce. Horseradish that you find on the shelf. Regular ketchup and mustard. Meat flavorings and tenderizers. Bouillon cubes. Hot sauce and Tabasco sauce. Premade or packaged marinades. Premade or packaged taco seasonings. Relishes. Regular salad dressings. Salsa. Potato and tortilla chips. Corn chips and puffs. Salted popcorn and pretzels. Canned or dried soups. Pizza. Frozen entrees and pot pies. Summary  Eating less sodium can help lower your blood pressure, reduce swelling, and protect your heart, liver, and kidneys.  Most people on this plan should limit their sodium intake to 1,500-2,000 mg (milligrams) of sodium each day.  Canned, boxed, and frozen foods are high in sodium. Restaurant foods, fast foods, and pizza are also very high in sodium. You also get sodium by adding salt to food.  Try to cook at home, eat more fresh fruits and vegetables, and eat less fast food, canned, processed, or prepared foods. This information is not intended to replace advice given to you by your health care provider. Make sure you discuss any questions you have with your health care provider. Document Revised: 02/02/2017 Document Reviewed: 02/14/2016 Elsevier Patient Education  2020 [employer].      Signed, Sinclair Grooms, MD   05/05/2019 8:42 AM    [place]

## 2019-05-05 ENCOUNTER — Ambulatory Visit: Payer: Medicare HMO | Admitting: Interventional Cardiology

## 2019-05-05 ENCOUNTER — Other Ambulatory Visit: Payer: Self-pay

## 2019-05-05 ENCOUNTER — Encounter: Payer: Self-pay | Admitting: Interventional Cardiology

## 2019-05-05 VITALS — BP 182/78 | HR 57 | Ht 69.0 in | Wt 241.0 lb

## 2019-05-05 DIAGNOSIS — I2510 Atherosclerotic heart disease of native coronary artery without angina pectoris: Secondary | ICD-10-CM

## 2019-05-05 DIAGNOSIS — Z7189 Other specified counseling: Secondary | ICD-10-CM

## 2019-05-05 DIAGNOSIS — E7800 Pure hypercholesterolemia, unspecified: Secondary | ICD-10-CM

## 2019-05-05 DIAGNOSIS — I5032 Chronic diastolic (congestive) heart failure: Secondary | ICD-10-CM | POA: Diagnosis not present

## 2019-05-05 DIAGNOSIS — I480 Paroxysmal atrial fibrillation: Secondary | ICD-10-CM | POA: Diagnosis not present

## 2019-05-05 DIAGNOSIS — I10 Essential (primary) hypertension: Secondary | ICD-10-CM | POA: Diagnosis not present

## 2019-05-05 DIAGNOSIS — E1100 Type 2 diabetes mellitus with hyperosmolarity without nonketotic hyperglycemic-hyperosmolar coma (NKHHC): Secondary | ICD-10-CM | POA: Diagnosis not present

## 2019-05-05 LAB — HEPATIC FUNCTION PANEL
ALT: 19 IU/L (ref 0–44)
AST: 17 IU/L (ref 0–40)
Albumin: 4.5 g/dL (ref 3.7–4.7)
Alkaline Phosphatase: 103 IU/L (ref 39–117)
Bilirubin Total: 0.4 mg/dL (ref 0.0–1.2)
Bilirubin, Direct: 0.12 mg/dL (ref 0.00–0.40)
Total Protein: 6.8 g/dL (ref 6.0–8.5)

## 2019-05-05 LAB — BASIC METABOLIC PANEL
BUN/Creatinine Ratio: 13 (ref 10–24)
BUN: 18 mg/dL (ref 8–27)
CO2: 20 mmol/L (ref 20–29)
Calcium: 9.2 mg/dL (ref 8.6–10.2)
Chloride: 97 mmol/L (ref 96–106)
Creatinine, Ser: 1.43 mg/dL — ABNORMAL HIGH (ref 0.76–1.27)
GFR calc Af Amer: 53 mL/min/{1.73_m2} — ABNORMAL LOW (ref >59)
GFR calc non Af Amer: 46 mL/min/{1.73_m2} — ABNORMAL LOW (ref >59)
Glucose: 149 mg/dL — ABNORMAL HIGH (ref 65–99)
Potassium: 4.4 mmol/L (ref 3.5–5.2)
Sodium: 131 mmol/L — ABNORMAL LOW (ref 134–144)

## 2019-05-05 LAB — LIPID PANEL
Chol/HDL Ratio: 3 ratio (ref 0.0–5.0)
Cholesterol, Total: 104 mg/dL (ref 100–199)
HDL: 35 mg/dL — ABNORMAL LOW (ref >39)
LDL Chol Calc (NIH): 47 mg/dL (ref 0–99)
Triglycerides: 121 mg/dL (ref 0–149)
VLDL Cholesterol Cal: 22 mg/dL (ref 5–40)

## 2019-05-05 LAB — HEMOGLOBIN A1C
Est. average glucose Bld gHb Est-mCnc: 146 mg/dL
Hgb A1c MFr Bld: 6.7 % — ABNORMAL HIGH (ref 4.8–5.6)

## 2019-05-05 MED ORDER — AMLODIPINE BESYLATE 10 MG PO TABS: 10.0000 mg | ORAL_TABLET | Freq: Every day | ORAL | 3 refills | Status: DC

## 2019-05-05 NOTE — Patient Instructions (Signed)
Medication Instructions:  1) INCREASE Amlodipine to 10mg  once daily  *If you need a refill on your cardiac medications before your next appointment, please call your pharmacy*   Lab Work: BMET, Liver and A1C today  If you have labs (blood work) drawn today and your tests are completely normal, you will receive your results only by: Marland Kitchen MyChart Message (if you have MyChart) OR . A paper copy in the mail If you have any lab test that is abnormal or we need to change your treatment, we will call you to review the results.   Testing/Procedures: None   Follow-Up:  Your physician recommends that you schedule a follow-up appointment in: 2 weeks with the Hypertension Clinic.    At [facility], you and your health needs are our priority.  As part of our continuing mission to provide you with exceptional heart care, we have created designated Provider Care Teams.  These Care Teams include your primary Cardiologist (physician) and Advanced Practice Providers (APPs -  Physician Assistants and Nurse Practitioners) who all work together to provide you with the care you need, when you need it.  We recommend signing up for the patient portal called "MyChart".  Sign up information is provided on this After Visit Summary.  MyChart is used to connect with patients for Virtual Visits (Telemedicine).  Patients are able to view lab/test results, encounter notes, upcoming appointments, etc.  Non-urgent messages can be sent to your provider as well.   To learn more about what you can do with MyChart, go to [email or web address].    Your next appointment:   1 year(s)  The format for your next appointment:   In Person  Provider:   You may see Sinclair Grooms, MD or one of the following Advanced Practice Providers on your designated Care Team:    Truitt Merle, NP  Cecilie Kicks, NP  Kathyrn Drown, NP    Other Instructions   Low-Sodium Eating Plan Sodium, which is an element that makes up  salt, helps you maintain a healthy balance of fluids in your body. Too much sodium can increase your blood pressure and cause fluid and waste to be held in your body. Your health care provider or dietitian may recommend following this plan if you have high blood pressure (hypertension), kidney disease, liver disease, or heart failure. Eating less sodium can help lower your blood pressure, reduce swelling, and protect your heart, liver, and kidneys. What are tips for following this plan? General guidelines  Most people on this plan should limit their sodium intake to 1,500-2,000 mg (milligrams) of sodium each day. Reading food labels   The Nutrition Facts label lists the amount of sodium in one serving of the food. If you eat more than one serving, you must multiply the listed amount of sodium by the number of servings.  Choose foods with less than 140 mg of sodium per serving.  Avoid foods with 300 mg of sodium or more per serving. Shopping  Look for lower-sodium products, often labeled as "low-sodium" or "no salt added."  Always check the sodium content even if foods are labeled as "unsalted" or "no salt added".  Buy fresh foods. ? Avoid canned foods and premade or frozen meals. ? Avoid canned, cured, or processed meats  Buy breads that have less than 80 mg of sodium per slice. Cooking  Eat more home-cooked food and less restaurant, buffet, and fast food.  Avoid adding salt when cooking. Use salt-free seasonings or herbs  instead of table salt or sea salt. Check with your health care provider or pharmacist before using salt substitutes.  Cook with plant-based oils, such as canola, sunflower, or olive oil. Meal planning  When eating at a restaurant, ask that your food be prepared with less salt or no salt, if possible.  Avoid foods that contain MSG (monosodium glutamate). MSG is sometimes added to [place] food, bouillon, and some canned foods. What foods are recommended? The  items listed may not be a complete list. Talk with your dietitian about what dietary choices are best for you. Grains Low-sodium cereals, including oats, puffed wheat and rice, and shredded wheat. Low-sodium crackers. Unsalted rice. Unsalted pasta. Low-sodium bread. Whole-grain breads and whole-grain pasta. Vegetables Fresh or frozen vegetables. "No salt added" canned vegetables. "No salt added" tomato sauce and paste. Low-sodium or reduced-sodium tomato and vegetable juice. Fruits Fresh, frozen, or canned fruit. Fruit juice. Meats and other protein foods Fresh or frozen (no salt added) meat, poultry, seafood, and fish. Low-sodium canned tuna and salmon. Unsalted nuts. Dried peas, beans, and lentils without added salt. Unsalted canned beans. Eggs. Unsalted nut butters. Dairy Milk. Soy milk. Cheese that is naturally low in sodium, such as ricotta cheese, fresh mozzarella, or Swiss cheese Low-sodium or reduced-sodium cheese. Cream cheese. Yogurt. Fats and oils Unsalted butter. Unsalted margarine with no trans fat. Vegetable oils such as canola or olive oils. Seasonings and other foods Fresh and dried herbs and spices. Salt-free seasonings. Low-sodium mustard and ketchup. Sodium-free salad dressing. Sodium-free light mayonnaise. Fresh or refrigerated horseradish. Lemon juice. Vinegar. Homemade, reduced-sodium, or low-sodium soups. Unsalted popcorn and pretzels. Low-salt or salt-free chips. What foods are not recommended? The items listed may not be a complete list. Talk with your dietitian about what dietary choices are best for you. Grains Instant hot cereals. Bread stuffing, pancake, and biscuit mixes. Croutons. Seasoned rice or pasta mixes. Noodle soup cups. Boxed or frozen macaroni and cheese. Regular salted crackers. Self-rising flour. Vegetables Sauerkraut, pickled vegetables, and relishes. Olives. [place] fries. Onion rings. Regular canned vegetables (not low-sodium or reduced-sodium). Regular  canned tomato sauce and paste (not low-sodium or reduced-sodium). Regular tomato and vegetable juice (not low-sodium or reduced-sodium). Frozen vegetables in sauces. Meats and other protein foods Meat or fish that is salted, canned, smoked, spiced, or pickled. Bacon, ham, sausage, hotdogs, corned beef, chipped beef, packaged lunch meats, salt pork, jerky, pickled herring, anchovies, regular canned tuna, sardines, salted nuts. Dairy Processed cheese and cheese spreads. Cheese curds. Blue cheese. Feta cheese. String cheese. Regular cottage cheese. Buttermilk. Canned milk. Fats and oils Salted butter. Regular margarine. Ghee. Bacon fat. Seasonings and other foods Onion salt, garlic salt, seasoned salt, table salt, and sea salt. Canned and packaged gravies. Worcestershire sauce. Tartar sauce. Barbecue sauce. Teriyaki sauce. Soy sauce, including reduced-sodium. Steak sauce. Fish sauce. Oyster sauce. Cocktail sauce. Horseradish that you find on the shelf. Regular ketchup and mustard. Meat flavorings and tenderizers. Bouillon cubes. Hot sauce and Tabasco sauce. Premade or packaged marinades. Premade or packaged taco seasonings. Relishes. Regular salad dressings. Salsa. Potato and tortilla chips. Corn chips and puffs. Salted popcorn and pretzels. Canned or dried soups. Pizza. Frozen entrees and pot pies. Summary  Eating less sodium can help lower your blood pressure, reduce swelling, and protect your heart, liver, and kidneys.  Most people on this plan should limit their sodium intake to 1,500-2,000 mg (milligrams) of sodium each day.  Canned, boxed, and frozen foods are high in sodium. Restaurant foods, fast foods, and  pizza are also very high in sodium. You also get sodium by adding salt to food.  Try to cook at home, eat more fresh fruits and vegetables, and eat less fast food, canned, processed, or prepared foods. This information is not intended to replace advice given to you by your health care  provider. Make sure you discuss any questions you have with your health care provider. Document Revised: 02/02/2017 Document Reviewed: 02/14/2016 Elsevier Patient Education  2020 [employer].

## 2019-05-11 ENCOUNTER — Ambulatory Visit: Payer: Medicare HMO | Attending: Internal Medicine

## 2019-05-11 DIAGNOSIS — Z23 Encounter for immunization: Secondary | ICD-10-CM | POA: Insufficient documentation

## 2019-05-11 NOTE — Progress Notes (Signed)
   Covid-19 Vaccination Clinic  Name:  UNNAMED DAZEY    MRN: [id number] DOB: [birth year]  05/11/2019  Mr. Livecchi was observed post Covid-19 immunization for 15 minutes without incident. He was provided with Vaccine Information Sheet and instruction to access the V-Safe system.   Mr. Leal was instructed to call 911 with any severe reactions post vaccine: Marland Kitchen Difficulty breathing  . Swelling of face and throat  . A fast heartbeat  . A bad rash all over body  . Dizziness and weakness   Immunizations Administered    Name Date Dose VIS Date Route   Pfizer COVID-19 Vaccine 05/11/2019 10:02 AM 0.3 mL 02/14/2019 Intramuscular   Manufacturer: [place]   Lot: [id number]   [place]: [id number]

## 2019-05-20 NOTE — Progress Notes (Signed)
Patient ID: Albert Short                 DOB: [birth year]                      MRN: [id number]     HPI: Albert Short is a 79 y.o. male referred by Dr. Tamala Julian to HTN clinic. PMH is significant for HTN, CAD s/p CABGx4 in 2012, pAF on coumadin, sinus brady, DM, obesity, and CKD. Patient was last seen by Dr. Tamala Julian on 05/05/19 and his BP was elevated at 182/78, so his amlodipine was increased to 10mg  daily.  Patient arrives today for initial visit. He is tolerating amlodipine at the higher dose well and denies any LEE. He measures his BP at home daily and brought his BP cuff to visit today, and his home BP cuff seems to run 5-10 mmHg higher than the clinic reading. He states that his memory is not very good and is unable to recall how he takes each of his medications, but he states that he takes them as they are prescribed. He feels good on his current medications despite his high BP and does not want to make any changes to his medications without discussing first with his PCP, Dr. Inda Merlin.  His physical activity is limited due to chronic hip pain, but he tries to walk for 5-10 minutes around the block when the weather is nice. He does not cook food at home and states that he eats "junk food," primarily canned/frozen foods that are not low in sodium. He also drinks Diet Cola and decaf coffee. He continues to be stressed and prefers not to discuss in detail. He is working on this with Dr. Inda Merlin.   He is currently on warfarin for anticoagulation but has not had his INR checked by his PCP office in almost one year per patient.   Home BP Cuff: 195/89, HR 58; 165/78 and 175/76 on recheck Clinic BP Cuff: 156/60  Current HTN meds: amlodipine 10mg  daily, HCTZ 12.5mg  BID, losartan 100mg  daily, metoprolol tartrate 25mg  BID  Previously tried: atenolol 25mg  daily, felodipine 10mg  daily, olmesartan 10mg  daily, valsartan 160mg  daily, valsartan-HCTZ 325/12.5mg  daily  BP goal: <130/80 mmHg  Family History: The  patient's family history includes Alcoholism in his father; Diabetes in his mother; Heart disease in his brother and paternal grandfather.  Social History: Former smoker  Diet: "Mostly junk food." Eats rice krispies cereal for breakfast, canned chicken noodle soup for lunch (regular sodium), drinks Diet Cola and 2 cups of decaf coffee daily. Enjoys sliced cheese.  Exercise: Limited due to hip pain. Walks 5-10 minutes at least 3 times/week depending on the weather. Mows grass and cuts the bushes.  Home BP readings: 154/72, 165/74, 161/77, 171/75, 134/75 - SBP typically ranging 140-150s with HR 50s  Wt Readings from Last 3 Encounters:  05/05/19 241 lb (109.3 kg)  12/26/17 244 lb 6.4 oz (110.9 kg)  12/08/16 253 lb 12.8 oz (115.1 kg)   BP Readings from Last 3 Encounters:  05/05/19 (!) 182/78  12/26/17 (!) 148/74  12/08/16 136/76   Pulse Readings from Last 3 Encounters:  05/05/19 (!) 57  12/26/17 (!) 52  12/08/16 (!) 49    Renal function: CrCl cannot be calculated (Unknown ideal weight.).  Past Medical History:  Diagnosis Date  . A-fib ([place])    resolved 2006  . CAD (coronary artery disease)   . Diverticulosis   . DM type 2 (diabetes mellitus, type  2) ([place])   . GERD (gastroesophageal reflux disease)   . Hypercholesterolemia   . MI (myocardial infarction) ([place]) 04/1986   preserved OV systolic function with apical akinesis   . Obesity     Current Outpatient Medications on File Prior to Visit  Medication Sig Dispense Refill  . amLODipine (NORVASC) 10 MG tablet Take 1 tablet (10 mg total) by mouth daily. 90 tablet 3  . Cholecalciferol (VITAMIN D3) 1000 UNITS CAPS Take 1 capsule (1,000 Units total) by mouth 2 (two) times daily.    . finasteride (PROSCAR) 5 MG tablet Take 5 mg by mouth daily.    . hydrochlorothiazide 25 MG tablet Take 12.5 mg by mouth 2 (two) times daily.     Marland Kitchen losartan (COZAAR) 100 MG tablet Take 100 mg by mouth daily.     . metFORMIN (GLUCOPHAGE) 500 MG tablet  Take 500 mg by mouth daily with breakfast.     . metoprolol tartrate (LOPRESSOR) 25 MG tablet Take 1 tablet (25 mg total) by mouth 2 (two) times daily. Please make appt with Dr. Tamala Julian for October before anymore refills. 1st attempt 180 tablet 0  . nitroGLYCERIN (NITROSTAT) 0.4 MG SL tablet Place 0.4 mg under the tongue every 5 (five) minutes as needed for chest pain (no more than 3).     . simvastatin (ZOCOR) 40 MG tablet Take 1 tablet by mouth daily.    . traMADol (ULTRAM) 50 MG tablet Take 50 mg by mouth as directed. Patient takes 50mg  twice daily, then as needed can take addt'l tablet    . warfarin (COUMADIN) 5 MG tablet Take 5 mg by mouth daily at 6 PM. Takes 7.5mg  on sun and wed  Takes 5mg  all other days     No current facility-administered medications on file prior to visit.    No Known Allergies   Assessment/Plan:  1. Hypertension - BP remains elevated and above goal of <130/80 mmHg. Patient does not want to make any medication changes without discussing with Dr. Inda Merlin at his next follow-up appointment in April. Recommended that he change losartan to irbesartan 300mg  daily and change metoprolol tartrate to carvedilol 25mg  BID for more optimal BP lowering. Other options include starting spironolactone 25mg  daily. Pt will discuss making these changes with Dr. Inda Merlin. Educated patient on his BP goal and the significance and risks that come with his high BP. Discussed proper BP measuring technique. His home BP cuff seems to run 5-10 mmHg higher than clinic readings. Educated patient on the importance of regular exercise and maintaining a diet low in sodium and caffeine. Pt will limit his Diet Cola intake and look for canned/frozen foods low in sodium. Follow up in pharmacy clinic as needed since pt prefers that medication changes come from PCP.  2. Anticoagulation - Patient on warfarin for atrial fibrillation but has not had INR checked in almost 1 year per pt recollection. Discussed benefits of  changing to an alternative anticoagulant like Eliquis with improved safety and efficacy data, as well as reduced need for monitoring. His copay would be $45/month with his current insurance plan. He will discuss with Dr. Inda Merlin and may consider switching.  Richardine Service, PharmD PGY1 Pharmacy Resident  [place]. Supple, PharmD, BCACP, [place] [id number], [place], [place] [address] Phone: [phone]; Fax: [phone] 05/21/2019 11:57 AM

## 2019-05-21 ENCOUNTER — Ambulatory Visit (INDEPENDENT_AMBULATORY_CARE_PROVIDER_SITE_OTHER): Payer: Medicare HMO | Admitting: Pharmacist

## 2019-05-21 ENCOUNTER — Other Ambulatory Visit: Payer: Self-pay

## 2019-05-21 DIAGNOSIS — I10 Essential (primary) hypertension: Secondary | ICD-10-CM | POA: Diagnosis not present

## 2019-05-21 NOTE — Patient Instructions (Addendum)
It was nice to meet you today!  Your blood pressure is 156/60 and your goal is < 130/80  Continue taking amlodipine 10mg  daily, HCTZ 12.5mg  twice daily, losartan 100mg  daily, and metoprolol tartrate 25mg  twice daily  Here are the changes I recommend you discuss with Dr. Inda Merlin at your next follow-up appointment in April: - Change losartan to irbesartan 300mg  daily  - Change metoprolol tartrate to carvedilol 25mg  twice daily - Change warfarin to Eliquis (this would have a $45 copay each month but works better than warfarin, is safer, and requires much less monitoring)  Try to maintain a regular exercise regimen and stick to a heart-healthy, low-sodium diet to help lower blood pressure.  Please call Altha Harm or Jinny Blossom at [phone] if you have any questions

## 2019-06-09 ENCOUNTER — Ambulatory Visit: Payer: Medicare HMO | Attending: Internal Medicine

## 2019-06-09 DIAGNOSIS — Z23 Encounter for immunization: Secondary | ICD-10-CM

## 2019-06-09 NOTE — Progress Notes (Signed)
   Covid-19 Vaccination Clinic  Name:  Albert Short    MRN: [id number] DOB: 04/29/1940  06/09/2019  [name] was observed post Covid-19 immunization for 15 minutes without incident. He was provided with Vaccine Information Sheet and instruction to access the V-Safe system.   [name] was instructed to call 911 with any severe reactions post vaccine: Marland Kitchen Difficulty breathing  . Swelling of face and throat  . A fast heartbeat  . A bad rash all over body  . Dizziness and weakness   Immunizations Administered    Name Date Dose VIS Date Route   Pfizer COVID-19 Vaccine 06/09/2019 10:47 AM 0.3 mL 02/14/2019 Intramuscular   Manufacturer: [place]   Lot: [id number]   [place]: [id number]

## 2019-06-10 DIAGNOSIS — I110 Hypertensive heart disease with heart failure: Secondary | ICD-10-CM | POA: Diagnosis not present

## 2019-06-10 DIAGNOSIS — Z0000 Encounter for general adult medical examination without abnormal findings: Secondary | ICD-10-CM | POA: Diagnosis not present

## 2019-06-10 DIAGNOSIS — E782 Mixed hyperlipidemia: Secondary | ICD-10-CM | POA: Diagnosis not present

## 2019-06-10 DIAGNOSIS — I739 Peripheral vascular disease, unspecified: Secondary | ICD-10-CM | POA: Diagnosis not present

## 2019-06-10 DIAGNOSIS — R202 Paresthesia of skin: Secondary | ICD-10-CM | POA: Diagnosis not present

## 2019-06-10 DIAGNOSIS — I10 Essential (primary) hypertension: Secondary | ICD-10-CM | POA: Diagnosis not present

## 2019-06-10 DIAGNOSIS — Z1389 Encounter for screening for other disorder: Secondary | ICD-10-CM | POA: Diagnosis not present

## 2019-06-10 DIAGNOSIS — E1159 Type 2 diabetes mellitus with other circulatory complications: Secondary | ICD-10-CM | POA: Diagnosis not present

## 2019-06-10 DIAGNOSIS — D6869 Other thrombophilia: Secondary | ICD-10-CM | POA: Diagnosis not present

## 2019-06-10 DIAGNOSIS — I4891 Unspecified atrial fibrillation: Secondary | ICD-10-CM | POA: Diagnosis not present

## 2019-06-10 DIAGNOSIS — E559 Vitamin D deficiency, unspecified: Secondary | ICD-10-CM | POA: Diagnosis not present

## 2019-06-10 DIAGNOSIS — I5032 Chronic diastolic (congestive) heart failure: Secondary | ICD-10-CM | POA: Diagnosis not present

## 2019-06-10 DIAGNOSIS — E1151 Type 2 diabetes mellitus with diabetic peripheral angiopathy without gangrene: Secondary | ICD-10-CM | POA: Diagnosis not present

## 2019-06-10 DIAGNOSIS — E1140 Type 2 diabetes mellitus with diabetic neuropathy, unspecified: Secondary | ICD-10-CM | POA: Diagnosis not present

## 2019-08-28 ENCOUNTER — Other Ambulatory Visit: Payer: Self-pay

## 2019-08-28 MED ORDER — AMLODIPINE BESYLATE 10 MG PO TABS: 10.0000 mg | ORAL_TABLET | Freq: Every day | ORAL | 2 refills | Status: DC

## 2019-09-23 DIAGNOSIS — M4800 Spinal stenosis, site unspecified: Secondary | ICD-10-CM | POA: Diagnosis not present

## 2020-01-01 DIAGNOSIS — E559 Vitamin D deficiency, unspecified: Secondary | ICD-10-CM | POA: Diagnosis not present

## 2020-01-01 DIAGNOSIS — I2510 Atherosclerotic heart disease of native coronary artery without angina pectoris: Secondary | ICD-10-CM | POA: Diagnosis not present

## 2020-01-01 DIAGNOSIS — M4800 Spinal stenosis, site unspecified: Secondary | ICD-10-CM | POA: Diagnosis not present

## 2020-01-01 DIAGNOSIS — E1140 Type 2 diabetes mellitus with diabetic neuropathy, unspecified: Secondary | ICD-10-CM | POA: Diagnosis not present

## 2020-01-01 DIAGNOSIS — E782 Mixed hyperlipidemia: Secondary | ICD-10-CM | POA: Diagnosis not present

## 2020-01-01 DIAGNOSIS — D6869 Other thrombophilia: Secondary | ICD-10-CM | POA: Diagnosis not present

## 2020-01-01 DIAGNOSIS — I10 Essential (primary) hypertension: Secondary | ICD-10-CM | POA: Diagnosis not present

## 2020-01-01 DIAGNOSIS — N401 Enlarged prostate with lower urinary tract symptoms: Secondary | ICD-10-CM | POA: Diagnosis not present

## 2020-01-01 DIAGNOSIS — I480 Paroxysmal atrial fibrillation: Secondary | ICD-10-CM | POA: Diagnosis not present

## 2020-02-12 DIAGNOSIS — I4891 Unspecified atrial fibrillation: Secondary | ICD-10-CM | POA: Diagnosis not present

## 2020-02-26 ENCOUNTER — Other Ambulatory Visit: Payer: Self-pay | Admitting: Interventional Cardiology

## 2020-03-15 DIAGNOSIS — Z7901 Long term (current) use of anticoagulants: Secondary | ICD-10-CM | POA: Diagnosis not present

## 2020-03-29 DIAGNOSIS — N401 Enlarged prostate with lower urinary tract symptoms: Secondary | ICD-10-CM | POA: Diagnosis not present

## 2020-03-29 DIAGNOSIS — D6869 Other thrombophilia: Secondary | ICD-10-CM | POA: Diagnosis not present

## 2020-03-29 DIAGNOSIS — E559 Vitamin D deficiency, unspecified: Secondary | ICD-10-CM | POA: Diagnosis not present

## 2020-03-29 DIAGNOSIS — E1140 Type 2 diabetes mellitus with diabetic neuropathy, unspecified: Secondary | ICD-10-CM | POA: Diagnosis not present

## 2020-03-29 DIAGNOSIS — M4800 Spinal stenosis, site unspecified: Secondary | ICD-10-CM | POA: Diagnosis not present

## 2020-03-29 DIAGNOSIS — I10 Essential (primary) hypertension: Secondary | ICD-10-CM | POA: Diagnosis not present

## 2020-03-29 DIAGNOSIS — E782 Mixed hyperlipidemia: Secondary | ICD-10-CM | POA: Diagnosis not present

## 2020-03-29 DIAGNOSIS — I480 Paroxysmal atrial fibrillation: Secondary | ICD-10-CM | POA: Diagnosis not present

## 2020-03-29 DIAGNOSIS — I2510 Atherosclerotic heart disease of native coronary artery without angina pectoris: Secondary | ICD-10-CM | POA: Diagnosis not present

## 2020-04-26 DIAGNOSIS — I480 Paroxysmal atrial fibrillation: Secondary | ICD-10-CM | POA: Diagnosis not present

## 2020-05-10 ENCOUNTER — Other Ambulatory Visit: Payer: Self-pay | Admitting: Interventional Cardiology

## 2020-05-31 DIAGNOSIS — I4891 Unspecified atrial fibrillation: Secondary | ICD-10-CM | POA: Diagnosis not present

## 2020-06-30 ENCOUNTER — Other Ambulatory Visit: Payer: Self-pay | Admitting: Internal Medicine

## 2020-06-30 DIAGNOSIS — R350 Frequency of micturition: Secondary | ICD-10-CM | POA: Diagnosis not present

## 2020-06-30 DIAGNOSIS — D6869 Other thrombophilia: Secondary | ICD-10-CM | POA: Diagnosis not present

## 2020-06-30 DIAGNOSIS — R531 Weakness: Secondary | ICD-10-CM

## 2020-06-30 DIAGNOSIS — E782 Mixed hyperlipidemia: Secondary | ICD-10-CM | POA: Diagnosis not present

## 2020-06-30 DIAGNOSIS — M4800 Spinal stenosis, site unspecified: Secondary | ICD-10-CM | POA: Diagnosis not present

## 2020-06-30 DIAGNOSIS — R8290 Unspecified abnormal findings in urine: Secondary | ICD-10-CM | POA: Diagnosis not present

## 2020-06-30 DIAGNOSIS — I10 Essential (primary) hypertension: Secondary | ICD-10-CM | POA: Diagnosis not present

## 2020-06-30 DIAGNOSIS — R1032 Left lower quadrant pain: Secondary | ICD-10-CM | POA: Diagnosis not present

## 2020-06-30 DIAGNOSIS — Z0000 Encounter for general adult medical examination without abnormal findings: Secondary | ICD-10-CM | POA: Diagnosis not present

## 2020-06-30 DIAGNOSIS — I480 Paroxysmal atrial fibrillation: Secondary | ICD-10-CM | POA: Diagnosis not present

## 2020-06-30 DIAGNOSIS — Z1389 Encounter for screening for other disorder: Secondary | ICD-10-CM | POA: Diagnosis not present

## 2020-06-30 DIAGNOSIS — Z79899 Other long term (current) drug therapy: Secondary | ICD-10-CM | POA: Diagnosis not present

## 2020-06-30 DIAGNOSIS — E1140 Type 2 diabetes mellitus with diabetic neuropathy, unspecified: Secondary | ICD-10-CM | POA: Diagnosis not present

## 2020-06-30 DIAGNOSIS — N401 Enlarged prostate with lower urinary tract symptoms: Secondary | ICD-10-CM | POA: Diagnosis not present

## 2020-06-30 DIAGNOSIS — E559 Vitamin D deficiency, unspecified: Secondary | ICD-10-CM | POA: Diagnosis not present

## 2020-07-01 ENCOUNTER — Other Ambulatory Visit: Payer: Self-pay | Admitting: Internal Medicine

## 2020-07-01 ENCOUNTER — Ambulatory Visit
Admission: RE | Admit: 2020-07-01 | Discharge: 2020-07-01 | Disposition: A | Discharge status: Home / Self Care | Payer: Medicare HMO | Source: Ambulatory Visit | Attending: Internal Medicine | Admitting: Internal Medicine

## 2020-07-01 DIAGNOSIS — R1084 Generalized abdominal pain: Secondary | ICD-10-CM

## 2020-07-01 DIAGNOSIS — R109 Unspecified abdominal pain: Secondary | ICD-10-CM | POA: Diagnosis not present

## 2020-07-01 DIAGNOSIS — K5730 Diverticulosis of large intestine without perforation or abscess without bleeding: Secondary | ICD-10-CM | POA: Diagnosis not present

## 2020-07-01 DIAGNOSIS — R059 Cough, unspecified: Secondary | ICD-10-CM | POA: Diagnosis not present

## 2020-07-01 DIAGNOSIS — R61 Generalized hyperhidrosis: Secondary | ICD-10-CM | POA: Diagnosis not present

## 2020-07-01 DIAGNOSIS — R531 Weakness: Secondary | ICD-10-CM

## 2020-07-01 DIAGNOSIS — R1032 Left lower quadrant pain: Secondary | ICD-10-CM

## 2020-07-06 DIAGNOSIS — N2889 Other specified disorders of kidney and ureter: Secondary | ICD-10-CM | POA: Diagnosis not present

## 2020-07-07 DIAGNOSIS — N2889 Other specified disorders of kidney and ureter: Secondary | ICD-10-CM | POA: Diagnosis not present

## 2020-07-16 ENCOUNTER — Telehealth: Payer: Self-pay | Admitting: Interventional Cardiology

## 2020-07-16 NOTE — Telephone Encounter (Signed)
*  STAT* If patient is at the pharmacy, call can be transferred to refill team.   1. Which medications need to be refilled? (please list name of each medication and dose if known) amLODipine (NORVASC) 10 MG tablet  2. Which pharmacy/location (including street and city if local pharmacy) is medication to be sent to? [facility] - [place], [place] - [address]  3. Do they need a 30 day or 90 day supply?  90 day supply    

## 2020-07-19 MED ORDER — AMLODIPINE BESYLATE 10 MG PO TABS: 10.0000 mg | ORAL_TABLET | Freq: Every day | ORAL | 0 refills | Status: DC

## 2020-07-19 NOTE — Telephone Encounter (Signed)
Pt's medication was sent to pt's pharmacy as requested. Confirmation received.  °

## 2020-10-04 ENCOUNTER — Other Ambulatory Visit: Payer: Self-pay | Admitting: Interventional Cardiology

## 2020-10-06 ENCOUNTER — Ambulatory Visit: Payer: Medicare HMO | Admitting: Physician Assistant

## 2020-10-06 ENCOUNTER — Other Ambulatory Visit: Payer: Self-pay

## 2020-10-06 ENCOUNTER — Encounter: Payer: Self-pay | Admitting: Physician Assistant

## 2020-10-06 VITALS — BP 138/64 | HR 55 | Ht 68.5 in | Wt 226.0 lb

## 2020-10-06 DIAGNOSIS — I5032 Chronic diastolic (congestive) heart failure: Secondary | ICD-10-CM

## 2020-10-06 DIAGNOSIS — I2510 Atherosclerotic heart disease of native coronary artery without angina pectoris: Secondary | ICD-10-CM

## 2020-10-06 DIAGNOSIS — E7800 Pure hypercholesterolemia, unspecified: Secondary | ICD-10-CM

## 2020-10-06 DIAGNOSIS — I480 Paroxysmal atrial fibrillation: Secondary | ICD-10-CM

## 2020-10-06 DIAGNOSIS — I10 Essential (primary) hypertension: Secondary | ICD-10-CM | POA: Diagnosis not present

## 2020-10-06 NOTE — Progress Notes (Signed)
Cardiology Office Note:    Date:  10/06/2020   ID:  [name], DOB [birth year], MRN [id number]  PCP:  Albert Huddle, MD  [facility] Cardiologist:  Sinclair Grooms, MD  [place] Electrophysiologist:  None   Chief Complaint: CAD follow up   History of Present Illness:    Albert Short is a 80 y.o. male with a hx of  CAD s/p CABG 4 in 2012, hypertension, diabetes mellitus, PAF on coumadin, sinus bradycardia, chronic diastolic CHF, CKD and obesity seen for follow up.   Echo 11/2014 revealed mild LVH, EF 45-50%, akinesis of the mid-apicalanteroseptal and apical myocardium. Grade 1 DD. Trace MR.   Stress test 12/2014: Intermediate study. There is a large scar in all of the apical segments and inferior wall. No ischemia.  He was doing well when last seen by Dr. Tamala Julian 05/2019.  Patient is here for follow-up.  No regular exercise but does post mowing lawn and weed eating without any issue.  He denies chest pain, shortness of breath, orthopnea, PND, syncope, lower extremity edema or melena.  Past Medical History:  Diagnosis Date   A-fib [facility])    resolved 2006   CAD (coronary artery disease)    Diverticulosis    DM type 2 (diabetes mellitus, type 2) ([facility])    GERD (gastroesophageal reflux disease)    Hypercholesterolemia    MI (myocardial infarction) ([place]) 04/1986   preserved OV systolic function with apical akinesis    Obesity     Past Surgical History:  Procedure Laterality Date   CARDIAC CATHETERIZATION  19-Oct-1939   Dr Daneen Schick   CORONARY ARTERY BYPASS GRAFT     Coronary artery bypass grafting x4  10/27/10   [place] [facility]    Current Medications: Current Meds  Medication Sig   amLODipine (NORVASC) 10 MG tablet TAKE 1 TABLET EVERY DAY (PLEASE KEEP UPCOMING APPOINTMENT IN AUGUST 2022 BEFORE ANYMORE REFILLS. THANK YOU)   Cholecalciferol (VITAMIN D3) 1000 UNITS CAPS Take 1 capsule (1,000 Units total) by mouth 2 (two) times daily.   hydrochlorothiazide 25 MG  tablet Take 12.5 mg by mouth 2 (two) times daily.    losartan (COZAAR) 100 MG tablet Take 100 mg by mouth daily.    metFORMIN (GLUCOPHAGE) 500 MG tablet Take 500 mg by mouth daily with breakfast.    metoprolol tartrate (LOPRESSOR) 25 MG tablet Take 1 tablet (25 mg total) by mouth 2 (two) times daily. Please make appt with Dr. Tamala Julian for October before anymore refills. 1st attempt   nitroGLYCERIN (NITROSTAT) 0.4 MG SL tablet Place 0.4 mg under the tongue every 5 (five) minutes as needed for chest pain (no more than 3).    simvastatin (ZOCOR) 40 MG tablet Take 1 tablet by mouth daily.   traMADol (ULTRAM) 50 MG tablet Take 50 mg by mouth as directed. Patient takes '50mg'$  twice daily, then as needed can take addt'l tablet   warfarin (COUMADIN) 5 MG tablet Take 5 mg by mouth daily at 6 PM. Takes 7.'5mg'$  on sun and wed  Takes '5mg'$  all other days     Allergies:   Patient has no known allergies.   Social History   Socioeconomic History   Marital status: Married    Spouse name: Not on file   Number of children: 2   Years of education: Not on file   Highest education level: Not on file  Occupational History   Occupation: truck driver, retired  Tobacco Use   Smoking status:  Former    Types: Cigarettes    Quit date: 04/06/1986    Years since quitting: 34.5   Smokeless tobacco: Never  Vaping Use   Vaping Use: Never used  Substance and Sexual Activity   Alcohol use: No    Comment: d/c'ed with MI in 1988   Drug use: No   Sexual activity: Not on file  Other Topics Concern   Not on file  Social History Narrative   Not on file   Social Determinants of Health   Financial Resource Strain: Not on file  Food Insecurity: Not on file  Transportation Needs: Not on file  Physical Activity: Not on file  Stress: Not on file  Social Connections: Not on file     Family History: The patient's family history includes Alcoholism in his father; Diabetes in his mother; Heart disease in his brother and  paternal grandfather.    ROS:   Please see the history of present illness.    All other systems reviewed and are negative.   EKGs/Labs/Other Studies Reviewed:    The following studies were reviewed today: Stress test 12/2014 Nuclear stress EF: 48%. There was no ST segment deviation noted during stress. Defect 1: There is a large defect of severe severity. Findings consistent with prior myocardial infarction. This is an intermediate risk study. The left ventricular ejection fraction is moderately decreased (30-44%).   There is a large scar in all of the apical segments and inferior wall. No ischemia.  Echo 11/2014 Study Conclusions   - Left ventricle: The cavity size was normal. Wall thickness was    increased in a pattern of mild LVH. Systolic function was mildly    reduced. The estimated ejection fraction was in the range of 45%    to 50%. There is akinesis of the mid-apicalanteroseptal and    apical myocardium. Doppler parameters are consistent with    abnormal left ventricular relaxation (grade 1 diastolic    dysfunction).   Impressions:   - Technically difficult; definity used; distal septal and apical    akinesis with overall mildly reduced LV systolic function; grade    1 diastolic dysfunction; trace MR.   EKG:  EKG is ordered today.  The ekg ordered today demonstrates sinus bradycardia at rate of 55 bpm, old anterior MI  Recent Labs: No results found for requested labs within last 8760 hours.  Recent Lipid Panel    Component Value Date/Time   CHOL 104 05/05/2019 0848   TRIG 121 05/05/2019 0848   HDL 35 (L) 05/05/2019 0848   CHOLHDL 3.0 05/05/2019 0848   LDLCALC 47 05/05/2019 0848     Risk Assessment/Calculations:    CHA2DS2-VASc Score = 6  his indicates a 9.7% annual risk of stroke. The patient's score is based upon: CHF History: Yes HTN History: Yes Diabetes History: Yes Stroke History: No Vascular Disease History: Yes Age Score: 2 Gender Score: 0    Physical Exam:    VS:  BP 138/64   Pulse (!) 55   Ht 5' 8.5" (1.74 m)   Wt 226 lb (102.5 kg)   SpO2 97%   BMI 33.86 kg/m     Wt Readings from Last 3 Encounters:  10/06/20 226 lb (102.5 kg)  05/05/19 241 lb (109.3 kg)  12/26/17 244 lb 6.4 oz (110.9 kg)     GEN: Well nourished, well developed in no acute distress HEENT: Normal NECK: No JVD; No carotid bruits LYMPHATICS: No lymphadenopathy CARDIAC: RRR, no murmurs, rubs, gallops RESPIRATORY:  Clear to auscultation without rales, wheezing or rhonchi  ABDOMEN: Soft, non-tender, non-distended MUSCULOSKELETAL:  No edema; No deformity  SKIN: Warm and dry NEUROLOGIC:  Alert and oriented x 3 PSYCHIATRIC:  Normal affect   ASSESSMENT AND PLAN:    CAD s/p CABG No anginal symptoms.  Not on aspirin due to need of anticoagulation.  Continue beta-blocker and statin.  2.  PAF -Maintaining sinus rhythm at slow rate.  No bleeding issue.  Coumadin managed by PCP.  3.  Hypertension -Blood pressure stable and controlled on current medications.  No change.  4.  Hyperlipidemia Most recent LDL 60.  Continue statin.  Medication Adjustments/Labs and Tests Ordered: Current medicines are reviewed at length with the patient today.  Concerns regarding medicines are outlined above.  No orders of the defined types were placed in this encounter.  No orders of the defined types were placed in this encounter.   Patient Instructions  Medication Instructions:  Your physician recommends that you continue on your current medications as directed. Please refer to the Current Medication list given to you today.  *If you need a refill on your cardiac medications before your next appointment, please call your pharmacy*   Lab Work: none If you have labs (blood work) drawn today and your tests are completely normal, you will receive your results only by: [place] (if you have MyChart) OR A paper copy in the mail If you have any lab test that  is abnormal or we need to change your treatment, we will call you to review the results.   Testing/Procedures: none   Follow-Up: At [facility], you and your health needs are our priority.  As part of our continuing mission to provide you with exceptional heart care, we have created designated Provider Care Teams.  These Care Teams include your primary Cardiologist (physician) and Advanced Practice Providers (APPs -  Physician Assistants and Nurse Practitioners) who all work together to provide you with the care you need, when you need it.  We recommend signing up for the patient portal called "MyChart".  Sign up information is provided on this After Visit Summary.  MyChart is used to connect with patients for Virtual Visits (Telemedicine).  Patients are able to view lab/test results, encounter notes, upcoming appointments, etc.  Non-urgent messages can be sent to your provider as well.   To learn more about what you can do with MyChart, go to [email or web address].    Your next appointment:   12 month(s)  The format for your next appointment:   In Person  Provider:   You may see Sinclair Grooms, MD or one of the following Advanced Practice Providers on your designated Care Team:   Cecilie Kicks, NP    Other Instructions    Signed, Leanor Kail, [place]  10/06/2020 10:35 AM    [place]

## 2020-10-06 NOTE — Patient Instructions (Signed)
Medication Instructions:  Your physician recommends that you continue on your current medications as directed. Please refer to the Current Medication list given to you today.  *If you need a refill on your cardiac medications before your next appointment, please call your pharmacy*   Lab Work: none If you have labs (blood work) drawn today and your tests are completely normal, you will receive your results only by: [place] (if you have MyChart) OR A paper copy in the mail If you have any lab test that is abnormal or we need to change your treatment, we will call you to review the results.   Testing/Procedures: none   Follow-Up: At [facility], you and your health needs are our priority.  As part of our continuing mission to provide you with exceptional heart care, we have created designated Provider Care Teams.  These Care Teams include your primary Cardiologist (physician) and Advanced Practice Providers (APPs -  Physician Assistants and Nurse Practitioners) who all work together to provide you with the care you need, when you need it.  We recommend signing up for the patient portal called "MyChart".  Sign up information is provided on this After Visit Summary.  MyChart is used to connect with patients for Virtual Visits (Telemedicine).  Patients are able to view lab/test results, encounter notes, upcoming appointments, etc.  Non-urgent messages can be sent to your provider as well.   To learn more about what you can do with MyChart, go to [email or web address].    Your next appointment:   12 month(s)  The format for your next appointment:   In Person  Provider:   You may see Sinclair Grooms, MD or one of the following Advanced Practice Providers on your designated Care Team:   Cecilie Kicks, NP    Other Instructions

## 2020-11-11 DIAGNOSIS — I480 Paroxysmal atrial fibrillation: Secondary | ICD-10-CM | POA: Diagnosis not present

## 2020-11-11 DIAGNOSIS — D6869 Other thrombophilia: Secondary | ICD-10-CM | POA: Diagnosis not present

## 2020-11-11 DIAGNOSIS — N2889 Other specified disorders of kidney and ureter: Secondary | ICD-10-CM | POA: Diagnosis not present

## 2020-11-11 DIAGNOSIS — N200 Calculus of kidney: Secondary | ICD-10-CM | POA: Diagnosis not present

## 2020-11-11 DIAGNOSIS — R319 Hematuria, unspecified: Secondary | ICD-10-CM | POA: Diagnosis not present

## 2020-11-12 ENCOUNTER — Other Ambulatory Visit: Payer: Self-pay | Admitting: Internal Medicine

## 2020-11-12 DIAGNOSIS — N289 Disorder of kidney and ureter, unspecified: Secondary | ICD-10-CM

## 2020-11-12 DIAGNOSIS — R319 Hematuria, unspecified: Secondary | ICD-10-CM

## 2020-11-15 ENCOUNTER — Ambulatory Visit
Admission: RE | Admit: 2020-11-15 | Discharge: 2020-11-15 | Disposition: A | Discharge status: Home / Self Care | Payer: Medicare HMO | Source: Ambulatory Visit | Attending: Internal Medicine | Admitting: Internal Medicine

## 2020-11-15 DIAGNOSIS — N3289 Other specified disorders of bladder: Secondary | ICD-10-CM | POA: Diagnosis not present

## 2020-11-15 DIAGNOSIS — R319 Hematuria, unspecified: Secondary | ICD-10-CM | POA: Diagnosis not present

## 2020-11-15 DIAGNOSIS — N289 Disorder of kidney and ureter, unspecified: Secondary | ICD-10-CM

## 2020-11-15 DIAGNOSIS — N189 Chronic kidney disease, unspecified: Secondary | ICD-10-CM | POA: Diagnosis not present

## 2020-12-30 DIAGNOSIS — I10 Essential (primary) hypertension: Secondary | ICD-10-CM | POA: Diagnosis not present

## 2021-01-20 ENCOUNTER — Other Ambulatory Visit: Payer: Self-pay | Admitting: Interventional Cardiology

## 2021-02-10 DIAGNOSIS — M4800 Spinal stenosis, site unspecified: Secondary | ICD-10-CM | POA: Diagnosis not present

## 2021-02-10 DIAGNOSIS — I10 Essential (primary) hypertension: Secondary | ICD-10-CM | POA: Diagnosis not present

## 2021-03-16 DIAGNOSIS — M4800 Spinal stenosis, site unspecified: Secondary | ICD-10-CM | POA: Diagnosis not present

## 2021-03-16 DIAGNOSIS — B349 Viral infection, unspecified: Secondary | ICD-10-CM | POA: Diagnosis not present

## 2021-03-16 DIAGNOSIS — I10 Essential (primary) hypertension: Secondary | ICD-10-CM | POA: Diagnosis not present

## 2021-04-13 DIAGNOSIS — I10 Essential (primary) hypertension: Secondary | ICD-10-CM | POA: Diagnosis not present

## 2021-04-13 DIAGNOSIS — M4800 Spinal stenosis, site unspecified: Secondary | ICD-10-CM | POA: Diagnosis not present

## 2021-05-12 ENCOUNTER — Other Ambulatory Visit: Payer: Self-pay | Admitting: Internal Medicine

## 2021-05-12 DIAGNOSIS — M4800 Spinal stenosis, site unspecified: Secondary | ICD-10-CM | POA: Diagnosis not present

## 2021-05-12 DIAGNOSIS — M48061 Spinal stenosis, lumbar region without neurogenic claudication: Secondary | ICD-10-CM

## 2021-05-12 DIAGNOSIS — I10 Essential (primary) hypertension: Secondary | ICD-10-CM | POA: Diagnosis not present

## 2021-05-12 DIAGNOSIS — I739 Peripheral vascular disease, unspecified: Secondary | ICD-10-CM | POA: Diagnosis not present

## 2021-06-02 ENCOUNTER — Other Ambulatory Visit: Payer: Medicare HMO

## 2021-07-14 ENCOUNTER — Ambulatory Visit
Admission: RE | Admit: 2021-07-14 | Discharge: 2021-07-14 | Disposition: A | Discharge status: Home / Self Care | Payer: Medicare HMO | Source: Ambulatory Visit | Attending: Internal Medicine | Admitting: Internal Medicine

## 2021-07-14 ENCOUNTER — Other Ambulatory Visit: Payer: Self-pay | Admitting: Internal Medicine

## 2021-07-14 DIAGNOSIS — R350 Frequency of micturition: Secondary | ICD-10-CM | POA: Diagnosis not present

## 2021-07-14 DIAGNOSIS — R8290 Unspecified abnormal findings in urine: Secondary | ICD-10-CM | POA: Diagnosis not present

## 2021-07-14 DIAGNOSIS — I2510 Atherosclerotic heart disease of native coronary artery without angina pectoris: Secondary | ICD-10-CM | POA: Diagnosis not present

## 2021-07-14 DIAGNOSIS — M4800 Spinal stenosis, site unspecified: Secondary | ICD-10-CM | POA: Diagnosis not present

## 2021-07-14 DIAGNOSIS — E782 Mixed hyperlipidemia: Secondary | ICD-10-CM | POA: Diagnosis not present

## 2021-07-14 DIAGNOSIS — I10 Essential (primary) hypertension: Secondary | ICD-10-CM | POA: Diagnosis not present

## 2021-07-14 DIAGNOSIS — R634 Abnormal weight loss: Secondary | ICD-10-CM

## 2021-07-14 DIAGNOSIS — Z1331 Encounter for screening for depression: Secondary | ICD-10-CM | POA: Diagnosis not present

## 2021-07-14 DIAGNOSIS — E1140 Type 2 diabetes mellitus with diabetic neuropathy, unspecified: Secondary | ICD-10-CM | POA: Diagnosis not present

## 2021-07-14 DIAGNOSIS — N401 Enlarged prostate with lower urinary tract symptoms: Secondary | ICD-10-CM | POA: Diagnosis not present

## 2021-07-14 DIAGNOSIS — E559 Vitamin D deficiency, unspecified: Secondary | ICD-10-CM | POA: Diagnosis not present

## 2021-07-14 DIAGNOSIS — R531 Weakness: Secondary | ICD-10-CM | POA: Diagnosis not present

## 2021-07-14 DIAGNOSIS — I480 Paroxysmal atrial fibrillation: Secondary | ICD-10-CM | POA: Diagnosis not present

## 2021-07-14 DIAGNOSIS — Z0000 Encounter for general adult medical examination without abnormal findings: Secondary | ICD-10-CM | POA: Diagnosis not present

## 2021-07-14 DIAGNOSIS — D6869 Other thrombophilia: Secondary | ICD-10-CM | POA: Diagnosis not present

## 2021-07-29 DIAGNOSIS — N390 Urinary tract infection, site not specified: Secondary | ICD-10-CM | POA: Diagnosis not present

## 2021-08-12 DIAGNOSIS — Z7901 Long term (current) use of anticoagulants: Secondary | ICD-10-CM | POA: Diagnosis not present

## 2021-08-29 ENCOUNTER — Other Ambulatory Visit: Payer: Self-pay

## 2021-08-29 ENCOUNTER — Emergency Department (HOSPITAL_COMMUNITY)
Admission: EM | Admit: 2021-08-29 | Discharge: 2021-08-30 | Disposition: A | Discharge status: Home / Self Care | Payer: Medicare HMO | Attending: Emergency Medicine | Admitting: Emergency Medicine

## 2021-08-29 DIAGNOSIS — R5383 Other fatigue: Secondary | ICD-10-CM | POA: Diagnosis not present

## 2021-08-29 DIAGNOSIS — I499 Cardiac arrhythmia, unspecified: Secondary | ICD-10-CM | POA: Diagnosis not present

## 2021-08-29 DIAGNOSIS — R531 Weakness: Secondary | ICD-10-CM | POA: Diagnosis not present

## 2021-08-29 DIAGNOSIS — E119 Type 2 diabetes mellitus without complications: Secondary | ICD-10-CM | POA: Diagnosis not present

## 2021-08-29 DIAGNOSIS — I2510 Atherosclerotic heart disease of native coronary artery without angina pectoris: Secondary | ICD-10-CM | POA: Insufficient documentation

## 2021-08-29 DIAGNOSIS — Z7901 Long term (current) use of anticoagulants: Secondary | ICD-10-CM | POA: Diagnosis not present

## 2021-08-29 DIAGNOSIS — Z7984 Long term (current) use of oral hypoglycemic drugs: Secondary | ICD-10-CM | POA: Diagnosis not present

## 2021-08-29 DIAGNOSIS — E161 Other hypoglycemia: Secondary | ICD-10-CM | POA: Diagnosis not present

## 2021-08-29 DIAGNOSIS — T68XXXA Hypothermia, initial encounter: Secondary | ICD-10-CM | POA: Diagnosis not present

## 2021-08-29 DIAGNOSIS — I4891 Unspecified atrial fibrillation: Secondary | ICD-10-CM | POA: Diagnosis not present

## 2021-08-29 DIAGNOSIS — E162 Hypoglycemia, unspecified: Secondary | ICD-10-CM | POA: Diagnosis not present

## 2021-08-29 DIAGNOSIS — R4182 Altered mental status, unspecified: Secondary | ICD-10-CM | POA: Diagnosis not present

## 2021-08-29 LAB — CBC WITH DIFFERENTIAL/PLATELET
Abs Immature Granulocytes: 0.07 10*3/uL (ref 0.00–0.07)
Basophils Absolute: 0.1 10*3/uL (ref 0.0–0.1)
Basophils Relative: 1 %
Eosinophils Absolute: 0 10*3/uL (ref 0.0–0.5)
Eosinophils Relative: 0 %
HCT: 34.5 % — ABNORMAL LOW (ref 39.0–52.0)
Hemoglobin: 11 g/dL — ABNORMAL LOW (ref 13.0–17.0)
Immature Granulocytes: 1 %
Lymphocytes Relative: 9 %
Lymphs Abs: 0.9 10*3/uL (ref 0.7–4.0)
MCH: 27.5 pg (ref 26.0–34.0)
MCHC: 31.9 g/dL (ref 30.0–36.0)
MCV: 86.3 fL (ref 80.0–100.0)
Monocytes Absolute: 1.3 10*3/uL — ABNORMAL HIGH (ref 0.1–1.0)
Monocytes Relative: 13 %
Neutro Abs: 7.8 10*3/uL — ABNORMAL HIGH (ref 1.7–7.7)
Neutrophils Relative %: 76 %
Platelets: 230 10*3/uL (ref 150–400)
RBC: 4 MIL/uL — ABNORMAL LOW (ref 4.22–5.81)
RDW: 15.9 % — ABNORMAL HIGH (ref 11.5–15.5)
WBC: 10.2 10*3/uL (ref 4.0–10.5)
nRBC: 0 % (ref 0.0–0.2)

## 2021-08-29 LAB — CBG MONITORING, ED: Glucose-Capillary: 140 mg/dL — ABNORMAL HIGH (ref 70–99)

## 2021-08-29 LAB — COMPREHENSIVE METABOLIC PANEL
ALT: 22 U/L (ref 0–44)
AST: 22 U/L (ref 15–41)
Albumin: 2.8 g/dL — ABNORMAL LOW (ref 3.5–5.0)
Alkaline Phosphatase: 74 U/L (ref 38–126)
Anion gap: 13 (ref 5–15)
BUN: 25 mg/dL — ABNORMAL HIGH (ref 8–23)
CO2: 16 mmol/L — ABNORMAL LOW (ref 22–32)
Calcium: 8 mg/dL — ABNORMAL LOW (ref 8.9–10.3)
Chloride: 103 mmol/L (ref 98–111)
Creatinine, Ser: 1.9 mg/dL — ABNORMAL HIGH (ref 0.61–1.24)
GFR, Estimated: 35 mL/min — ABNORMAL LOW (ref >60)
Glucose, Bld: 142 mg/dL — ABNORMAL HIGH (ref 70–99)
Potassium: 4 mmol/L (ref 3.5–5.1)
Sodium: 132 mmol/L — ABNORMAL LOW (ref 135–145)
Total Bilirubin: 1.1 mg/dL (ref 0.3–1.2)
Total Protein: 5.9 g/dL — ABNORMAL LOW (ref 6.5–8.1)

## 2021-08-29 LAB — PROTIME-INR
INR: 2.2 — ABNORMAL HIGH (ref 0.8–1.2)
Prothrombin Time: 24.4 seconds — ABNORMAL HIGH (ref 11.4–15.2)

## 2021-08-29 LAB — LIPASE, BLOOD: Lipase: 23 U/L (ref 11–51)

## 2021-08-29 LAB — LACTIC ACID, PLASMA: Lactic Acid, Venous: 1.9 mmol/L (ref 0.5–1.9)

## 2021-08-29 NOTE — ED Notes (Signed)
Pt felt as though he didn't need to pee but stated he would tr. Attempt was unsuccessful. Pt educated to use call light when he feels he needs to urinate in order to obtain a sample

## 2021-08-30 ENCOUNTER — Emergency Department (HOSPITAL_COMMUNITY): Payer: Medicare HMO

## 2021-08-30 DIAGNOSIS — R4182 Altered mental status, unspecified: Secondary | ICD-10-CM | POA: Diagnosis not present

## 2021-08-30 LAB — URINALYSIS, ROUTINE W REFLEX MICROSCOPIC
Bilirubin Urine: NEGATIVE
Glucose, UA: NEGATIVE mg/dL
Ketones, ur: NEGATIVE mg/dL
Nitrite: NEGATIVE
Protein, ur: 100 mg/dL — AB
RBC / HPF: >50 RBC/hpf — ABNORMAL HIGH (ref 0–5)
Specific Gravity, Urine: 1.017 (ref 1.005–1.030)
pH: 6 (ref 5.0–8.0)

## 2021-08-30 MED ORDER — SODIUM CHLORIDE 0.9 % IV BOLUS
500.0000 mL | Freq: Once | INTRAVENOUS | Status: AC
  Administered 2021-08-30: 500 mL via INTRAVENOUS

## 2021-08-31 DIAGNOSIS — I480 Paroxysmal atrial fibrillation: Secondary | ICD-10-CM | POA: Diagnosis not present

## 2021-08-31 DIAGNOSIS — D6869 Other thrombophilia: Secondary | ICD-10-CM | POA: Diagnosis not present

## 2021-08-31 DIAGNOSIS — N401 Enlarged prostate with lower urinary tract symptoms: Secondary | ICD-10-CM | POA: Diagnosis not present

## 2021-08-31 DIAGNOSIS — M4800 Spinal stenosis, site unspecified: Secondary | ICD-10-CM | POA: Diagnosis not present

## 2021-08-31 DIAGNOSIS — E1140 Type 2 diabetes mellitus with diabetic neuropathy, unspecified: Secondary | ICD-10-CM | POA: Diagnosis not present

## 2021-08-31 DIAGNOSIS — E559 Vitamin D deficiency, unspecified: Secondary | ICD-10-CM | POA: Diagnosis not present

## 2021-08-31 DIAGNOSIS — I2510 Atherosclerotic heart disease of native coronary artery without angina pectoris: Secondary | ICD-10-CM | POA: Diagnosis not present

## 2021-08-31 DIAGNOSIS — E782 Mixed hyperlipidemia: Secondary | ICD-10-CM | POA: Diagnosis not present

## 2021-08-31 DIAGNOSIS — I10 Essential (primary) hypertension: Secondary | ICD-10-CM | POA: Diagnosis not present

## 2021-08-31 LAB — URINE CULTURE: Culture: NO GROWTH

## 2021-09-09 DIAGNOSIS — Z7901 Long term (current) use of anticoagulants: Secondary | ICD-10-CM | POA: Diagnosis not present

## 2021-09-28 DIAGNOSIS — D6869 Other thrombophilia: Secondary | ICD-10-CM | POA: Diagnosis not present

## 2021-09-28 DIAGNOSIS — E1140 Type 2 diabetes mellitus with diabetic neuropathy, unspecified: Secondary | ICD-10-CM | POA: Diagnosis not present

## 2021-09-28 DIAGNOSIS — I2510 Atherosclerotic heart disease of native coronary artery without angina pectoris: Secondary | ICD-10-CM | POA: Diagnosis not present

## 2021-09-28 DIAGNOSIS — N401 Enlarged prostate with lower urinary tract symptoms: Secondary | ICD-10-CM | POA: Diagnosis not present

## 2021-09-28 DIAGNOSIS — E559 Vitamin D deficiency, unspecified: Secondary | ICD-10-CM | POA: Diagnosis not present

## 2021-09-28 DIAGNOSIS — I480 Paroxysmal atrial fibrillation: Secondary | ICD-10-CM | POA: Diagnosis not present

## 2021-09-28 DIAGNOSIS — M4800 Spinal stenosis, site unspecified: Secondary | ICD-10-CM | POA: Diagnosis not present

## 2021-09-28 DIAGNOSIS — R531 Weakness: Secondary | ICD-10-CM | POA: Diagnosis not present

## 2021-09-28 DIAGNOSIS — I10 Essential (primary) hypertension: Secondary | ICD-10-CM | POA: Diagnosis not present

## 2021-10-06 DIAGNOSIS — Z7901 Long term (current) use of anticoagulants: Secondary | ICD-10-CM | POA: Diagnosis not present

## 2021-11-03 DIAGNOSIS — Z7901 Long term (current) use of anticoagulants: Secondary | ICD-10-CM | POA: Diagnosis not present

## 2021-12-01 DIAGNOSIS — Z7901 Long term (current) use of anticoagulants: Secondary | ICD-10-CM | POA: Diagnosis not present

## 2021-12-06 NOTE — Progress Notes (Unsigned)
Cardiology Office Note:    Date:  12/06/2021   ID:  Albert Short, Albert Short Apr 09, 1940, MRN [id number]  PCP:  Josetta Huddle, MD  Cardiologist:  Sinclair Grooms, MD   Referring MD: Josetta Huddle, MD   No chief complaint on file.   History of Present Illness:    Albert Short is a 81 y.o. male with a hx of CAD s/p CABG 4 in 2012, hypertension, diabetes mellitus, PAF on coumadin, sinus bradycardia, chronic diastolic CHF 43-15%, CKD and obesity    ***  Past Medical History:  Diagnosis Date   A-fib ([place])    resolved 2006   CAD (coronary artery disease)    Diverticulosis    DM type 2 (diabetes mellitus, type 2) (HCC)    GERD (gastroesophageal reflux disease)    Hypercholesterolemia    MI (myocardial infarction) ([place]) 04/1986   preserved OV systolic function with apical akinesis    Obesity     Past Surgical History:  Procedure Laterality Date   CARDIAC CATHETERIZATION  January 22, 1940   Dr Daneen Schick   CORONARY ARTERY BYPASS GRAFT     Coronary artery bypass grafting x4  10/27/10   [facility] Gum    Current Medications: No outpatient medications have been marked as taking for the 12/07/21 encounter (Appointment) with Belva Crome, MD.     Allergies:   Patient has no known allergies.   Social History   Socioeconomic History   Marital status: Married    Spouse name: Not on file   Number of children: 2   Years of education: Not on file   Highest education level: Not on file  Occupational History   Occupation: truck driver, retired  Tobacco Use   Smoking status: Former    Types: Cigarettes    Quit date: 04/06/1986    Years since quitting: 35.6   Smokeless tobacco: Never  Vaping Use   Vaping Use: Never used  Substance and Sexual Activity   Alcohol use: No    Comment: d/c'ed with MI in 1988   Drug use: No   Sexual activity: Not on file  Other Topics Concern   Not on file  Social History Narrative   Not on file   Social Determinants of Health   Financial  Resource Strain: Not on file  Food Insecurity: Not on file  Transportation Needs: Not on file  Physical Activity: Not on file  Stress: Not on file  Social Connections: Not on file     Family History: The patient's family history includes Alcoholism in his father; Diabetes in his mother; Heart disease in his brother and paternal grandfather.  ROS:   Please see the history of present illness.    *** All other systems reviewed and are negative.  EKGs/Labs/Other Studies Reviewed:    The following studies were reviewed today: ***  EKG:  EKG ***  Recent Labs: 08/29/2021: ALT 22; BUN 25; Creatinine, Ser 1.90; Hemoglobin 11.0; Platelets 230; Potassium 4.0; Sodium 132  Recent Lipid Panel    Component Value Date/Time   CHOL 104 05/05/2019 0848   TRIG 121 05/05/2019 0848   HDL 35 (L) 05/05/2019 0848   CHOLHDL 3.0 05/05/2019 0848   LDLCALC 47 05/05/2019 0848    Physical Exam:    VS:  There were no vitals taken for this visit.    Wt Readings from Last 3 Encounters:  08/29/21 210 lb (95.3 kg)  10/06/20 226 lb (102.5 kg)  05/05/19 241 lb (109.3 kg)  GEN: ***. No acute distress HEENT: Normal NECK: No JVD. LYMPHATICS: No lymphadenopathy CARDIAC: *** murmur. RRR *** gallop, or edema. VASCULAR: *** Normal Pulses. No bruits. RESPIRATORY:  Clear to auscultation without rales, wheezing or rhonchi  ABDOMEN: Soft, non-tender, non-distended, No pulsatile mass, MUSCULOSKELETAL: No deformity  SKIN: Warm and dry NEUROLOGIC:  Alert and oriented x 3 PSYCHIATRIC:  Normal affect   ASSESSMENT:    1. Coronary artery disease involving native coronary artery of native heart without angina pectoris   2. Chronic diastolic heart failure (HCC)   3. Paroxysmal atrial fibrillation ([place])   4. Essential hypertension   5. Hypercholesterolemia    PLAN:    In order of problems listed above:  ***   Medication Adjustments/Labs and Tests Ordered: Current medicines are reviewed at length with  the patient today.  Concerns regarding medicines are outlined above.  No orders of the defined types were placed in this encounter.  No orders of the defined types were placed in this encounter.   There are no Patient Instructions on file for this visit.   Signed, Sinclair Grooms, MD  12/06/2021 7:43 PM    [place]

## 2021-12-07 ENCOUNTER — Ambulatory Visit: Payer: Medicare HMO | Attending: Interventional Cardiology | Admitting: Interventional Cardiology

## 2021-12-07 ENCOUNTER — Telehealth: Payer: Self-pay | Admitting: Interventional Cardiology

## 2021-12-07 ENCOUNTER — Encounter: Payer: Self-pay | Admitting: Interventional Cardiology

## 2021-12-07 VITALS — BP 110/72 | HR 57 | Ht 69.0 in | Wt 190.8 lb

## 2021-12-07 DIAGNOSIS — I10 Essential (primary) hypertension: Secondary | ICD-10-CM

## 2021-12-07 DIAGNOSIS — E7800 Pure hypercholesterolemia, unspecified: Secondary | ICD-10-CM

## 2021-12-07 DIAGNOSIS — I5032 Chronic diastolic (congestive) heart failure: Secondary | ICD-10-CM

## 2021-12-07 DIAGNOSIS — I480 Paroxysmal atrial fibrillation: Secondary | ICD-10-CM

## 2021-12-07 DIAGNOSIS — I2510 Atherosclerotic heart disease of native coronary artery without angina pectoris: Secondary | ICD-10-CM

## 2021-12-07 MED ORDER — HYDROCHLOROTHIAZIDE 25 MG PO TABS: 12.5000 mg | ORAL_TABLET | Freq: Every day | ORAL | Status: AC

## 2021-12-07 NOTE — Patient Instructions (Addendum)
Medication Instructions:  Your physician recommends that you continue on your current medications as directed. Please refer to the Current Medication list given to you today.  1) DECREASE Hydrochlorothiazide to 12.'5mg'$  daily  **Please review your medications and call our office to verify what medications you are taking. Call us at [phone]. Dr. Tamala Julian would like to adjust your medications due to your low blood pressure, but we need to know what and how much medication you are currently taking.**  *If you need a refill on your cardiac medications before your next appointment, please call your pharmacy*  Lab Work: NONE  Testing/Procedures: NONE  Follow-Up: At [facility], you and your health needs are our priority.  As part of our continuing mission to provide you with exceptional heart care, we have created designated Provider Care Teams.  These Care Teams include your primary Cardiologist (physician) and Advanced Practice Providers (APPs -  Physician Assistants and Nurse Practitioners) who all work together to provide you with the care you need, when you need it.  Your next appointment:   9-12 month(s)  The format for your next appointment:   In Person  Provider:   Sinclair Grooms, MD     Important Information About Sugar

## 2021-12-07 NOTE — Telephone Encounter (Signed)
Returned call to patient and discussed current medications he is taking.  Medication list updated, reviewed with Dr. Tamala Julian who recommends based on BP at today's OV patient decrease HCTZ to 12.'5mg'$  QD.  Patient verbalized understanding of the above.

## 2021-12-07 NOTE — Telephone Encounter (Signed)
Patient is requesting to speak with Dr. Thompson Caul nurse. He states he was advised to call back to clarify medications.

## 2021-12-10 ENCOUNTER — Other Ambulatory Visit: Payer: Self-pay | Admitting: Interventional Cardiology

## 2021-12-12 ENCOUNTER — Other Ambulatory Visit: Payer: Self-pay | Admitting: *Deleted

## 2021-12-28 DIAGNOSIS — E782 Mixed hyperlipidemia: Secondary | ICD-10-CM | POA: Diagnosis not present

## 2021-12-28 DIAGNOSIS — E1140 Type 2 diabetes mellitus with diabetic neuropathy, unspecified: Secondary | ICD-10-CM | POA: Diagnosis not present

## 2021-12-28 DIAGNOSIS — I10 Essential (primary) hypertension: Secondary | ICD-10-CM | POA: Diagnosis not present

## 2021-12-28 DIAGNOSIS — I2510 Atherosclerotic heart disease of native coronary artery without angina pectoris: Secondary | ICD-10-CM | POA: Diagnosis not present

## 2022-01-03 DIAGNOSIS — R5383 Other fatigue: Secondary | ICD-10-CM | POA: Diagnosis not present

## 2022-01-03 DIAGNOSIS — E1140 Type 2 diabetes mellitus with diabetic neuropathy, unspecified: Secondary | ICD-10-CM | POA: Diagnosis not present

## 2022-01-03 DIAGNOSIS — I10 Essential (primary) hypertension: Secondary | ICD-10-CM | POA: Diagnosis not present

## 2022-01-03 DIAGNOSIS — Z7901 Long term (current) use of anticoagulants: Secondary | ICD-10-CM | POA: Diagnosis not present

## 2022-01-03 DIAGNOSIS — Z23 Encounter for immunization: Secondary | ICD-10-CM | POA: Diagnosis not present

## 2022-01-03 DIAGNOSIS — R319 Hematuria, unspecified: Secondary | ICD-10-CM | POA: Diagnosis not present

## 2022-01-03 DIAGNOSIS — E782 Mixed hyperlipidemia: Secondary | ICD-10-CM | POA: Diagnosis not present

## 2022-01-05 DIAGNOSIS — R319 Hematuria, unspecified: Secondary | ICD-10-CM | POA: Diagnosis not present

## 2022-01-16 ENCOUNTER — Emergency Department (HOSPITAL_COMMUNITY): Payer: Medicare HMO

## 2022-01-16 ENCOUNTER — Observation Stay (HOSPITAL_COMMUNITY): Payer: Medicare HMO

## 2022-01-16 ENCOUNTER — Encounter (HOSPITAL_COMMUNITY): Payer: Self-pay | Admitting: Radiology

## 2022-01-16 ENCOUNTER — Inpatient Hospital Stay (HOSPITAL_COMMUNITY)
Admission: EM | Admit: 2022-01-16 | Discharge: 2022-02-03 | DRG: 682 | Disposition: E | Payer: Medicare HMO | Attending: Family Medicine | Admitting: Family Medicine

## 2022-01-16 DIAGNOSIS — E669 Obesity, unspecified: Secondary | ICD-10-CM | POA: Diagnosis present

## 2022-01-16 DIAGNOSIS — N179 Acute kidney failure, unspecified: Principal | ICD-10-CM | POA: Diagnosis present

## 2022-01-16 DIAGNOSIS — R54 Age-related physical debility: Secondary | ICD-10-CM | POA: Diagnosis present

## 2022-01-16 DIAGNOSIS — N2889 Other specified disorders of kidney and ureter: Secondary | ICD-10-CM

## 2022-01-16 DIAGNOSIS — R296 Repeated falls: Secondary | ICD-10-CM | POA: Diagnosis present

## 2022-01-16 DIAGNOSIS — E1100 Type 2 diabetes mellitus with hyperosmolarity without nonketotic hyperglycemic-hyperosmolar coma (NKHHC): Secondary | ICD-10-CM

## 2022-01-16 DIAGNOSIS — K5730 Diverticulosis of large intestine without perforation or abscess without bleeding: Secondary | ICD-10-CM | POA: Diagnosis not present

## 2022-01-16 DIAGNOSIS — I10 Essential (primary) hypertension: Secondary | ICD-10-CM | POA: Diagnosis not present

## 2022-01-16 DIAGNOSIS — F0390 Unspecified dementia without behavioral disturbance: Secondary | ICD-10-CM | POA: Insufficient documentation

## 2022-01-16 DIAGNOSIS — I252 Old myocardial infarction: Secondary | ICD-10-CM | POA: Diagnosis not present

## 2022-01-16 DIAGNOSIS — C642 Malignant neoplasm of left kidney, except renal pelvis: Secondary | ICD-10-CM | POA: Diagnosis present

## 2022-01-16 DIAGNOSIS — Z7984 Long term (current) use of oral hypoglycemic drugs: Secondary | ICD-10-CM

## 2022-01-16 DIAGNOSIS — R410 Disorientation, unspecified: Secondary | ICD-10-CM

## 2022-01-16 DIAGNOSIS — R7989 Other specified abnormal findings of blood chemistry: Secondary | ICD-10-CM | POA: Insufficient documentation

## 2022-01-16 DIAGNOSIS — E46 Unspecified protein-calorie malnutrition: Secondary | ICD-10-CM | POA: Diagnosis not present

## 2022-01-16 DIAGNOSIS — N1832 Chronic kidney disease, stage 3b: Secondary | ICD-10-CM | POA: Diagnosis not present

## 2022-01-16 DIAGNOSIS — Z79899 Other long term (current) drug therapy: Secondary | ICD-10-CM

## 2022-01-16 DIAGNOSIS — R791 Abnormal coagulation profile: Secondary | ICD-10-CM | POA: Diagnosis present

## 2022-01-16 DIAGNOSIS — J9811 Atelectasis: Secondary | ICD-10-CM | POA: Diagnosis not present

## 2022-01-16 DIAGNOSIS — I5022 Chronic systolic (congestive) heart failure: Secondary | ICD-10-CM | POA: Diagnosis not present

## 2022-01-16 DIAGNOSIS — R7401 Elevation of levels of liver transaminase levels: Secondary | ICD-10-CM | POA: Diagnosis present

## 2022-01-16 DIAGNOSIS — R531 Weakness: Secondary | ICD-10-CM | POA: Diagnosis not present

## 2022-01-16 DIAGNOSIS — E8721 Acute metabolic acidosis: Secondary | ICD-10-CM | POA: Diagnosis present

## 2022-01-16 DIAGNOSIS — Z811 Family history of alcohol abuse and dependence: Secondary | ICD-10-CM

## 2022-01-16 DIAGNOSIS — E876 Hypokalemia: Secondary | ICD-10-CM | POA: Diagnosis present

## 2022-01-16 DIAGNOSIS — R319 Hematuria, unspecified: Secondary | ICD-10-CM | POA: Diagnosis not present

## 2022-01-16 DIAGNOSIS — E7800 Pure hypercholesterolemia, unspecified: Secondary | ICD-10-CM | POA: Diagnosis not present

## 2022-01-16 DIAGNOSIS — R29818 Other symptoms and signs involving the nervous system: Secondary | ICD-10-CM | POA: Diagnosis not present

## 2022-01-16 DIAGNOSIS — Z7189 Other specified counseling: Secondary | ICD-10-CM

## 2022-01-16 DIAGNOSIS — Z20822 Contact with and (suspected) exposure to covid-19: Secondary | ICD-10-CM | POA: Diagnosis not present

## 2022-01-16 DIAGNOSIS — R9431 Abnormal electrocardiogram [ECG] [EKG]: Secondary | ICD-10-CM | POA: Diagnosis not present

## 2022-01-16 DIAGNOSIS — C801 Malignant (primary) neoplasm, unspecified: Secondary | ICD-10-CM | POA: Diagnosis not present

## 2022-01-16 DIAGNOSIS — Z951 Presence of aortocoronary bypass graft: Secondary | ICD-10-CM

## 2022-01-16 DIAGNOSIS — R627 Adult failure to thrive: Secondary | ICD-10-CM | POA: Diagnosis present

## 2022-01-16 DIAGNOSIS — F411 Generalized anxiety disorder: Secondary | ICD-10-CM | POA: Diagnosis not present

## 2022-01-16 DIAGNOSIS — Z6828 Body mass index (BMI) 28.0-28.9, adult: Secondary | ICD-10-CM

## 2022-01-16 DIAGNOSIS — R451 Restlessness and agitation: Secondary | ICD-10-CM

## 2022-01-16 DIAGNOSIS — G9341 Metabolic encephalopathy: Secondary | ICD-10-CM | POA: Diagnosis not present

## 2022-01-16 DIAGNOSIS — R209 Unspecified disturbances of skin sensation: Secondary | ICD-10-CM | POA: Diagnosis not present

## 2022-01-16 DIAGNOSIS — Z87891 Personal history of nicotine dependence: Secondary | ICD-10-CM

## 2022-01-16 DIAGNOSIS — R3129 Other microscopic hematuria: Secondary | ICD-10-CM | POA: Diagnosis not present

## 2022-01-16 DIAGNOSIS — E875 Hyperkalemia: Secondary | ICD-10-CM | POA: Diagnosis not present

## 2022-01-16 DIAGNOSIS — Z833 Family history of diabetes mellitus: Secondary | ICD-10-CM

## 2022-01-16 DIAGNOSIS — I5032 Chronic diastolic (congestive) heart failure: Secondary | ICD-10-CM

## 2022-01-16 DIAGNOSIS — G319 Degenerative disease of nervous system, unspecified: Secondary | ICD-10-CM | POA: Diagnosis not present

## 2022-01-16 DIAGNOSIS — Z515 Encounter for palliative care: Secondary | ICD-10-CM | POA: Diagnosis not present

## 2022-01-16 DIAGNOSIS — R339 Retention of urine, unspecified: Secondary | ICD-10-CM | POA: Diagnosis not present

## 2022-01-16 DIAGNOSIS — E43 Unspecified severe protein-calorie malnutrition: Secondary | ICD-10-CM | POA: Diagnosis present

## 2022-01-16 DIAGNOSIS — I480 Paroxysmal atrial fibrillation: Secondary | ICD-10-CM | POA: Diagnosis present

## 2022-01-16 DIAGNOSIS — E870 Hyperosmolality and hypernatremia: Secondary | ICD-10-CM | POA: Diagnosis not present

## 2022-01-16 DIAGNOSIS — I130 Hypertensive heart and chronic kidney disease with heart failure and stage 1 through stage 4 chronic kidney disease, or unspecified chronic kidney disease: Secondary | ICD-10-CM | POA: Diagnosis present

## 2022-01-16 DIAGNOSIS — I2510 Atherosclerotic heart disease of native coronary artery without angina pectoris: Secondary | ICD-10-CM | POA: Diagnosis present

## 2022-01-16 DIAGNOSIS — I82220 Acute embolism and thrombosis of inferior vena cava: Secondary | ICD-10-CM | POA: Diagnosis present

## 2022-01-16 DIAGNOSIS — E1122 Type 2 diabetes mellitus with diabetic chronic kidney disease: Secondary | ICD-10-CM | POA: Diagnosis present

## 2022-01-16 DIAGNOSIS — Z7901 Long term (current) use of anticoagulants: Secondary | ICD-10-CM

## 2022-01-16 DIAGNOSIS — R079 Chest pain, unspecified: Secondary | ICD-10-CM | POA: Diagnosis not present

## 2022-01-16 DIAGNOSIS — E860 Dehydration: Secondary | ICD-10-CM | POA: Diagnosis present

## 2022-01-16 DIAGNOSIS — Z66 Do not resuscitate: Secondary | ICD-10-CM | POA: Diagnosis not present

## 2022-01-16 DIAGNOSIS — I4891 Unspecified atrial fibrillation: Secondary | ICD-10-CM | POA: Diagnosis present

## 2022-01-16 DIAGNOSIS — K219 Gastro-esophageal reflux disease without esophagitis: Secondary | ICD-10-CM | POA: Diagnosis not present

## 2022-01-16 DIAGNOSIS — N281 Cyst of kidney, acquired: Secondary | ICD-10-CM | POA: Diagnosis not present

## 2022-01-16 DIAGNOSIS — E871 Hypo-osmolality and hyponatremia: Secondary | ICD-10-CM | POA: Diagnosis not present

## 2022-01-16 DIAGNOSIS — Z781 Physical restraint status: Secondary | ICD-10-CM

## 2022-01-16 DIAGNOSIS — Z8249 Family history of ischemic heart disease and other diseases of the circulatory system: Secondary | ICD-10-CM

## 2022-01-16 DIAGNOSIS — E119 Type 2 diabetes mellitus without complications: Secondary | ICD-10-CM

## 2022-01-16 DIAGNOSIS — R338 Other retention of urine: Secondary | ICD-10-CM | POA: Diagnosis not present

## 2022-01-16 LAB — COMPREHENSIVE METABOLIC PANEL
ALT: 62 U/L — ABNORMAL HIGH (ref 0–44)
AST: 79 U/L — ABNORMAL HIGH (ref 15–41)
Albumin: 3.3 g/dL — ABNORMAL LOW (ref 3.5–5.0)
Alkaline Phosphatase: 239 U/L — ABNORMAL HIGH (ref 38–126)
Anion gap: 18 — ABNORMAL HIGH (ref 5–15)
BUN: 54 mg/dL — ABNORMAL HIGH (ref 8–23)
CO2: 18 mmol/L — ABNORMAL LOW (ref 22–32)
Calcium: 9.1 mg/dL (ref 8.9–10.3)
Chloride: 104 mmol/L (ref 98–111)
Creatinine, Ser: 3 mg/dL — ABNORMAL HIGH (ref 0.61–1.24)
GFR, Estimated: 20 mL/min — ABNORMAL LOW (ref >60)
Glucose, Bld: 119 mg/dL — ABNORMAL HIGH (ref 70–99)
Potassium: 4.4 mmol/L (ref 3.5–5.1)
Sodium: 140 mmol/L (ref 135–145)
Total Bilirubin: 1.9 mg/dL — ABNORMAL HIGH (ref 0.3–1.2)
Total Protein: 7 g/dL (ref 6.5–8.1)

## 2022-01-16 LAB — CBC WITH DIFFERENTIAL/PLATELET
Abs Immature Granulocytes: 0.06 10*3/uL (ref 0.00–0.07)
Basophils Absolute: 0 10*3/uL (ref 0.0–0.1)
Basophils Relative: 0 %
Eosinophils Absolute: 0 10*3/uL (ref 0.0–0.5)
Eosinophils Relative: 0 %
HCT: 40.6 % (ref 39.0–52.0)
Hemoglobin: 12.6 g/dL — ABNORMAL LOW (ref 13.0–17.0)
Immature Granulocytes: 1 %
Lymphocytes Relative: 24 %
Lymphs Abs: 2.2 10*3/uL (ref 0.7–4.0)
MCH: 26 pg (ref 26.0–34.0)
MCHC: 31 g/dL (ref 30.0–36.0)
MCV: 83.9 fL (ref 80.0–100.0)
Monocytes Absolute: 0.9 10*3/uL (ref 0.1–1.0)
Monocytes Relative: 10 %
Neutro Abs: 5.8 10*3/uL (ref 1.7–7.7)
Neutrophils Relative %: 65 %
Platelets: 271 10*3/uL (ref 150–400)
RBC: 4.84 MIL/uL (ref 4.22–5.81)
RDW: 16.7 % — ABNORMAL HIGH (ref 11.5–15.5)
WBC: 9 10*3/uL (ref 4.0–10.5)
nRBC: 0 % (ref 0.0–0.2)

## 2022-01-16 LAB — URINALYSIS, ROUTINE W REFLEX MICROSCOPIC: Bacteria, UA: NONE SEEN

## 2022-01-16 LAB — MAGNESIUM: Magnesium: 2.2 mg/dL (ref 1.7–2.4)

## 2022-01-16 LAB — PROTIME-INR
INR: 7.6 (ref 0.8–1.2)
Prothrombin Time: 64.1 seconds — ABNORMAL HIGH (ref 11.4–15.2)

## 2022-01-16 LAB — TROPONIN I (HIGH SENSITIVITY)
Troponin I (High Sensitivity): 81 ng/L — ABNORMAL HIGH (ref <18)
Troponin I (High Sensitivity): 78 ng/L — ABNORMAL HIGH (ref <18)

## 2022-01-16 LAB — CBG MONITORING, ED: Glucose-Capillary: 117 mg/dL — ABNORMAL HIGH (ref 70–99)

## 2022-01-16 LAB — TSH: TSH: 2.051 u[IU]/mL (ref 0.350–4.500)

## 2022-01-16 MED ORDER — POLYETHYLENE GLYCOL 3350 17 G PO PACK: 17.0000 g | PACK | Freq: Every day | ORAL | Status: DC | PRN

## 2022-01-16 MED ORDER — SODIUM CHLORIDE 0.9 % IV SOLN: INTRAVENOUS | Status: AC

## 2022-01-16 MED ORDER — SODIUM CHLORIDE 0.9% FLUSH
3.0000 mL | Freq: Two times a day (BID) | INTRAVENOUS | Status: DC
  Administered 2022-01-16 – 2022-01-21 (×7): 3 mL via INTRAVENOUS

## 2022-01-16 MED ORDER — ACETAMINOPHEN 650 MG RE SUPP: 650.0000 mg | Freq: Four times a day (QID) | RECTAL | Status: DC | PRN

## 2022-01-16 MED ORDER — INSULIN ASPART 100 UNIT/ML IJ SOLN
0.0000 [IU] | Freq: Three times a day (TID) | INTRAMUSCULAR | Status: DC
  Administered 2022-01-18: 1 [IU] via SUBCUTANEOUS
  Administered 2022-01-18: 2 [IU] via SUBCUTANEOUS
  Administered 2022-01-19: 1 [IU] via SUBCUTANEOUS
  Administered 2022-01-19 – 2022-01-21 (×3): 2 [IU] via SUBCUTANEOUS
  Administered 2022-01-21 – 2022-01-23 (×3): 1 [IU] via SUBCUTANEOUS
  Administered 2022-01-25: 2 [IU] via SUBCUTANEOUS
  Filled 2022-01-16: qty 0.09

## 2022-01-16 MED ORDER — ACETAMINOPHEN 325 MG PO TABS
650.0000 mg | ORAL_TABLET | Freq: Four times a day (QID) | ORAL | Status: DC | PRN
  Administered 2022-01-17 – 2022-01-25 (×5): 650 mg via ORAL
  Filled 2022-01-16 (×5): qty 2

## 2022-01-16 MED ORDER — LACTATED RINGERS IV BOLUS
1000.0000 mL | Freq: Once | INTRAVENOUS | Status: AC
  Administered 2022-01-16: 1000 mL via INTRAVENOUS

## 2022-01-16 MED ORDER — VITAMIN K1 10 MG/ML IJ SOLN
10.0000 mg | Freq: Once | INTRAVENOUS | Status: AC
  Administered 2022-01-16: 10 mg via INTRAVENOUS
  Filled 2022-01-16: qty 1

## 2022-01-16 NOTE — ED Notes (Signed)
Critical lab results given to Dr. Maryan Rued, face to face. PT 64.1 and INR 7.6. JRPRN

## 2022-01-16 NOTE — Progress Notes (Signed)
ANTICOAGULATION CONSULT NOTE - Initial Consult  Pharmacy Consult for warfarin Indication: atrial fibrillation  No Known Allergies  Patient Measurements:     Vital Signs: Temp: 97.6 F (36.4 C) (11/13 1757) Temp Source: Oral (11/13 1757) BP: 129/74 (11/13 1330) Pulse Rate: 63 (11/13 1745)  Labs: Recent Labs    01/23/2022 1406 01/23/2022 1518  HGB 12.6*  --   HCT 40.6  --   PLT 271  --   LABPROT 64.1*  --   INR 7.6*  --   CREATININE 3.00*  --   TROPONINIHS 81* 78*    CrCl cannot be calculated (Unknown ideal weight.).   Medical History: Past Medical History:  Diagnosis Date   A-fib [facility])    resolved 2006   CAD (coronary artery disease)    Diverticulosis    DM type 2 (diabetes mellitus, type 2) (HCC)    GERD (gastroesophageal reflux disease)    Hypercholesterolemia    MI (myocardial infarction) ([place]) 04/1986   preserved OV systolic function with apical akinesis    Obesity     Medications:  On warfarin prior to admission  Assessment: Warfarin consult per Pharmacy for this 81 year old male with a history of atrial fibrillation on Coumadin, prior CAD and MI, and diabetes.     Today, 01/12/2022 Admission INR supratherapeutic at 7.6 and Vitamin K 10 mg IV x 1 administered.  Goal of Therapy:  INR 2-3 Monitor platelets by anticoagulation protocol: Yes   Plan:  Hold warfarin Monitor daily PT/INR, CBC as ordered by provider, signs/symptoms of bleeding    Thank you for allowing pharmacy to be a part of this patient's care.  Royetta Asal, PharmD, BCPS Clinical Pharmacist [place] Please utilize Amion for appropriate phone number to reach the unit pharmacist ([place]) 01/24/2022 7:34 PM

## 2022-01-16 NOTE — ED Triage Notes (Signed)
Patient BIB EMS for possible dehydration, failure to thrive. Increased weakness over the past month. Not getting around as much. No eating and having weight loss.

## 2022-01-16 NOTE — ED Provider Notes (Addendum)
[facility]-EMERGENCY DEPT Provider Note   CSN: [id number] Arrival date & time: 2022/02/09  1258     History  No chief complaint on file.   Albert Short is a 81 y.o. male.  Patient is an 81 year old male with a history of atrial fibrillation on Coumadin, prior CAD and MI, diabetes who lives at home independently and being brought in today by EMS due to persistent decline over the last 3 months.  Daughters report that he was seen in the emergency room at the end of June for weakness.  At that time all the test were negative.  He was discharged home and they report initially he was okay and getting around without any assistance devices then graduated to a cane and then a walker and now he has trouble getting up altogether.  Patient reports he still gets up but when he attempts to walk he gets pain in both legs and they give out on him.  He has had several falls at home but they have been minor.  His family is also concerned because he has been losing weight.  He reports that food just does not taste good anymore but he denies abdominal pain, nausea or vomiting.  He has had no chest pain or shortness of breath.  He reports he just feels tired all the time.  Last month he did see Dr. Kevan Ny and reports that they decreased some of his medications.  He has not started any new medications.  His daughters also feel that at times he seems a little bit confused but is not constant.  He denies recurrent episodes of diarrhea or black charcoal looking stools.  He denies any alcohol use and has not smoked cigarettes in over 20 years.  The history is provided by the patient, medical records and the EMS personnel (daughters).       Home Medications Prior to Admission medications   Medication Sig Start Date End Date Taking? Authorizing Provider  Cholecalciferol (VITAMIN D3) 1000 UNITS CAPS Take 1 capsule (1,000 Units total) by mouth 2 (two) times daily. 11/18/14   Lyn Records, MD   hydrochlorothiazide (HYDRODIURIL) 25 MG tablet Take 0.5 tablets (12.5 mg total) by mouth daily. 12/07/21   Lyn Records, MD  losartan (COZAAR) 100 MG tablet Take 100 mg by mouth daily.  09/11/13   [provider]  metFORMIN (GLUCOPHAGE) 500 MG tablet Take 500 mg by mouth daily with breakfast.     [provider]  metoprolol tartrate (LOPRESSOR) 25 MG tablet Take 1 tablet (25 mg total) by mouth 2 (two) times daily. Please make appt with Dr. Katrinka Blazing for October before anymore refills. 1st attempt 09/17/17   Lyn Records, MD  nitroGLYCERIN (NITROSTAT) 0.4 MG SL tablet Place 0.4 mg under the tongue every 5 (five) minutes as needed for chest pain (no more than 3).     [provider]  simvastatin (ZOCOR) 40 MG tablet Take 1 tablet by mouth daily. 12/04/14   [provider]  traMADol (ULTRAM) 50 MG tablet Take 50 mg by mouth as directed. Patient takes 50mg  twice daily, then as needed can take addt'l tablet 09/19/13   [provider]  warfarin (COUMADIN) 5 MG tablet Take 5 mg by mouth daily at 6 PM. Takes 7.5mg  on sun and wed  Takes 5mg  all other days    [provider]      Allergies    Patient has no known allergies.  Review of Systems   Review of Systems  Physical Exam Updated Vital Signs BP 129/74   Pulse (!) 110   Temp (!) 97.5 F (36.4 C) (Oral)   Resp 20   SpO2 100%  Physical Exam Vitals and nursing note reviewed.  Constitutional:      General: He is not in acute distress.    Appearance: He is well-developed.  HENT:     Head: Normocephalic and atraumatic.  Eyes:     Conjunctiva/sclera: Conjunctivae normal.     Pupils: Pupils are equal, round, and reactive to light.  Cardiovascular:     Rate and Rhythm: Regular rhythm. Bradycardia present.     Heart sounds: No murmur heard.    Comments: 2+ radial pulses bilaterally.  However unable to palpate DP pulses bilaterally.  Feet are cool but capillary refill is approximately 3  seconds.  Femoral pulses are not readily palpated either. Pulmonary:     Effort: Pulmonary effort is normal. No respiratory distress.     Breath sounds: Normal breath sounds. No wheezing or rales.  Abdominal:     General: There is no distension.     Palpations: Abdomen is soft.     Tenderness: There is no abdominal tenderness. There is no guarding or rebound.  Musculoskeletal:        General: No tenderness. Normal range of motion.     Cervical back: Normal range of motion and neck supple.     Right lower leg: No edema.     Left lower leg: No edema.  Skin:    General: Skin is warm and dry.     Findings: No erythema or rash.  Neurological:     Mental Status: He is alert and oriented to person, place, and time. Mental status is at baseline.     Comments: 5-5 strength bilateral upper extremities without pronator drift.  4-5 strength in bilateral lower extremities.  Pronator drift noted on bilateral lower extremities right greater than left.  Speech is clear without aphasia.  No facial droop.  No sensory deficits.  Patient is able to sit up in bed independently without difficulties are noted weakness in the trunk or upper extremities.  Psychiatric:        Behavior: Behavior normal.     Comments: Calm and cooperative     ED Results / Procedures / Treatments   Labs (all labs ordered are listed, but only abnormal results are displayed) Labs Reviewed  CBC WITH DIFFERENTIAL/PLATELET - Abnormal; Notable for the following components:      Result Value   Hemoglobin 12.6 (*)    RDW 16.7 (*)    All other components within normal limits  COMPREHENSIVE METABOLIC PANEL - Abnormal; Notable for the following components:   CO2 18 (*)    Glucose, Bld 119 (*)    BUN 54 (*)    Creatinine, Ser 3.00 (*)    Albumin 3.3 (*)    AST 79 (*)    ALT 62 (*)    Alkaline Phosphatase 239 (*)    Total Bilirubin 1.9 (*)    GFR, Estimated 20 (*)    Anion gap 18 (*)    All other components within normal limits   PROTIME-INR - Abnormal; Notable for the following components:   Prothrombin Time 64.1 (*)    INR 7.6 (*)    All other components within normal limits  TROPONIN I (HIGH SENSITIVITY) - Abnormal; Notable for the following components:   Troponin I (High Sensitivity) 81 (*)  All other components within normal limits  TROPONIN I (HIGH SENSITIVITY) - Abnormal; Notable for the following components:   Troponin I (High Sensitivity) 78 (*)    All other components within normal limits  MAGNESIUM  URINALYSIS, ROUTINE W REFLEX MICROSCOPIC  TSH    EKG EKG Interpretation  Date/Time:  Monday January 16 2022 14:56:25 EST Ventricular Rate:  61 PR Interval:  160 QRS Duration: 81 QT Interval:  451 QTC Calculation: 455 R Axis:   20 Text Interpretation: Sinus arrhythmia Ventricular premature complex Low voltage, precordial leads Nonspecific T abnrm, anterolateral leads No significant change since last tracing Confirmed by Gwyneth Sprout ([id number]) on 01/16/2022 3:21:12 PM  Radiology CT Head Wo Contrast  Result Date: 01/16/2022 CLINICAL DATA:  Provided history: Neuro deficit, acute, stroke suspected. Additional history provided: Possible dehydration, failure to thrive, increased weakness over the past month. EXAM: CT HEAD WITHOUT CONTRAST TECHNIQUE: Contiguous axial images were obtained from the base of the skull through the vertex without intravenous contrast. RADIATION DOSE REDUCTION: This exam was performed according to the departmental dose-optimization program which includes automated exposure control, adjustment of the mA and/or kV according to patient size and/or use of iterative reconstruction technique. COMPARISON:  Head CT 08/30/2021. FINDINGS: Brain: Frontoparietal predominant cerebral atrophy. Commensurate prominence of the ventricles and sulci. Unchanged asymmetric CSF prominence posterior to the left cerebellar hemisphere (measuring up to 10 mm in thickness). This is suspicious for a small  chronic subdural hygroma. There is no acute intracranial hemorrhage. No demarcated cortical infarct. No evidence of an intracranial mass. No midline shift. Vascular: No hyperdense vessel. Atherosclerotic calcifications. Skull: No fracture or aggressive osseous lesion. Sinuses/Orbits: No mass or acute finding within the imaged orbits. Minimal mucosal thickening within right ethmoid air cells. IMPRESSION: 1. No evidence of acute intracranial abnormality. 2. 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 3. Frontoparietal predominant cerebral atrophy. Electronically Signed   By: Jackey Loge D.O.   On: 01/16/2022 14:04   DG Chest Port 1 View  Result Date: 01/16/2022 CLINICAL DATA:  Weakness. EXAM: PORTABLE CHEST 1 VIEW COMPARISON:  Chest radiographs 07/14/2021 FINDINGS: Sequelae of CABG are again identified. The cardiomediastinal silhouette is unchanged with normal heart size. Lung volumes are decreased. No airspace consolidation, edema, pleural effusion, or pneumothorax is identified. No acute osseous abnormality is seen. IMPRESSION: No active disease. Electronically Signed   By: Sebastian Ache M.D.   On: 01/16/2022 14:01    Procedures Procedures    Medications Ordered in ED Medications  phytonadione (VITAMIN K) 10 mg in dextrose 5 % 50 mL IVPB (has no administration in time range)  lactated ringers bolus 1,000 mL (1,000 mLs Intravenous New Bag/Given 01/16/22 1414)    ED Course/ Medical Decision Making/ A&P Clinical Course as of 01/17/22 0735  Mon Jan 16, 2022  1703 Assumed care from Dr Anitra Lauth. 81 yo M on coumadin for AF presented with FTT and weakness with walking and hematuria. Seems AAOx2 today (not at baseline). Gives himself his own meds. Says legs have been giving out and had falls. Given vitamin K. Elevated troponins but flat. Also with AKI and mildly elevated LFTs. Will obtain CT of the abdomen pelvis to  evaluate for cause of his AKI and LFT abnormalities.  [RP]  1831 Hemoglobin(!): 12.6 At baseline [RP]  1857 CT did show a left kidney mass without signs of obstruction.  Spoke with Dr Alinda Money from hospitalist will admit patient for AKI.  [  RP]    Clinical Course User Index [RP] Rondel Baton, MD                           Medical Decision Making Amount and/or Complexity of Data Reviewed Independent Historian: spouse and EMS External Data Reviewed: notes. Labs: ordered. Decision-making details documented in ED Course. Radiology: ordered and independent interpretation performed. Decision-making details documented in ED Course. ECG/medicine tests: ordered and independent interpretation performed. Decision-making details documented in ED Course.  Risk Decision regarding hospitalization.   Pt with multiple medical problems and comorbidities and presenting today with a complaint that caries a high risk for morbidity and mortality.  Aggressive decline from home with weakness in the bilateral lower extremities.  Concern for PAD as patient does not have easily palpated pulses and reports he gets pain when attempting to walk and his legs give out, concern for spinal pathology, electrolyte abnormality, cancerous etiology.  Patient's vital signs are reassuring.  He is awake and alert.  He has had multiple falls as he has several abrasions that are in various stages of healing.  Also possibility for acute intracranial pathology.  I have independently visualized and interpreted pt's images today.  Head CT negative for intracranial hemorrhage and chest x-ray without acute findings.  I independently interpreted patient's labs and EKG today.  EKG without acute changes.  CBC today without acute findings and stable hemoglobin of 12.6, CMP with new AKI today with creatinine of 3.0, elevated LFTs with an AST of 79, ALT of 62, total bilirubin of 1.9 and an anion gap of 18.  Electrolytes are within normal limits,  INR is supratherapeutic today at 7.9 and troponins are 81 and 78 with a normal magnesium.  Pulses were dopplerable in bilateral lower extremities.  Per the nurse urine his blood appeared grossly bloody.  He was reversed with vitamin K.  He does not have any valve repair or prior PEs.  He is on Coumadin for atrial fibrillation.  Given his elevated LFTs, grossly bloody urine, new AKI feel that patient will need to be admitted but we will do a CT to further evaluate given patient's worsening appetite to ensure there is no new masses or obstructions.  Patient checked out to Dr. Jarold Motto.  CRITICAL CARE Performed by: Cliff Damiani Total critical care time: 30 minutes Critical care time was exclusive of separately billable procedures and treating other patients. Critical care was necessary to treat or prevent imminent or life-threatening deterioration. Critical care was time spent personally by me on the following activities: development of treatment plan with patient and/or surrogate as well as nursing, discussions with consultants, evaluation of patient's response to treatment, examination of patient, obtaining history from patient or surrogate, ordering and performing treatments and interventions, ordering and review of laboratory studies, ordering and review of radiographic studies, pulse oximetry and re-evaluation of patient's condition.           Final Clinical Impression(s) / ED Diagnoses Final diagnoses:  Supratherapeutic INR  AKI (acute kidney injury) (HCC)  Transaminitis    Rx / DC Orders ED Discharge Orders     None         Gwyneth Sprout, MD 2022-02-04 1725    Gwyneth Sprout, MD 01/17/22 [phone]

## 2022-01-16 NOTE — ED Provider Notes (Signed)
  Physical Exam  BP 129/74   Pulse 63   Temp 97.6 F (36.4 C) (Oral)   Resp 20   SpO2 (!) 82%   Physical Exam  Procedures  Procedures  ED Course / MDM   Clinical Course as of 01/17/2022 1900  Mon Jan 16, 2022  1703 Assumed care from Dr Maryan Rued. 81 yo M on coumadin for AF presented with FTT and weakness with walking and hematuria. Seems AAOx2 today (not at baseline). Gives himself his own meds. Says legs have been giving out and had falls. Given vitamin K. Elevated troponins but flat. Also with AKI and mildly elevated LFTs. Will obtain CT of the abdomen pelvis to evaluate for cause of his AKI and LFT abnormalities.  [RP]  1540 Hemoglobin(!): 12.6 At baseline [RP]  0867 CT did show a left kidney mass without signs of obstruction.  Spoke with Dr Trilby Drummer from hospitalist will admit patient for AKI.  [RP]    Clinical Course User Index [RP] Fransico Meadow, MD   Medical Decision Making Amount and/or Complexity of Data Reviewed Labs: ordered. Decision-making details documented in ED Course. Radiology: ordered.  Risk Decision regarding hospitalization.      Fransico Meadow, MD 01/06/2022 1900

## 2022-01-16 NOTE — ED Notes (Signed)
Pt has pulled out his second peripheral IV. New dressing applied to left hand.

## 2022-01-16 NOTE — H&P (Signed)
History and Physical   Albert Short HKV:[id number] DOB: February 25, 1941 DOA: 01/13/2022  PCP: Josetta Huddle, MD   Patient coming from: Home  Chief Complaint: General decline  HPI: Albert Short is a 81 y.o. male with medical history significant of CHF, atrial fibrillation, CAD, hyperlipidemia, obesity, diverticulosis, diabetes, GERD, hypertension presenting with generalized decline.  History obtained with assistance of family and chart review.  Reported the patient was seen in the ED in June for weakness.  Work-up at that time was negative and patient was discharged home.  Initially he seemed to be doing okay but he has had a gradual decline and initially needed a cane to assist with walking and then a walker and now is having some issues walking at all.  Patient reports that he is able to stand okay but he gets pain in his legs when he tries to walk and that they seem to "give out ".  He has had some minor falls per family but has not hit his head.  Family also concern for decreased weight loss in the setting of decreased p.o. intake.  Patient reports food does not seem to taste as good anymore and does not have an appetite.  Patient also reports feeling generally more tired and reports some nausea and vomiting this week.  And family reports some intermittent confusion.  He denies fevers, chills, chest pain, shortness of breath, abdominal pain, constipation, diarrhea.  ED Course: Vital signs in the ED significant for heart rate in the 100s initially but improved to the 60s with IV fluids.  Blood pressure in the 564P to 329J systolic.  Lab work-up included CMP with bicarb 18, BUN 54, creatinine elevated to 3 from previous baseline of 1.5-1.9, glucose 119, albumin 3.3, AST 79, ALT 62, ALP 239, T. bili 1.9.  CBC with hemoglobin near baseline at 12.6.  PT and INR elevated at 64.1 and 7.6 respectively.  Troponin mildly elevated but flat at 81 and then 78 on repeat.  TSH pending.  Urinalysis pending.   Magnesium normal.  Chest x-ray showed no acute abnormality.  CT head showed no acute or maladies showed stable evidence of small chronic hygroma.  CT of the abdomen pelvis showed lobular contours of the left kidney with recommendation for follow-up ultrasound or MRI.  Patient received vitamin K and a liter of fluids in the ED.  Review of Systems: As per HPI otherwise all other systems reviewed and are negative.  Past Medical History:  Diagnosis Date   A-fib [facility])    resolved 2006   CAD (coronary artery disease)    Diverticulosis    DM type 2 (diabetes mellitus, type 2) (HCC)    GERD (gastroesophageal reflux disease)    Hypercholesterolemia    MI (myocardial infarction) ([place]) 04/1986   preserved OV systolic function with apical akinesis    Obesity     Past Surgical History:  Procedure Laterality Date   CARDIAC CATHETERIZATION  13-Sep-1939   Dr Daneen Schick   CORONARY ARTERY BYPASS GRAFT     Coronary artery bypass grafting x4  10/27/10   [place] [facility]um    Social History  reports that he quit smoking about 35 years ago. His smoking use included cigarettes. He has never used smokeless tobacco. He reports that he does not drink alcohol and does not use drugs.  No Known Allergies  Family History  Problem Relation Age of Onset   Heart disease Brother    Diabetes Mother    Alcoholism Father  Heart disease Paternal Grandfather   Reviewed on admission  Prior to Admission medications   Medication Sig Start Date End Date Taking? Authorizing Provider  Cholecalciferol (VITAMIN D3) 1000 UNITS CAPS Take 1 capsule (1,000 Units total) by mouth 2 (two) times daily. 11/18/14   Belva Crome, MD  hydrochlorothiazide (HYDRODIURIL) 25 MG tablet Take 0.5 tablets (12.5 mg total) by mouth daily. 12/07/21   Belva Crome, MD  losartan (COZAAR) 100 MG tablet Take 100 mg by mouth daily.  09/11/13   [provider]  metFORMIN (GLUCOPHAGE) 500 MG tablet Take 500 mg by mouth daily with breakfast.      [provider]  metoprolol tartrate (LOPRESSOR) 25 MG tablet Take 1 tablet (25 mg total) by mouth 2 (two) times daily. Please make appt with Dr. Tamala Julian for October before anymore refills. 1st attempt 09/17/17   Belva Crome, MD  nitroGLYCERIN (NITROSTAT) 0.4 MG SL tablet Place 0.4 mg under the tongue every 5 (five) minutes as needed for chest pain (no more than 3).     [provider]  simvastatin (ZOCOR) 40 MG tablet Take 1 tablet by mouth daily. 12/04/14   [provider]  traMADol (ULTRAM) 50 MG tablet Take 50 mg by mouth as directed. Patient takes '50mg'$  twice daily, then as needed can take addt'l tablet 09/19/13   [provider]  warfarin (COUMADIN) 5 MG tablet Take 5 mg by mouth daily at 6 PM. Takes 7.'5mg'$  on sun and wed  Takes '5mg'$  all other days    [provider]    Physical Exam: Vitals:   01/06/2022 1715 02/01/2022 1730 01/17/2022 1745 01/30/2022 1757  BP:      Pulse: (!) 33 82 63   Resp: 17 (!) 22 20   Temp:    97.6 F (36.4 C)  TempSrc:    Oral  SpO2: 91% 96% (!) 82%     Physical Exam Constitutional:      General: He is not in acute distress.    Appearance: Normal appearance.  HENT:     Head: Normocephalic and atraumatic.     Mouth/Throat:     Mouth: Mucous membranes are moist.     Pharynx: Oropharynx is clear.  Eyes:     Extraocular Movements: Extraocular movements intact.     Pupils: Pupils are equal, round, and reactive to light.  Cardiovascular:     Rate and Rhythm: Normal rate. Rhythm irregular.     Pulses: Normal pulses.     Heart sounds: Normal heart sounds.  Pulmonary:     Effort: Pulmonary effort is normal. No respiratory distress.     Breath sounds: Normal breath sounds.  Abdominal:     General: Bowel sounds are normal. There is no distension.     Palpations: Abdomen is soft.     Tenderness: There is no abdominal tenderness.  Musculoskeletal:        General: No swelling or deformity.  Skin:    General: Skin is warm  and dry.  Neurological:     General: No focal deficit present.     Comments: Alert and oriented.  Upper extremity strength 5/5 bilaterally.  Lower extremity strength 4-5/5 bilaterally.     Labs on Admission: I have personally reviewed following labs and imaging studies  CBC: Recent Labs  Lab 01/13/2022 1406  WBC 9.0  NEUTROABS 5.8  HGB 12.6*  HCT 40.6  MCV 83.9  PLT 264    Basic Metabolic Panel: Recent Labs  Lab  01/11/2022 1406  NA 140  K 4.4  CL 104  CO2 18*  GLUCOSE 119*  BUN 54*  CREATININE 3.00*  CALCIUM 9.1  MG 2.2    GFR: CrCl cannot be calculated (Unknown ideal weight.).  Liver Function Tests: Recent Labs  Lab 02/02/2022 1406  AST 79*  ALT 62*  ALKPHOS 239*  BILITOT 1.9*  PROT 7.0  ALBUMIN 3.3*    Urine analysis:    Component Value Date/Time   COLORURINE RED (A) 01/05/2022 1648   APPEARANCEUR TURBID (A) 01/08/2022 1648   LABSPEC  01/06/2022 1648    TEST NOT REPORTED DUE TO COLOR INTERFERENCE OF URINE PIGMENT   PHURINE  01/07/2022 1648    TEST NOT REPORTED DUE TO COLOR INTERFERENCE OF URINE PIGMENT   GLUCOSEU (A) 01/17/2022 1648    TEST NOT REPORTED DUE TO COLOR INTERFERENCE OF URINE PIGMENT   HGBUR (A) 02/01/2022 1648    TEST NOT REPORTED DUE TO COLOR INTERFERENCE OF URINE PIGMENT   BILIRUBINUR (A) 01/25/2022 1648    TEST NOT REPORTED DUE TO COLOR INTERFERENCE OF URINE PIGMENT   KETONESUR (A) 01/13/2022 1648    TEST NOT REPORTED DUE TO COLOR INTERFERENCE OF URINE PIGMENT   PROTEINUR (A) 01/14/2022 1648    TEST NOT REPORTED DUE TO COLOR INTERFERENCE OF URINE PIGMENT   UROBILINOGEN 1.0 11/22/2014 2055   NITRITE (A) 01/04/2022 1648    TEST NOT REPORTED DUE TO COLOR INTERFERENCE OF URINE PIGMENT   LEUKOCYTESUR (A) 01/11/2022 1648    TEST NOT REPORTED DUE TO COLOR INTERFERENCE OF URINE PIGMENT    Radiological Exams on Admission: CT ABDOMEN PELVIS WO CONTRAST  Result Date: 02/01/2022 CLINICAL DATA:  Gross hematuriaFailure to thrive EXAM:.:  EXAM:. CT ABDOMEN AND PELVIS WITHOUT CONTRAST TECHNIQUE: Multidetector CT imaging of the abdomen and pelvis was performed following the standard protocol without IV contrast. RADIATION DOSE REDUCTION: This exam was performed according to the departmental dose-optimization program which includes automated exposure control, adjustment of the mA and/or kV according to patient size and/or use of iterative reconstruction technique. COMPARISON:  None Available. FINDINGS: Lower chest: Lung bases are clear. Hepatobiliary: No focal hepatic lesion. No biliary duct dilatation. Common bile duct is normal. Pancreas: Pancreas is normal. No ductal dilatation. No pancreatic inflammation. Spleen: Normal spleen Adrenals/urinary tract: Adrenal glands normal. No nephrolithiasis or ureterolithiasis. No obstructive uropathy. No bladder calculi On sagittal imaging 18 there is a lobular contour to the upper pole of the LEFT kidney measuring 2.4 x 2.2 cm (image 142/6. Stomach/Bowel: Stomach, small bowel, appendix, and cecum are normal. The colon and rectosigmoid colon are normal. Vascular/Lymphatic: Abdominal aorta is normal caliber with atherosclerotic calcification. There is no retroperitoneal or periportal lymphadenopathy. No pelvic lymphadenopathy. Reproductive: Prostate unremarkable Other: No free fluid. Musculoskeletal: No aggressive osseous lesion. Degenerative osteophytosis of the spine. IMPRESSION: 1. No nephrolithiasis ureterolithiasis or obstructive uropathy. 2. No bladder calculi. 3. Lobular contour to the upper pole of the LEFT kidney. Recommend renal ultrasound (or MRI) to exclude a LEFT renal mass. 4.  Aortic Atherosclerosis (ICD10-I70.0). 5. Electronically Signed   By: Suzy Bouchard M.D.   On: 01/22/2022 18:20   CT Head Wo Contrast  Result Date: 01/29/2022 CLINICAL DATA:  Provided history: Neuro deficit, acute, stroke suspected. Additional history provided: Possible dehydration, failure to thrive, increased weakness  over the past month. EXAM: CT HEAD WITHOUT CONTRAST TECHNIQUE: Contiguous axial images were obtained from the base of the skull through the vertex without intravenous contrast. RADIATION DOSE REDUCTION: This exam  was performed according to the departmental dose-optimization program which includes automated exposure control, adjustment of the mA and/or kV according to patient size and/or use of iterative reconstruction technique. COMPARISON:  Head CT 08/30/2021. FINDINGS: Brain: Frontoparietal predominant cerebral atrophy. Commensurate prominence of the ventricles and sulci. Unchanged asymmetric CSF prominence posterior to the left cerebellar hemisphere (measuring up to 10 mm in thickness). 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 There is no acute intracranial hemorrhage. No demarcated cortical infarct. No evidence of an intracranial mass. No midline shift. Vascular: No hyperdense vessel. Atherosclerotic calcifications. Skull: No fracture or aggressive osseous lesion. Sinuses/Orbits: No mass or acute finding within the imaged orbits. Minimal mucosal thickening within right ethmoid air cells. IMPRESSION: 1. No evidence of acute intracranial abnormality. 2. 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 3. Frontoparietal predominant cerebral atrophy. Electronically Signed   By: Kellie Simmering D.O.   On: 01/25/2022 14:04   DG Chest Port 1 View  Result Date: 01/05/2022 CLINICAL DATA:  Weakness. EXAM: PORTABLE CHEST 1 VIEW COMPARISON:  Chest radiographs 07/14/2021 FINDINGS: Sequelae of CABG are again identified. The cardiomediastinal silhouette is unchanged with normal heart size. Lung volumes are decreased. No airspace consolidation, edema, pleural effusion, or pneumothorax is identified. No acute osseous abnormality is seen. IMPRESSION: No active disease. Electronically Signed   By: Logan Bores M.D.   On:  02/01/2022 14:01    EKG: Independently reviewed.  Sinus arrhythmia at 61 bpm.  PVC noted.  Low voltage multiple leads.  Nonspecific T wave flattening.  Previous showed A-fib rhythm.  Assessment/Plan Principal Problem:   AKI (acute kidney injury) ([place]) Active Problems:   A-fib (HCC)   Hypercholesterolemia   Obesity   GERD (gastroesophageal reflux disease)   CAD (coronary artery disease)   DM type 2 (diabetes mellitus, type 2) (HCC)   Chronic diastolic heart failure (HCC)   Hypertension   AKI > Creatinine in ED noted to be elevated to 3 from baseline of 1.4-1.9. > In the setting of decreased p.o. intake presumably prerenal.  No obstructive uropathy noted on CT.  Was abnormality of left kidney as below. - Received a liter of fluids in the ED - Continue with IV fluids - Trend renal function and electrolytes  Supratherapeutic INR > Noted to have INR of 7.6 in ED.  Is on warfarin for A-fib. > May be related to patient's laboratory abnormalities versus some intermittent confusion as below.  He does do his own medications. > Received vitamin K in the ED. - Monitor on telemetry until bleeding risk is reduced - Continue to trend INR - Warfarin per pharmacy for now - May benefit from transition to alternative agent or discontinuation given risks and benefits  Failure to thrive Protein calorie malnutrition Confusion > Has had general decline for the past several months.  Initially needing a cane to walk, then a walker, then difficulty walking altogether. > Patient states that he feels like he can stand okay but then has pain in his legs and they give out when he tries to walk. > Weakness bilateral lower extremities normal strength upper extremities. > Noted to have albumin of 3.3.  In the setting of decreased p.o. intake likely malnourished. > Wife reports he may have trouble swallowing, though patient denies this. - PT and OT eval and treat - Consult to dietitian - Pending evaluation  he may benefit from MRI in future  Abnormal CT abdomen  pelvis > Abnormal shape of left kidney.  Recommendation for follow-up ultrasound or MRI for further evaluation. - We will start with renal ultrasound. - If MRI indicated would likely benefit of study that can evaluate both his spine and his kidney due to findings above.  CHF > Last echo in 2016 with EF 45-50%, apical akinesis due to history of MI in 7342, grade 1 diastolic dysfunction. - Continue home metoprolol - Holding home losartan in the setting of AKI  CAD Hyperlipidemia > History of MI in 1988, apical akinesis likely secondary to this on echo in 2016. - Continue home metoprolol - Holding home losartan and hydrochlorothiazide in setting of AKI - On warfarin which is being held for supratherapeutic INR as above.  Hypertension - Continue home metoprolol  Diabetes - SSI  DVT prophylaxis: Warfarin once INR returns to therapeutic levels. Code Status:   Full Family Communication:  Wife updated by phone Disposition Plan:   Patient is from:  Home  Anticipated DC to:  Home  Anticipated DC date:  1 to 3 days  Anticipated DC barriers: None  Consults called:  None Admission status:  Observation, telemetry  Severity of Illness: The appropriate patient status for this patient is OBSERVATION. Observation status is judged to be reasonable and necessary in order to provide the required intensity of service to ensure the patient's safety. The patient's presenting symptoms, physical exam findings, and initial radiographic and laboratory data in the context of their medical condition is felt to place them at decreased risk for further clinical deterioration. Furthermore, it is anticipated that the patient will be medically stable for discharge from the hospital within 2 midnights of admission.    Marcelyn Bruins MD [facility]  How to contact the [facility] Attending or Consulting provider [place] or covering provider during after  hours [place], for this patient?   Check the care team in [facility] and look for a) attending/consulting TR[facility] provider listed and b) the [facility] team listed Log into [email or web address] and use [place]'s universal password to access. If you do not have the password, please contact the hospital operator. Locate the [facility] provider you are looking for under [facility] and page to a number that you can be directly reached. If you still have difficulty reaching the provider, please page the [facility] (Director on Call) for the Hospitalists listed on amion for assistance.  01/23/2022, 6:59 PM

## 2022-01-17 ENCOUNTER — Other Ambulatory Visit (HOSPITAL_COMMUNITY): Payer: Medicare HMO

## 2022-01-17 ENCOUNTER — Encounter (HOSPITAL_COMMUNITY): Payer: Self-pay | Admitting: Internal Medicine

## 2022-01-17 DIAGNOSIS — K5730 Diverticulosis of large intestine without perforation or abscess without bleeding: Secondary | ICD-10-CM | POA: Diagnosis not present

## 2022-01-17 DIAGNOSIS — J9811 Atelectasis: Secondary | ICD-10-CM | POA: Diagnosis not present

## 2022-01-17 DIAGNOSIS — E7800 Pure hypercholesterolemia, unspecified: Secondary | ICD-10-CM | POA: Diagnosis present

## 2022-01-17 DIAGNOSIS — C801 Malignant (primary) neoplasm, unspecified: Secondary | ICD-10-CM | POA: Diagnosis not present

## 2022-01-17 DIAGNOSIS — E8721 Acute metabolic acidosis: Secondary | ICD-10-CM | POA: Diagnosis present

## 2022-01-17 DIAGNOSIS — R7989 Other specified abnormal findings of blood chemistry: Secondary | ICD-10-CM | POA: Diagnosis not present

## 2022-01-17 DIAGNOSIS — E876 Hypokalemia: Secondary | ICD-10-CM | POA: Diagnosis present

## 2022-01-17 DIAGNOSIS — N179 Acute kidney failure, unspecified: Secondary | ICD-10-CM | POA: Diagnosis present

## 2022-01-17 DIAGNOSIS — R531 Weakness: Secondary | ICD-10-CM | POA: Diagnosis not present

## 2022-01-17 DIAGNOSIS — K219 Gastro-esophageal reflux disease without esophagitis: Secondary | ICD-10-CM | POA: Diagnosis present

## 2022-01-17 DIAGNOSIS — R079 Chest pain, unspecified: Secondary | ICD-10-CM | POA: Diagnosis not present

## 2022-01-17 DIAGNOSIS — I252 Old myocardial infarction: Secondary | ICD-10-CM | POA: Diagnosis not present

## 2022-01-17 DIAGNOSIS — R791 Abnormal coagulation profile: Secondary | ICD-10-CM | POA: Diagnosis not present

## 2022-01-17 DIAGNOSIS — I82220 Acute embolism and thrombosis of inferior vena cava: Secondary | ICD-10-CM | POA: Diagnosis present

## 2022-01-17 DIAGNOSIS — N1832 Chronic kidney disease, stage 3b: Secondary | ICD-10-CM | POA: Diagnosis present

## 2022-01-17 DIAGNOSIS — Z515 Encounter for palliative care: Secondary | ICD-10-CM | POA: Diagnosis not present

## 2022-01-17 DIAGNOSIS — I5022 Chronic systolic (congestive) heart failure: Secondary | ICD-10-CM | POA: Diagnosis present

## 2022-01-17 DIAGNOSIS — E1100 Type 2 diabetes mellitus with hyperosmolarity without nonketotic hyperglycemic-hyperosmolar coma (NKHHC): Secondary | ICD-10-CM | POA: Diagnosis not present

## 2022-01-17 DIAGNOSIS — I130 Hypertensive heart and chronic kidney disease with heart failure and stage 1 through stage 4 chronic kidney disease, or unspecified chronic kidney disease: Secondary | ICD-10-CM | POA: Diagnosis present

## 2022-01-17 DIAGNOSIS — E870 Hyperosmolality and hypernatremia: Secondary | ICD-10-CM | POA: Diagnosis not present

## 2022-01-17 DIAGNOSIS — E875 Hyperkalemia: Secondary | ICD-10-CM | POA: Diagnosis not present

## 2022-01-17 DIAGNOSIS — Z66 Do not resuscitate: Secondary | ICD-10-CM | POA: Diagnosis not present

## 2022-01-17 DIAGNOSIS — R338 Other retention of urine: Secondary | ICD-10-CM | POA: Diagnosis not present

## 2022-01-17 DIAGNOSIS — I480 Paroxysmal atrial fibrillation: Secondary | ICD-10-CM | POA: Diagnosis present

## 2022-01-17 DIAGNOSIS — R209 Unspecified disturbances of skin sensation: Secondary | ICD-10-CM | POA: Diagnosis not present

## 2022-01-17 DIAGNOSIS — I10 Essential (primary) hypertension: Secondary | ICD-10-CM | POA: Diagnosis not present

## 2022-01-17 DIAGNOSIS — F411 Generalized anxiety disorder: Secondary | ICD-10-CM | POA: Diagnosis not present

## 2022-01-17 DIAGNOSIS — G9341 Metabolic encephalopathy: Secondary | ICD-10-CM | POA: Diagnosis present

## 2022-01-17 DIAGNOSIS — E860 Dehydration: Secondary | ICD-10-CM | POA: Diagnosis present

## 2022-01-17 DIAGNOSIS — R319 Hematuria, unspecified: Secondary | ICD-10-CM | POA: Diagnosis not present

## 2022-01-17 DIAGNOSIS — Z20822 Contact with and (suspected) exposure to covid-19: Secondary | ICD-10-CM | POA: Diagnosis present

## 2022-01-17 DIAGNOSIS — F0390 Unspecified dementia without behavioral disturbance: Secondary | ICD-10-CM | POA: Diagnosis not present

## 2022-01-17 DIAGNOSIS — R9431 Abnormal electrocardiogram [ECG] [EKG]: Secondary | ICD-10-CM | POA: Diagnosis not present

## 2022-01-17 DIAGNOSIS — R627 Adult failure to thrive: Secondary | ICD-10-CM | POA: Diagnosis present

## 2022-01-17 DIAGNOSIS — E1122 Type 2 diabetes mellitus with diabetic chronic kidney disease: Secondary | ICD-10-CM | POA: Diagnosis present

## 2022-01-17 DIAGNOSIS — Z7189 Other specified counseling: Secondary | ICD-10-CM | POA: Diagnosis not present

## 2022-01-17 DIAGNOSIS — N2889 Other specified disorders of kidney and ureter: Secondary | ICD-10-CM | POA: Diagnosis not present

## 2022-01-17 DIAGNOSIS — E43 Unspecified severe protein-calorie malnutrition: Secondary | ICD-10-CM | POA: Diagnosis present

## 2022-01-17 DIAGNOSIS — I2510 Atherosclerotic heart disease of native coronary artery without angina pectoris: Secondary | ICD-10-CM | POA: Diagnosis not present

## 2022-01-17 DIAGNOSIS — E669 Obesity, unspecified: Secondary | ICD-10-CM | POA: Diagnosis present

## 2022-01-17 DIAGNOSIS — C642 Malignant neoplasm of left kidney, except renal pelvis: Secondary | ICD-10-CM | POA: Diagnosis present

## 2022-01-17 DIAGNOSIS — R7401 Elevation of levels of liver transaminase levels: Secondary | ICD-10-CM | POA: Diagnosis not present

## 2022-01-17 DIAGNOSIS — Z781 Physical restraint status: Secondary | ICD-10-CM | POA: Diagnosis not present

## 2022-01-17 LAB — CBC
HCT: 39.6 % (ref 39.0–52.0)
Hemoglobin: 12.5 g/dL — ABNORMAL LOW (ref 13.0–17.0)
MCH: 26.3 pg (ref 26.0–34.0)
MCHC: 31.6 g/dL (ref 30.0–36.0)
MCV: 83.4 fL (ref 80.0–100.0)
Platelets: 242 10*3/uL (ref 150–400)
RBC: 4.75 MIL/uL (ref 4.22–5.81)
RDW: 16.6 % — ABNORMAL HIGH (ref 11.5–15.5)
WBC: 9.8 10*3/uL (ref 4.0–10.5)
nRBC: 0 % (ref 0.0–0.2)

## 2022-01-17 LAB — COMPREHENSIVE METABOLIC PANEL
ALT: 71 U/L — ABNORMAL HIGH (ref 0–44)
AST: 83 U/L — ABNORMAL HIGH (ref 15–41)
Albumin: 3.3 g/dL — ABNORMAL LOW (ref 3.5–5.0)
Alkaline Phosphatase: 222 U/L — ABNORMAL HIGH (ref 38–126)
Anion gap: 12 (ref 5–15)
BUN: 53 mg/dL — ABNORMAL HIGH (ref 8–23)
CO2: 22 mmol/L (ref 22–32)
Calcium: 9.3 mg/dL (ref 8.9–10.3)
Chloride: 108 mmol/L (ref 98–111)
Creatinine, Ser: 2.73 mg/dL — ABNORMAL HIGH (ref 0.61–1.24)
GFR, Estimated: 23 mL/min — ABNORMAL LOW (ref >60)
Glucose, Bld: 121 mg/dL — ABNORMAL HIGH (ref 70–99)
Potassium: 5 mmol/L (ref 3.5–5.1)
Sodium: 142 mmol/L (ref 135–145)
Total Bilirubin: 2.3 mg/dL — ABNORMAL HIGH (ref 0.3–1.2)
Total Protein: 6.8 g/dL (ref 6.5–8.1)

## 2022-01-17 LAB — RESP PANEL BY RT-PCR (FLU A&B, COVID) ARPGX2
Influenza A by PCR: NEGATIVE
Influenza B by PCR: NEGATIVE
SARS Coronavirus 2 by RT PCR: NEGATIVE

## 2022-01-17 LAB — PROTIME-INR
INR: 2.5 — ABNORMAL HIGH (ref 0.8–1.2)
Prothrombin Time: 26.5 seconds — ABNORMAL HIGH (ref 11.4–15.2)

## 2022-01-17 LAB — GLUCOSE, CAPILLARY
Glucose-Capillary: 114 mg/dL — ABNORMAL HIGH (ref 70–99)
Glucose-Capillary: 60 mg/dL — ABNORMAL LOW (ref 70–99)
Glucose-Capillary: 107 mg/dL — ABNORMAL HIGH (ref 70–99)

## 2022-01-17 LAB — CBG MONITORING, ED
Glucose-Capillary: 106 mg/dL — ABNORMAL HIGH (ref 70–99)
Glucose-Capillary: 97 mg/dL (ref 70–99)

## 2022-01-17 LAB — BRAIN NATRIURETIC PEPTIDE: B Natriuretic Peptide: 340.4 pg/mL — ABNORMAL HIGH (ref 0.0–100.0)

## 2022-01-17 LAB — HEMOGLOBIN A1C
Hgb A1c MFr Bld: 6.5 % — ABNORMAL HIGH (ref 4.8–5.6)
Mean Plasma Glucose: 139.85 mg/dL

## 2022-01-17 MED ORDER — HALOPERIDOL LACTATE 5 MG/ML IJ SOLN
1.0000 mg | Freq: Four times a day (QID) | INTRAMUSCULAR | Status: DC | PRN
  Administered 2022-01-17: 1 mg via INTRAVENOUS
  Filled 2022-01-17: qty 1

## 2022-01-17 MED ORDER — LORAZEPAM 2 MG/ML IJ SOLN
0.5000 mg | Freq: Once | INTRAMUSCULAR | Status: AC | PRN
  Administered 2022-01-17: 0.5 mg via INTRAVENOUS
  Filled 2022-01-17: qty 1

## 2022-01-17 MED ORDER — DEXTROSE 50 % IV SOLN: 12.5000 g | INTRAVENOUS | Status: AC

## 2022-01-17 MED ORDER — HALOPERIDOL LACTATE 5 MG/ML IJ SOLN: 1.0000 mg | Freq: Once | INTRAMUSCULAR | Status: DC

## 2022-01-17 MED ORDER — WARFARIN - PHARMACIST DOSING INPATIENT: Freq: Every day | Status: DC

## 2022-01-17 MED ORDER — LOSARTAN POTASSIUM 25 MG PO TABS: 100.0000 mg | ORAL_TABLET | Freq: Every day | ORAL | Status: DC

## 2022-01-17 MED ORDER — DEXTROSE-NACL 5-0.9 % IV SOLN: INTRAVENOUS | Status: DC

## 2022-01-17 MED ORDER — HYDRALAZINE HCL 20 MG/ML IJ SOLN: 10.0000 mg | Freq: Three times a day (TID) | INTRAMUSCULAR | Status: DC | PRN

## 2022-01-17 MED ORDER — WARFARIN SODIUM 5 MG PO TABS
5.0000 mg | ORAL_TABLET | Freq: Once | ORAL | Status: AC
  Filled 2022-01-17: qty 1

## 2022-01-17 MED ORDER — HALOPERIDOL LACTATE 5 MG/ML IJ SOLN
1.0000 mg | Freq: Once | INTRAMUSCULAR | Status: AC | PRN
  Administered 2022-01-17: 1 mg via INTRAVENOUS
  Filled 2022-01-17: qty 1

## 2022-01-17 MED ORDER — METOPROLOL TARTRATE 25 MG PO TABS
25.0000 mg | ORAL_TABLET | Freq: Two times a day (BID) | ORAL | Status: DC
  Administered 2022-01-17 – 2022-01-27 (×18): 25 mg via ORAL
  Filled 2022-01-17 (×20): qty 1

## 2022-01-17 MED ORDER — DEXTROSE 50 % IV SOLN
INTRAVENOUS | Status: AC
  Administered 2022-01-17: 12.5 g via INTRAVENOUS
  Filled 2022-01-17: qty 50

## 2022-01-17 NOTE — ED Notes (Addendum)
Pt has attempted to get out of bed multiple times, kicking and fighting with staff, requiring multiple staff members to bring pt back to bed safety. Pt has been placed in mittens and posey belt. Notified floor coverage Gershon Cull NP.

## 2022-01-17 NOTE — ED Notes (Signed)
Pt took off his mittens and will not let staff but them back on. Pt trying to punch staff.

## 2022-01-17 NOTE — Assessment & Plan Note (Signed)
INR found to be at 7.6, patient was on Coumadin for A-fib. Received vitamin K in ED with improvement of INR to 2.2 this morning. Apparently patient was managing his own medications, questionable over intake due to intermittent confusion. -Coumadin per pharmacy -he will get benefit from switching to [place]

## 2022-01-17 NOTE — Assessment & Plan Note (Signed)
-  Holding home statin due to transaminitis

## 2022-01-17 NOTE — ED Notes (Signed)
Pt continues to attempt to slide under posey belt and get out of bed. PRN Haldol provided.

## 2022-01-17 NOTE — Progress Notes (Signed)
Progress Note   Patient: Albert Short YTK:[id number] DOB: 02/28/1941 DOA: 01/21/2022     0 DOS: the patient was seen and examined on 01/17/2022   Brief hospital course: Taken from H&P.  Albert Short is a 81 y.o. male with medical history significant of CHF, atrial fibrillation, CAD, hyperlipidemia, obesity, diverticulosis, diabetes, GERD, hypertension presenting with generalized decline over the past few months.  He has had few minor falls. Family was also concerned about weight loss in the setting of decreased p.o. intake. He denies fevers, chills, chest pain, shortness of breath, abdominal pain, constipation, diarrhea.   ED course.  Patient had mild tachycardia on presentation which improved with IV fluid. Labs pertinent for metabolic acidosis with bicarb of 18, BUN 54, creatinine at 3 with baseline seems to be around 1.4-1.9.  Mild transaminitis with AST 79, ALT 62, alkaline phosphatase 239, T. bili at 1.9.  Troponin 81>>78, UA with significant hematuria, INR of 7.6.(On Coumadin) Patient received vitamin K in ED along with IV fluid. Chest x-ray with no active disease. 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 CT abdomen and pelvis with some concern of lobular contour to the left upper pole of kidney, No other significant abnormality. Renal ultrasound with lobulated contour of left kidney with no evidence of mass.  11/14: Mildly elevated blood pressure, some improvement in BUN and creatinine at 53/2.73, transaminitis seems stable with slight worsening of T. bili at 2.3.  INR improved to 2.5. Hemoglobin seems stable at 12.5 although UA shows red color and unable to proceed with the whole results.  Repeating UA.  Assessment and Plan: * AKI (acute kidney injury) ([place]) Baseline appears to be around 1.6-1.9 which makes it CKD stage IIIb. Slight  improvement in AKI with creatinine at 2.73 with some IV fluid. Currently a challenge to give him anything IV as he was Pulling on his wires and tubes.  Ordered soft restraint.  Clinically appears dry -Give him some IV fluid -Monitor renal function -Avoid nephrotoxins  Acute metabolic encephalopathy Patient with worsening confusion and agitation.  Most likely underlying dementia, unable to participate with any meaningful history or care.  Head was without any acute abnormality. -Continue with IV fluid -Soft restraint as he was pulling all the wires -Haldol 1 mg IV every 6 hourly as needed.  QTc is within normal limit. -Continue to monitor -Neurochecks  Supratherapeutic INR INR found to be at 7.6, patient was on Coumadin for A-fib. Received vitamin K in ED with improvement of INR to 2.2 this morning. Apparently patient was managing his own medications, questionable over intake due to intermittent confusion. -Coumadin per pharmacy -he will get benefit from switching to DOAC  FTT (failure to thrive) in adult Patient with slowly declining health, progressively worsening p.o. intake for the past few months which got worse over the last 2 weeks when he just get out of bed once.  Intermittent confusion which is also progressively getting worse. No obvious fever or sign of infection. -Try repeating UA -Check COVID and flu -Dietitian consult -Palliative care consult  Protein calorie malnutrition ([place]) Estimated body mass index is 28.18 kg/m as calculated from the following:   Height as of 12/07/21: '5\' 9"'$  (1.753 m).   Weight as of 12/07/21: 86.5 kg.   -Dietitian consult  Intermittent confusion History of intermittent confusion which is progressively getting worse. Seems like having some underlying dementia. -Delirium  precautions  A-fib ([place]) Currently in sinus rhythm. -Continue home metoprolol -Coumadin per pharmacy -Needed discussion regarding switching to a DOAC.  CAD (coronary  artery disease) S/p CABG in 2012.  No current chest pain. Troponin 81 >.78, most likely secondary to demand ischemia. -Getting echocardiogram  Hypertension Blood pressure mildly elevated. -Restarting home metoprolol -Holding home HCTZ and losartan due to AKI -As needed hydralazine  Hypercholesterolemia -Holding home statin due to transaminitis  DM type 2 (diabetes mellitus, type 2) (HCC) No recent A1c on record.  CBG within goal.  He was on metformin at home. -Check A1c -Continue with SSI  Chronic HFrEF (heart failure with reduced ejection fraction) ([place])  Last echo in 2016 with EF 45-50%, apical akinesis due to history of MI in 9311, grade 1 diastolic dysfunction.  Clinically appears dry. -Check BNP -Repeat echocardiogram -Continue home metoprolol -Holding losartan for AKI   Subjective: Patient was seen and examined in ED today.  He was quite agitated and not following any commands.  Appears very lethargic.  Did receive Haldol and Ativan for excessive agitation and pulling on his IVs and tubing.  Physical Exam: Vitals:   01/17/22 0357 01/17/22 0415 01/17/22 0812 01/17/22 1255  BP:  (!) 159/114 (!) 165/74   Pulse:  73 65   Resp:  20 18   Temp: 97.7 F (36.5 C)  (!) 97.5 F (36.4 C) (!) 97.5 F (36.4 C)  TempSrc: Axillary  Axillary Axillary  SpO2:  99% 98%    General.  Chronically ill-appearing, lethargic and agitated elderly man. Pulmonary.  Lungs clear bilaterally, normal respiratory effort. CV.  Regular rate and rhythm, no JVD, rub or murmur. Abdomen.  Soft, nontender, nondistended, BS positive. CNS.  Lethargic, not following any commands Extremities.  No edema, no cyanosis, pulses intact and symmetrical. Psychiatry.  Judgment and insight appears impaired.  Data Reviewed: Prior data reviewed  Family Communication: Discussed with wife on phone.  Disposition: Status is: Inpatient Remains inpatient appropriate because: Severity of illness  Planned Discharge  Destination:  To be determined  DVT prophylaxis.  Coumadin Time spent: 50 minutes  This record has been created using Systems analyst. Errors have been sought and corrected,but may not always be located. Such creation errors do not reflect on the standard of care.   Author: Lorella Nimrod, MD 01/17/2022 1:22 PM  For on call review [email or web address].

## 2022-01-17 NOTE — Progress Notes (Addendum)
ANTICOAGULATION CONSULT NOTE  Pharmacy Consult for warfarin Indication: atrial fibrillation  No Known Allergies  Patient Measurements:    Vital Signs: Temp: 97.5 F (36.4 C) (11/14 0812) Temp Source: Axillary (11/14 0812) BP: 165/74 (11/14 0812) Pulse Rate: 65 (11/14 0812)  Labs: Recent Labs    01/22/2022 1406 01/15/2022 1518 01/17/22 0412  HGB 12.6*  --  12.5*  HCT 40.6  --  39.6  PLT 271  --  242  LABPROT 64.1*  --  26.5*  INR 7.6*  --  2.5*  CREATININE 3.00*  --  2.73*  TROPONINIHS 81* 78*  --     CrCl cannot be calculated (Unknown ideal weight.).  Medications:  (Not in a hospital admission)  Scheduled:   insulin aspart  0-9 Units Subcutaneous TID WC   metoprolol tartrate  25 mg Oral BID   sodium chloride flush  3 mL Intravenous Q12H   PRN: acetaminophen **OR** acetaminophen, polyethylene glycol  Assessment: 72 yoM with PMH Afib on warfarin PTA, CHF, CAD, DM2, HTN, admitted 11/13 for weakness, weight loss, and general decline. Noted to have elevated INR and SCr on admission. Given Vitamin K 10 mg IV x 1, and Pharmacy consulted to dose warfarin once appropriate.  Baseline INR markedly elevated, likely d/t recent poor PO intake/weight loss (down ~16 kg since this time last year) Prior anticoagulation: Med history incomplete; fill history suggests 5 mg daily (5 mg x 90 tabs for 90 day supply, filled 11/1)  Significant events:  Today, 01/17/2022: CBC: Hgb slightly low but stable; Plt stable WNL INR now therapeutic following IV vitamin K Major drug interactions: none; mild interaction with simvastatin, but this is chronic med also No bleeding issues per nursing Eating 5% of meals  Goal of Therapy: INR 2-3  Plan: Warfarin 5 mg PO tonight at 16:00; expect his maintenance dose should be decreased with reduced PO intake and lower weight, but will likely need more aggressive dosing up front to counteract large vitamin K dose Daily INR CBC at least q72 hr while on  warfarin Monitor for signs of bleeding or thrombosis   Reuel Boom, PharmD, BCPS [phone] 01/17/2022, 11:04 AM

## 2022-01-17 NOTE — Assessment & Plan Note (Signed)
Last echo in 2016 with EF 45-50%, apical akinesis due to history of MI in 8367, grade 1 diastolic dysfunction.  Clinically appears dry. -Check BNP -Repeat echocardiogram -Continue home metoprolol -Holding losartan for AKI

## 2022-01-17 NOTE — Assessment & Plan Note (Signed)
Blood pressure mildly elevated. -Restarting home metoprolol -Holding home HCTZ and losartan due to AKI -As needed hydralazine

## 2022-01-17 NOTE — Assessment & Plan Note (Signed)
History of intermittent confusion which is progressively getting worse. Seems like having some underlying dementia. -Delirium precautions

## 2022-01-17 NOTE — Hospital Course (Addendum)
Albert Short is an 81 y.o. M with persAF on warfarin, CAD, dCHF, HLD, and DM who presented with several months progressive generalized decline, weight loss and poor oral intake, memory loss, finally stopped eating and drinking and wouldn't get out of bed for last 2 weeks PTA so family called EMS.  In the ER, appeared dehydrated, Cr 3 (from baseline 1.4-1.9).  INR 7.6 and microscopic hematuria.  CT with lobular contour of left kidney  11/13: Admitted on fluids for AKI, CT and renal US completed 11/14: Cr improving, delirious 11/15: Cr improving, delirium persistent but better 11/16: Cr still up but better, still delirious but able to work with PT 11/17: Cr stabilized, delirious overnight requiring restraints, discharge canceled

## 2022-01-17 NOTE — Assessment & Plan Note (Signed)
Estimated body mass index is 28.18 kg/m as calculated from the following:   Height as of 12/07/21: '5\' 9"'$  (1.753 m).   Weight as of 12/07/21: 86.5 kg.   -Dietitian consult

## 2022-01-17 NOTE — ED Notes (Signed)
Provided report to Air cabin crew.

## 2022-01-17 NOTE — ED Notes (Signed)
Pt has been repeatedly pulling on IV tubing, IV and not keeping arm straight after repeat redirection. Unable to keep maintenance fluids running at this time.

## 2022-01-17 NOTE — Assessment & Plan Note (Signed)
No recent A1c on record.  CBG within goal.  He was on metformin at home. -Check A1c -Continue with SSI

## 2022-01-17 NOTE — Assessment & Plan Note (Signed)
S/p CABG in 2012.  No current chest pain. Troponin 81 >.78, most likely secondary to demand ischemia. -Getting echocardiogram

## 2022-01-17 NOTE — Progress Notes (Signed)
OT Cancellation Note  Patient Details Name: Albert Short MRN: [id number] DOB: 01-01-1941   Cancelled Treatment:    Reason Eval/Treat Not Completed: Patient not medically ready Patient was noted to be restless in bed with sitter in room. OT to continue to follow and check back as schedule will allow Rennie Plowman, MS Acute Rehabilitation Department Office# [phone]  01/17/2022, 12:03 PM

## 2022-01-17 NOTE — ED Notes (Signed)
Pt has been repeatedly pulling out IV's at lines, will attempt another IV when pt calms down.

## 2022-01-17 NOTE — ED Notes (Signed)
Pt continues to remove posey belt.

## 2022-01-17 NOTE — Assessment & Plan Note (Signed)
Patient with worsening confusion and agitation.  Most likely underlying dementia, unable to participate with any meaningful history or care.  Head was without any acute abnormality. -Continue with IV fluid -Soft restraint as he was pulling all the wires -Haldol 1 mg IV every 6 hourly as needed.  QTc is within normal limit. -Continue to monitor -Neurochecks

## 2022-01-17 NOTE — Assessment & Plan Note (Signed)
Baseline appears to be around 1.6-1.9 which makes it CKD stage IIIb. Slight improvement in AKI with creatinine at 2.73 with some IV fluid. Currently a challenge to give him anything IV as he was Pulling on his wires and tubes.  Ordered soft restraint.  Clinically appears dry -Give him some IV fluid -Monitor renal function -Avoid nephrotoxins

## 2022-01-17 NOTE — Assessment & Plan Note (Signed)
Currently in sinus rhythm. -Continue home metoprolol -Coumadin per pharmacy -Needed discussion regarding switching to a DOAC.

## 2022-01-17 NOTE — Assessment & Plan Note (Signed)
Patient with slowly declining health, progressively worsening p.o. intake for the past few months which got worse over the last 2 weeks when he just get out of bed once.  Intermittent confusion which is also progressively getting worse. No obvious fever or sign of infection. -Try repeating UA -Check COVID and flu -Dietitian consult -Palliative care consult

## 2022-01-17 NOTE — Progress Notes (Signed)
  PT Cancellation Note  Patient Details Name: Albert Short MRN: [id number] DOB: April 16, 1940   Cancelled Treatment:    Reason Eval/Treat Not Completed: Other (comment)  Patient restless, requiring sitter, will check back when more able to participate. [place] Office [phone] Weekend [phone] Claretha Cooper 01/17/2022, 10:23 AM

## 2022-01-18 ENCOUNTER — Inpatient Hospital Stay (HOSPITAL_COMMUNITY): Payer: Medicare HMO

## 2022-01-18 ENCOUNTER — Other Ambulatory Visit: Payer: Self-pay

## 2022-01-18 DIAGNOSIS — N179 Acute kidney failure, unspecified: Secondary | ICD-10-CM | POA: Diagnosis not present

## 2022-01-18 DIAGNOSIS — I2510 Atherosclerotic heart disease of native coronary artery without angina pectoris: Secondary | ICD-10-CM | POA: Diagnosis not present

## 2022-01-18 DIAGNOSIS — R627 Adult failure to thrive: Secondary | ICD-10-CM | POA: Diagnosis not present

## 2022-01-18 DIAGNOSIS — G9341 Metabolic encephalopathy: Secondary | ICD-10-CM | POA: Diagnosis not present

## 2022-01-18 DIAGNOSIS — F0390 Unspecified dementia without behavioral disturbance: Secondary | ICD-10-CM | POA: Insufficient documentation

## 2022-01-18 DIAGNOSIS — I5022 Chronic systolic (congestive) heart failure: Secondary | ICD-10-CM

## 2022-01-18 DIAGNOSIS — E43 Unspecified severe protein-calorie malnutrition: Secondary | ICD-10-CM | POA: Insufficient documentation

## 2022-01-18 DIAGNOSIS — N1832 Chronic kidney disease, stage 3b: Secondary | ICD-10-CM

## 2022-01-18 LAB — URINALYSIS, ROUTINE W REFLEX MICROSCOPIC
Bilirubin Urine: NEGATIVE
Glucose, UA: NEGATIVE mg/dL
Ketones, ur: NEGATIVE mg/dL
Nitrite: NEGATIVE
Protein, ur: 100 mg/dL — AB
RBC / HPF: >50 RBC/hpf — ABNORMAL HIGH (ref 0–5)
Specific Gravity, Urine: 1.017 (ref 1.005–1.030)
pH: 5 (ref 5.0–8.0)

## 2022-01-18 LAB — COMPREHENSIVE METABOLIC PANEL
ALT: 63 U/L — ABNORMAL HIGH (ref 0–44)
AST: 55 U/L — ABNORMAL HIGH (ref 15–41)
Albumin: 3.2 g/dL — ABNORMAL LOW (ref 3.5–5.0)
Alkaline Phosphatase: 209 U/L — ABNORMAL HIGH (ref 38–126)
Anion gap: 9 (ref 5–15)
BUN: 43 mg/dL — ABNORMAL HIGH (ref 8–23)
CO2: 22 mmol/L (ref 22–32)
Calcium: 8.8 mg/dL — ABNORMAL LOW (ref 8.9–10.3)
Chloride: 109 mmol/L (ref 98–111)
Creatinine, Ser: 2.13 mg/dL — ABNORMAL HIGH (ref 0.61–1.24)
GFR, Estimated: 30 mL/min — ABNORMAL LOW (ref >60)
Glucose, Bld: 137 mg/dL — ABNORMAL HIGH (ref 70–99)
Potassium: 3.3 mmol/L — ABNORMAL LOW (ref 3.5–5.1)
Sodium: 140 mmol/L (ref 135–145)
Total Bilirubin: 2.2 mg/dL — ABNORMAL HIGH (ref 0.3–1.2)
Total Protein: 6.6 g/dL (ref 6.5–8.1)

## 2022-01-18 LAB — CBC
HCT: 40.8 % (ref 39.0–52.0)
Hemoglobin: 13.2 g/dL (ref 13.0–17.0)
MCH: 26.8 pg (ref 26.0–34.0)
MCHC: 32.4 g/dL (ref 30.0–36.0)
MCV: 82.8 fL (ref 80.0–100.0)
Platelets: 231 10*3/uL (ref 150–400)
RBC: 4.93 MIL/uL (ref 4.22–5.81)
RDW: 16.6 % — ABNORMAL HIGH (ref 11.5–15.5)
WBC: 7.2 10*3/uL (ref 4.0–10.5)
nRBC: 0 % (ref 0.0–0.2)

## 2022-01-18 LAB — PROTIME-INR
INR: 1.6 — ABNORMAL HIGH (ref 0.8–1.2)
Prothrombin Time: 19.2 seconds — ABNORMAL HIGH (ref 11.4–15.2)

## 2022-01-18 LAB — GLUCOSE, CAPILLARY
Glucose-Capillary: 161 mg/dL — ABNORMAL HIGH (ref 70–99)
Glucose-Capillary: 124 mg/dL — ABNORMAL HIGH (ref 70–99)
Glucose-Capillary: 160 mg/dL — ABNORMAL HIGH (ref 70–99)

## 2022-01-18 MED ORDER — KCL IN DEXTROSE-NACL 20-5-0.9 MEQ/L-%-% IV SOLN
INTRAVENOUS | Status: DC
  Filled 2022-01-18 (×2): qty 1000

## 2022-01-18 MED ORDER — WARFARIN SODIUM 5 MG PO TABS
7.5000 mg | ORAL_TABLET | Freq: Once | ORAL | Status: AC
  Administered 2022-01-18: 7.5 mg via ORAL
  Filled 2022-01-18: qty 1

## 2022-01-18 MED ORDER — LACTATED RINGERS IV BOLUS
1000.0000 mL | Freq: Once | INTRAVENOUS | Status: AC
  Administered 2022-01-18: 1000 mL via INTRAVENOUS

## 2022-01-18 MED ORDER — SODIUM CHLORIDE 0.9 % IV BOLUS
1000.0000 mL | Freq: Once | INTRAVENOUS | Status: AC
  Administered 2022-01-18: 1000 mL via INTRAVENOUS

## 2022-01-18 MED ORDER — ENSURE ENLIVE PO LIQD
237.0000 mL | Freq: Two times a day (BID) | ORAL | Status: DC
  Administered 2022-01-18 – 2022-01-27 (×9): 237 mL via ORAL

## 2022-01-18 MED ORDER — ADULT MULTIVITAMIN W/MINERALS CH
1.0000 | ORAL_TABLET | Freq: Every day | ORAL | Status: DC
  Administered 2022-01-19 – 2022-01-24 (×5): 1 via ORAL
  Filled 2022-01-18 (×8): qty 1

## 2022-01-18 NOTE — Assessment & Plan Note (Addendum)
Patient is prescribed simvastatin but noted to be discontinued.

## 2022-01-18 NOTE — Progress Notes (Signed)
PT Cancellation Note  Patient Details Name: Albert Short MRN: [id number] DOB: Aug 14, 1940   Cancelled Treatment:    Reason Eval/Treat Not Completed: Patient's level of consciousness Was  poorly arousable from meds. [place] Office [phone] Weekend [phone]  Claretha Cooper 01/18/2022, 11:53 AM

## 2022-01-18 NOTE — Assessment & Plan Note (Addendum)
Baseline creatinine is about 1.4-1.9 from most recent labs. Creatinine of 3.00 on admission and has improved with IV fluids down to 1.82. Slight uptrend. IV fluids held. -BMP in AM

## 2022-01-18 NOTE — Plan of Care (Signed)
Patient lying in bed, aware of need to urinate with primafit in place.  Unable to manipulate urninal effectively. Appears fatigued and spends majority of time lying with eyes closed.  Restless when awake, responds appropriately to requests, able to speak clearly.

## 2022-01-18 NOTE — Assessment & Plan Note (Addendum)
History of CABG. Patient now with chest pain without dyspnea. Troponin up to 181 today. It is possible this could be related to heart failure after diuresis. Recent Transthoracic Echocardiogram was of poor quality secondary to uncooperative patient and respiratory motion. Episode of chest pain not consistent with ACS. Possible angina as patient has history of nitroglycerin use at home.

## 2022-01-18 NOTE — Assessment & Plan Note (Addendum)
Unclear etiology. No formal diagnosis of dementia, but patient has a history of progressive memory loss. Patient started on Seroquel while inpatient. -Continue Seroquel for now; decrease to Seroquel 12.5 mg but increase frequency to BID -Delirium precautions -Address pain, toilet needs, family at bedside when able, etc.

## 2022-01-18 NOTE — Assessment & Plan Note (Addendum)
Dietitian recommendations (11/15): Continue regular diet as tolerated Provide daily MVI for taste changes and poor appetite Provide Ensure Enlive or Ensure Plus HP BID (350kcal, 20g protein/bottle) Provide meal preferences and assist with meal setup to optimize po intake

## 2022-01-18 NOTE — TOC Initial Note (Signed)
Transition of Care [facility]) - Initial/Assessment Note    Patient Details  Name: Albert Short MRN: [id number] Date of Birth: 1940/10/17  Transition of Care [facility]) CM/SW Contact:    Leeroy Cha, RN Phone Number: 01/18/2022, 9:47 AM  Clinical Narrative:                  Transition of Care [facility]) Screening Note   Patient Details  Name: Albert Short Date of Birth: Jun 10, 1940   [facility] of Care [facility]) CM/SW Contact:    Leeroy Cha, RN Phone Number: 01/18/2022, 9:47 AM    Transition of Care Department [facility]) has reviewed patient and no TOC needs have been identified at this time. We will continue to monitor patient advancement through interdisciplinary progression rounds. If new patient transition needs arise, please place a TOC consult.    Expected Discharge Plan: Home/Self Care Barriers to Discharge: Continued Medical Work up   Patient Goals and CMS Choice Patient states their goals for this hospitalization and ongoing recovery are:: to get better CMS Medicare.gov Compare Post Acute Care list provided to:: Patient    Expected Discharge Plan and Services Expected Discharge Plan: Home/Self Care   Discharge Planning Services: CM Consult   Living arrangements for the past 2 months: Single Family Home                                      Prior Living Arrangements/Services Living arrangements for the past 2 months: Single Family Home Lives with:: Spouse Patient language and need for interpreter reviewed:: Yes Do you feel safe going back to the place where you live?: Yes            Criminal Activity/Legal Involvement Pertinent to Current Situation/Hospitalization: No - Comment as needed  Activities of Daily Living Home Assistive Devices/Equipment: Walker (specify type), Cane (specify quad or straight), CBG Meter ADL Screening (condition at time of admission) Patient's cognitive ability adequate to safely complete daily activities?: No Is  the patient deaf or have difficulty hearing?: No Does the patient have difficulty seeing, even when wearing glasses/contacts?: No Does the patient have difficulty concentrating, remembering, or making decisions?: Yes Patient able to express need for assistance with ADLs?: Yes Does the patient have difficulty dressing or bathing?: No Independently performs ADLs?: No Communication: Independent Dressing (OT): Independent Grooming: Independent Feeding: Independent Bathing: Independent Toileting: Needs assistance In/Out Bed: Needs assistance Is this a change from baseline?: Pre-admission baseline Walks in Home: Needs assistance Is this a change from baseline?: Pre-admission baseline Does the patient have difficulty walking or climbing stairs?: Yes Weakness of Legs: Both Weakness of Arms/Hands: Both  Permission Sought/Granted                  Emotional Assessment Appearance:: Appears stated age Attitude/Demeanor/Rapport: Engaged Affect (typically observed): Calm Orientation: : Oriented to Place, Oriented to Self, Oriented to Situation, Oriented to  Time Alcohol / Substance Use: Not Applicable Psych Involvement: No (comment)  Admission diagnosis:  Transaminitis [R74.01] AKI (acute kidney injury) ([place]) [N17.9] Supratherapeutic INR [R79.1] Patient Active Problem List   Diagnosis Date Noted   Acute metabolic encephalopathy 58/85/0277   Intermittent confusion 01/15/2022   Protein calorie malnutrition ([place]) 01/17/2022   FTT (failure to thrive) in adult 01/21/2022   Supratherapeutic INR 02/02/2022   Hypertension 05/21/2019   Hyponatremia 11/23/2014   AKI (acute kidney injury) ([place]) 11/23/2014   UTI (lower urinary  tract infection) 11/23/2014   Chronic HFrEF (heart failure with reduced ejection fraction) ([place]) 11/23/2014   Chest pain 11/22/2014   A-fib ([place])    Old myocardial infarct    Hypercholesterolemia    Obesity    Diverticulosis    GERD (gastroesophageal reflux  disease)    CAD (coronary artery disease)    DM type 2 (diabetes mellitus, type 2) ([place])    PCP:  Leeroy Cha, MD Pharmacy:   [id number] [place], [place] [address]Phone: [phone]: [phone] [facility] [address] [place]Phone: [phone]: [phone]    Social Determinants of Health (SDOH) Interventions    Readmission Risk Interventions   No data to display

## 2022-01-18 NOTE — Progress Notes (Signed)
Progress Note   Patient: Albert Short GYI:[id number] DOB: Feb 09, 1941 DOA: 01/15/2022     1 DOS: the patient was seen and examined on 01/18/2022 at 11:30AM      Brief hospital course: Mr. Mella is an 81 y.o. M with persAF on warfarin, CAD, dCHF, HLD, and DM who presented with several months progressive generalized decline, weight loss and poor oral intake, memory loss, finally stopped eating and drinking and wouldn't get out of bed for last 2 weeks PTA so family called EMS.  In the ER, appeared dehydrated, Cr 3 (from baseline 1.4-1.9).  INR 7.6 and microscopic hematuria.  CT with lobular contour of left kidney  11/13: Admitted on fluids for AKI, CT and renal US completed 11/14: Cr improving, delirious 11/15: Cr improving, delirium persistent but better     Assessment and Plan: * Acute renal failure superimposed on stage 3b chronic kidney disease (HCC) Cr 3.0 > 2.73 > 2.13 today with fluids Baseline 1.4-1.9 UA was grossly bloody in setting of INR >7, US renal showed lobular contour of left kidney without mass or hydronephrosis.  No renal electrolytes done.  Given improvement with fluids, appears ischemic injury - Continue IV fluids - Trend Cr    Acute metabolic encephalopathy Family report consistent prior memory loss over months or longer.  No prior diagnosis of dementia. Here, has delirium - Standard delirium precautions: blinds open and lights on during day, TV off, minimize interruptions at night, glasses/hearing aids, PT/OT, avoiding [place] list medications  - Treat renal failure  FTT (failure to thrive) in adult Slow progressive decline over months, worse in the last 2 weeks. Hematuria and weight loss raise concern for cancer.  Likely dementia    Protein-calorie malnutrition, severe As evidenced by poor PO intake, weight loss of 16% in 5 months, and severe diffuse loss of subcutaneous muscle mass and fat.  Supratherapeutic INR Received vitamin K in ER due  to hematuria.  Now INR down to normal range. - Continue wrfarin per pharmacy   Essential hypertension BP controlled - Continue metoprolol - Hold lisinopril, amlodipine, clonidine, HCTZ  Chronic systolic CHF (congestive heart failure) (HCC) Appears dry still - Hold losartan - Continue metoprolol   DM type 2 (diabetes mellitus, type 2) (HCC) A1c 6.5%, glucose controlled here - Continue SS corrections    CAD (coronary artery disease) S/p CABG in 2012.  No chest pain or ECG changes, ischemia ruled out, troponin elevation is due to renal failure and poor clearance only. - Follow echo  Hypercholesterolemia - Hold statin given LFTs  Paroxysmal atrial fibrillation (HCC) Remains in sinus. - Continue metoprolol - Continue warfarin per pharmacy            Subjective: patient is delirious, doesn't remember how he got to the hospital, disagrees with me abuot how he got there; no fever, dyspnea, pain, swelling     Physical Exam: BP 128/77 (BP Location: Right Arm)   Pulse 92   Temp 97.6 F (36.4 C) (Oral)   Resp 19   Wt 79.8 kg   SpO2 99%   BMI 25.98 kg/m   Elderly adult male, lying in bed, weak and tired, arouses but is sleepy RRR, no murmurs, no peripheral edema, no JVD Respiratory rate normal, lungs clear without rales or wheezes Abdomen soft without tenderness palpation or guarding, no ascites or distention Responds to questions, makes eye contact, follows some simple commands, but is disoriented, oriented only to self, has a tangential train of thought and thought content  is unclear and rambling, moves upper extremities with generalized weakness but symmetric strength   Data Reviewed: Discussed with urology by phone Basic metabolic panel shows mild hypokalemia, creatinine down to 2.1 Transaminitis improving Total bilirubin down to 2.2 Complete blood count normal  Family Communication: wife by phone    Disposition: Status is: Inpatient The patient was  admitted with renal failure and delirium  His renal failure is improving, but creatinine still greater than 2  We will need to continue IV fluids, monitor his renal function, and hopefully his mentation will improve and he will be able to discharge to rehab by the end of the week        Author: Edwin Dada, MD 01/18/2022 4:30 PM  For on call review [email or web address].

## 2022-01-18 NOTE — Progress Notes (Signed)
Echo attempted, patient is pleasantly confused and very sleepy. He is removing leads and probe from his chest and keeps rolling side to side trying to get comfortable to take a nap. Will reattempt echo as schedule permits.  Albert Short

## 2022-01-18 NOTE — Consult Note (Signed)
Consultation Note Date: 01/18/2022   Patient Name: Albert Short  DOB: [birth year]  MRN: [id number]  Age / Sex: 81 y.o., male  PCP: Leeroy Cha, MD Referring Physician: Edwin Dada, *  Reason for Consultation: Establishing goals of care  HPI/Patient Profile: 81 y.o. male   admitted on 01/15/2022   Clinical Assessment and Goals of Care: 81 year old gentleman who lives at home with his wife, daughter is currently present at the bedside, has past medical history of CHF atrial fibrillation CAD dyslipidemia diverticulosis diabetes GERD and hypertension.  Patient presented with generalized decline and falls and weight loss decreased oral intake, admitted with acute kidney injury, currently admitted to hospital medicine service, hospital course complicated by episodic delirium. Palliative medicine has been consulted for ongoing goals of care discussions. Palliative consult request received Patient seen and examined, discussed with her daughter present at bedside, safety sitter was also present in the room, advance care planning documents are in the app, noted presence of MOST form delineating his wishes Palliative medicine is specialized medical care for people living with serious illness. It focuses on providing relief from the symptoms and stress of a serious illness. The goal is to improve quality of life for both the patient and the family. Goals of care: Broad aims of medical therapy in relation to the patient's values and preferences. Our aim is to provide medical care aimed at enabling patients to achieve the goals that matter most to them, given the circumstances of their particular medical situation and their constraints.    NEXT OF KIN  Wife and daughter.    SUMMARY OF RECOMMENDATIONS   Full code/full scope care as per patient wishes as well as as per his advance care planning  documents. Recommend home health care, recommend outpatient palliative care  Non-pharmacologic Delirium Precautions  - Frequent re-orientation; update board w/ date, names of treatment team  - Daytime stimulation: Open curtains, turn lights on, and turn on television as tolerated during waking hours  - Nighttime calm - close curtains, turn lights off, and turn off television at 9pm  with minimal interruptions from treatment team during sleeping hours, including avoiding lab draws if possible  - Encourage continued presence of family/friends  - Occupy w/ distractions - e.g. ask them to fold washcloths, busy-board  - Encourage movement with PT/OT as tolerated  - Ensure adequate sensorium, to include providing glasses, dentures, and hearing aids to patient as appropriate  - Ensure adequate nutrition and fluid intake  - Avoid restraints whenever possible, but "safety first" . If restraints are necessary, please monitor CPK and BUN/Cr  - Assess and treat pain (incl nonverbal cues); e.g. consider scheduled tylenol  - Assess and treat constipation and urinary retention  - Ensure hydration, electrolyte balance (K to 4.0, Mg to 2.0), adequate oxygenation  - Review medications (addition, deletion, changes can trigger)  - If possible, avoid "high-risk" meds such as anticholinergics, BZ, steroids, antidepressants    Code Status/Advance Care Planning: Full code   Symptom Management:  Palliative Prophylaxis:  Delirium Protocol  Additional Recommendations (Limitations, Scope, Preferences): Full Scope Treatment  Psycho-social/Spiritual:  Desire for further Chaplaincy support:yes Additional Recommendations: Caregiving  Support/Resources  Prognosis:  Unable to determine  Discharge Planning: Home with Home Health      Primary Diagnoses: Present on Admission:  CAD (coronary artery disease)  Hypertension  Hypercholesterolemia  A-fib (HCC)  Chronic HFrEF (heart failure with reduced  ejection fraction) (HCC)  AKI (acute kidney injury) ([place])  Intermittent confusion  Protein calorie malnutrition (HCC)  FTT (failure to thrive) in adult  Supratherapeutic INR   I have reviewed the medical record, interviewed the patient and family, and examined the patient. The following aspects are pertinent.  Past Medical History:  Diagnosis Date   A-fib [facility])    resolved 2006   CAD (coronary artery disease)    Diverticulosis    DM type 2 (diabetes mellitus, type 2) (HCC)    GERD (gastroesophageal reflux disease)    Hypercholesterolemia    MI (myocardial infarction) ([place]) 04/1986   preserved OV systolic function with apical akinesis    Obesity    Social History   Socioeconomic History   Marital status: Married    Spouse name: Not on file   Number of children: 2   Years of education: Not on file   Highest education level: Not on file  Occupational History   Occupation: truck driver, retired  Tobacco Use   Smoking status: Former    Types: Cigarettes    Quit date: 04/06/1986    Years since quitting: 35.8   Smokeless tobacco: Never  Vaping Use   Vaping Use: Never used  Substance and Sexual Activity   Alcohol use: No    Comment: d/c'ed with MI in 1988   Drug use: No   Sexual activity: Not on file  Other Topics Concern   Not on file  Social History Narrative   Not on file   Social Determinants of Health   Financial Resource Strain: Not on file  Food Insecurity: No Food Insecurity (01/05/2022)   Hunger Vital Sign    Worried About Running Out of Food in the Last Year: Never true    Ran Out of Food in the Last Year: Never true  Transportation Needs: No Transportation Needs (01/27/2022)   PRAPARE - Hydrologist (Medical): No    Lack of Transportation (Non-Medical): No  Physical Activity: Not on file  Stress: Not on file  Social Connections: Not on file   Family History  Problem Relation Age of Onset   Heart disease Brother     Diabetes Mother    Alcoholism Father    Heart disease Paternal Grandfather    Scheduled Meds:  feeding supplement  237 mL Oral BID BM   insulin aspart  0-9 Units Subcutaneous TID WC   metoprolol tartrate  25 mg Oral BID   multivitamin with minerals  1 tablet Oral Daily   sodium chloride flush  3 mL Intravenous Q12H   warfarin  5 mg Oral ONCE-1600   Warfarin - Pharmacist Dosing Inpatient   Does not apply q1600   Continuous Infusions:  dextrose 5 % and 0.9 % NaCl with KCl 20 mEq/L 75 mL/hr at 01/18/22 1029   PRN Meds:.acetaminophen **OR** acetaminophen, haloperidol lactate, hydrALAZINE, polyethylene glycol Medications Prior to Admission:  Prior to Admission medications   Medication Sig Start Date End Date Taking? Authorizing Provider  acetaminophen (TYLENOL) 500 MG tablet Take 500-1,000 mg by mouth every  6 (six) hours as needed for mild pain or headache.   Yes [provider]  amLODipine (NORVASC) 10 MG tablet Take 10 mg by mouth in the morning.   Yes [provider]  Cholecalciferol (VITAMIN D3 SUPER STRENGTH) 50 MCG (2000 UT) CAPS Take 2,000 Units by mouth daily.   Yes [provider]  cloNIDine (CATAPRES) 0.1 MG tablet Take 0.1 mg by mouth at bedtime.   Yes [provider]  clotrimazole (LOTRIMIN) 1 % cream Apply 1 Application topically 2 (two) times daily as needed (to affected areas of the feet- for irritation).   Yes [provider]  docusate sodium (COLACE) 100 MG capsule Take 100 mg by mouth daily.   Yes [provider]  fluticasone (FLONASE) 50 MCG/ACT nasal spray Place 1-2 sprays into both nostrils daily as needed for allergies or rhinitis.   Yes [provider]  hydrochlorothiazide (HYDRODIURIL) 25 MG tablet Take 0.5 tablets (12.5 mg total) by mouth daily. Patient taking differently: Take 12.5 mg by mouth in the morning. 12/07/21  Yes Belva Crome, MD  loratadine (CLARITIN) 10 MG tablet Take 10 mg by mouth daily as  needed for allergies or rhinitis.   Yes [provider]  losartan (COZAAR) 100 MG tablet Take 100 mg by mouth every evening. 09/11/13  Yes [provider]  metFORMIN (GLUCOPHAGE) 500 MG tablet Take 500 mg by mouth 2 (two) times daily with a meal.   Yes [provider]  metoprolol tartrate (LOPRESSOR) 25 MG tablet Take 1 tablet (25 mg total) by mouth 2 (two) times daily. Please make appt with Dr. Tamala Julian for October before anymore refills. 1st attempt Patient taking differently: Take 25 mg by mouth 2 (two) times daily with a meal. 09/17/17  Yes Belva Crome, MD  traMADol (ULTRAM) 50 MG tablet Take 50 mg by mouth every 6 (six) hours as needed (for pain). 09/19/13  Yes [provider]  warfarin (COUMADIN) 5 MG tablet Take 5 mg by mouth daily at 6 PM.   Yes [provider]  Cholecalciferol (VITAMIN D3) 1000 UNITS CAPS Take 1 capsule (1,000 Units total) by mouth 2 (two) times daily. Patient not taking: Reported on 01/17/2022 11/18/14   Belva Crome, MD  nitroGLYCERIN (NITROSTAT) 0.4 MG SL tablet Place 0.4 mg under the tongue every 5 (five) minutes x 3 doses as needed for chest pain.    [provider]  simvastatin (ZOCOR) 40 MG tablet Take 40 mg by mouth daily. Patient not taking: Reported on 01/17/2022 12/04/14   [provider]   No Known Allergies Review of Systems Denies pain Physical Exam Awake alert oriented, sitting in chair Regular work of breathing Some edema  Vital Signs: BP 128/77 (BP Location: Right Arm)   Pulse 92   Temp 97.6 F (36.4 C) (Oral)   Resp 19   Wt 79.8 kg   SpO2 99%   BMI 25.98 kg/m  Pain Scale: 0-10   Pain Score: 0-No pain   SpO2: SpO2: 99 % O2 Device:SpO2: 99 % O2 Flow Rate: .   IO: Intake/output summary:  Intake/Output Summary (Last 24 hours) at 01/18/2022 1337 Last data filed at 01/18/2022 1248 Gross per 24 hour  Intake 240 ml  Output 800 ml  Net -560 ml    LBM: Last BM Date :  01/18/22 Baseline Weight: Weight: 79.8 kg Most recent weight: Weight: 79.8 kg     Palliative Assessment/Data:   PPS 50%  Time In:  1330  Time Out:  1430 Time Total:  60  Greater than 50%  of this time was spent counseling and coordinating care related to the above assessment and plan.  Signed by: Loistine Chance, MD   Please contact Palliative Medicine Team phone at [phone] for questions and concerns.  For individual provider: See Shea Evans

## 2022-01-18 NOTE — Progress Notes (Signed)
Able to remove mitts/restraints all day today without difficulty.

## 2022-01-18 NOTE — Progress Notes (Signed)
ANTICOAGULATION CONSULT NOTE  Pharmacy Consult for warfarin Indication: atrial fibrillation  No Known Allergies  Patient Measurements: Weight: 79.8 kg (175 lb 14.8 oz)  Vital Signs: Temp: 97.8 F (36.6 C) (11/15 0509) Temp Source: Axillary (11/15 0300) BP: 147/83 (11/15 0509) Pulse Rate: 81 (11/15 0509)  Labs: Recent Labs    01/15/2022 1406 01/25/2022 1518 01/17/22 0412 01/18/22 0334  HGB 12.6*  --  12.5* 13.2  HCT 40.6  --  39.6 40.8  PLT 271  --  242 231  LABPROT 64.1*  --  26.5* 19.2*  INR 7.6*  --  2.5* 1.6*  CREATININE 3.00*  --  2.73* 2.13*  TROPONINIHS 81* 78*  --   --      Estimated Creatinine Clearance: 26.7 mL/min (A) (by C-G formula based on SCr of 2.13 mg/dL (H)).   Assessment: 72 yoM with PMH Afib on warfarin PTA, CHF, CAD, DM2, HTN, admitted 11/13 for weakness, weight loss, and general decline. Noted to have elevated INR and SCr on admission. Given Vitamin K 10 mg IV x 1, and Pharmacy consulted to dose warfarin once appropriate.  Baseline INR markedly elevated, likely d/t recent poor PO intake/weight loss (down ~16 kg since this time last year) Prior anticoagulation: Med history incomplete; fill history suggests 5 mg daily (5 mg x 90 tabs for 90 day supply, filled 11/1)  Significant events: - 11/14: warfarin dose not given  Today, 01/18/2022: INR now SUBtherapeutic following IV vitamin K and missed dose 11/14 CBC: Hgb stable; Plt stable WNL Major drug interactions: none; mild interaction with simvastatin, but this is chronic med also No bleeding issues per nursing  Goal of Therapy: INR 2-3  Plan: Warfarin 7.5 mg PO tonight at 16:00; expect his maintenance dose should be decreased with reduced PO intake and lower weight, but will likely need more aggressive dosing up front to counteract large vitamin K dose Daily INR CBC at least q72 hr while on warfarin Monitor for signs of bleeding or thrombosis   Peggyann Juba, PharmD, BCPS Pharmacy:  [phone] 01/18/2022, 8:49 AM

## 2022-01-18 NOTE — Assessment & Plan Note (Addendum)
Patient treated with vitamin K on initial presentation secondary to associated hematuria. INR improved but has now trended back up. No evidence of concurrent hemorrhage at this time. Hemoglobin stable. Vitamin K 2.5 mg given on 11/20.  -Discontinue Coumadin now that plan is for transfer to hospice facility

## 2022-01-18 NOTE — Plan of Care (Incomplete)
Patient has been sleeping, occasionally restless, difficulty with blood draw in am, reacts to touch and hold.

## 2022-01-18 NOTE — Plan of Care (Signed)
  Problem: Coping: Goal: Ability to adjust to condition or change in health will improve Outcome: Progressing   Problem: Fluid Volume: Goal: Ability to maintain a balanced intake and output will improve Outcome: Progressing   Problem: Nutritional: Goal: Maintenance of adequate nutrition will improve Outcome: Progressing Goal: Progress toward achieving an optimal weight will improve Outcome: Progressing   Problem: Skin Integrity: Goal: Risk for impaired skin integrity will decrease Outcome: Progressing

## 2022-01-18 NOTE — Assessment & Plan Note (Addendum)
Remains in sinus. INR therapeutic - Continue metoprolol - Continue warfarin per pharmacy

## 2022-01-18 NOTE — Assessment & Plan Note (Addendum)
Hemoglobin A1C of 6.5%. Patient is managed on metformin as an outpatient. -Continue SSI

## 2022-01-18 NOTE — Progress Notes (Signed)
OT Cancellation Note  Patient Details Name: Albert Short MRN: [id number] DOB: [birth year]   Cancelled Treatment:    Reason Eval/Treat Not Completed: Patient not medically ready Nursing asking for therapy to hold off at this time. OT to continue to follow and check back as schedule will allow.  Rennie Plowman, MS Acute Rehabilitation Department Office# [phone]  01/18/2022, 12:29 PM

## 2022-01-18 NOTE — Assessment & Plan Note (Addendum)
Some uncontrolled hypertension. Patient is on metoprolol, amlodipine, clonidine, hydrochlorothiazide  and losartan as an outpatient. Losartan, hydrochlorothiazide, amlodipine and clonidine held. -Continue metoprolol -Start amlodipine 5 mg daily

## 2022-01-18 NOTE — Assessment & Plan Note (Addendum)
Stable. Losartan held secondary to AKI. -Continue metoprolol

## 2022-01-18 NOTE — Assessment & Plan Note (Addendum)
Slow progressive decline over months, worse in the last 2 weeks. Now not eating and deteriorated to the point he couldn't stand at home. Last cardiology note says "Having significant memory issues" Hematuria and weight loss raise concern for cancer. If not cancer, suspect this is progrsesion of dementia. - Plan for CT abdomen and pelvis with contrast tomorrow - Recommend Palliative to follow at [facility]

## 2022-01-18 NOTE — Progress Notes (Signed)
Initial Nutrition Assessment  DOCUMENTATION CODES:   Severe malnutrition in context of chronic illness  INTERVENTION:  -Continue regular diet as tolerated -Provide daily MVI for taste changes and poor appetite -Provide Ensure Enlive or Ensure Plus HP BID (350kcal, 20g protein/bottle) -Provide meal preferences and assist with meal setup to optimize po intake  NUTRITION DIAGNOSIS:  Severe Malnutrition related to chronic illness (ongoing confusion and lethargy) as evidenced by percent weight loss, severe fat depletion, severe muscle depletion.  GOAL:  Patient will meet greater than or equal to 90% of their needs  MONITOR:  PO intake, Supplement acceptance  REASON FOR ASSESSMENT:  Consult Assessment of nutrition requirement/status  ASSESSMENT:  Pt is an 81yo M with PMH of CHF, a-fib, CAD, HLD, obesity, diverticulosis, DM, GERD, and HTN who presents with generalized decline.   Visited pt at bedside during lunch with family present. Pt's appetite had improved and per reports pt ate 100% of meal. Family reports pt's appetite has been declining over the last several months. He's had significant weight loss that they've been tracking. Chart review shows a significant 16.2% weight loss in the last 5 months. RD returned after lunch for NFPE. Pt was sleeping, woke to exam and allowed upper body exam but would not allow RD to examine LE. Exam reveals severe muscle wasting and severe fat loss. Pt meets ASPEN criteria for severe protein calorie malnutrition r/t chronic fatigue, and confusion.  Weight History: 01/18/22 79.8 kg  12/07/21 86.5 kg  08/29/21 95.3 kg  10/06/20 102.5 kg  05/05/19 109.3 kg  12/26/17 110.9 kg  12/08/16 115.1 kg   Recommend starting daily MVI for taste changes. Also discussed ONS. Pt likes vanilla and strawberry. Recommend Ensure Enlive or Ensure Plus HP BID to provide 350kcal and 20g protein/bottle. Provide meal preferences as appropriate. And offer tray setup to  optimize po intake.  Medications reviewed and include: novolog ssi with meals, coumadin, D5NS  Labs reviewed: K:3.3, BG:137, BUN:43, Cr:2.13, Alk Phos:209, AST:55, ALT:63, Bili:2.2, GFR:30   NUTRITION - FOCUSED PHYSICAL EXAM:  Flowsheet Row Most Recent Value  Orbital Region Severe depletion  Upper Arm Region Severe depletion  Thoracic and Lumbar Region Moderate depletion  Buccal Region Severe depletion  Temple Region Severe depletion  Clavicle Bone Region Severe depletion  Clavicle and Acromion Bone Region Severe depletion  Scapular Bone Region Unable to assess  [Pt sleeping and would not cooperate with hand and legs]  Dorsal Hand Unable to assess  Patellar Region Unable to assess  Anterior Thigh Region Unable to assess  Posterior Calf Region Unable to assess  Hair Unable to assess  Eyes Unable to assess  Mouth Unable to assess  Skin Unable to assess  Nails Unable to assess       Diet Order:   Diet Order             Diet regular Room service appropriate? Yes; Fluid consistency: Thin  Diet effective now                   EDUCATION NEEDS:  Education needs have been addressed  Skin:  Skin Assessment: Reviewed RN Assessment  Last BM:  11/13  Height:  Ht Readings from Last 1 Encounters:  12/07/21 _0  (1.753 m)   Weight:  Wt Readings from Last 1 Encounters:  01/18/22 79.8 kg    BMI:  Body mass index is 25.98 kg/m.  Estimated Nutritional Needs:  Kcal:  1995-2395kcal Protein:  80-95g protein Fluid:  1995-2369m  Katie  Evalena Fujii, MS, RD, LDN, CNSC See [facility] for contact information

## 2022-01-19 ENCOUNTER — Inpatient Hospital Stay (HOSPITAL_COMMUNITY): Payer: Medicare HMO

## 2022-01-19 DIAGNOSIS — Z515 Encounter for palliative care: Secondary | ICD-10-CM | POA: Diagnosis not present

## 2022-01-19 DIAGNOSIS — R9431 Abnormal electrocardiogram [ECG] [EKG]: Secondary | ICD-10-CM

## 2022-01-19 DIAGNOSIS — E8721 Acute metabolic acidosis: Secondary | ICD-10-CM | POA: Diagnosis not present

## 2022-01-19 DIAGNOSIS — N179 Acute kidney failure, unspecified: Secondary | ICD-10-CM | POA: Diagnosis not present

## 2022-01-19 DIAGNOSIS — Z7189 Other specified counseling: Secondary | ICD-10-CM

## 2022-01-19 DIAGNOSIS — R531 Weakness: Secondary | ICD-10-CM | POA: Diagnosis not present

## 2022-01-19 DIAGNOSIS — I5022 Chronic systolic (congestive) heart failure: Secondary | ICD-10-CM | POA: Diagnosis not present

## 2022-01-19 DIAGNOSIS — G9341 Metabolic encephalopathy: Secondary | ICD-10-CM | POA: Diagnosis not present

## 2022-01-19 DIAGNOSIS — E876 Hypokalemia: Secondary | ICD-10-CM

## 2022-01-19 LAB — CBC
HCT: 42.8 % (ref 39.0–52.0)
Hemoglobin: 13.2 g/dL (ref 13.0–17.0)
MCH: 26.3 pg (ref 26.0–34.0)
MCHC: 30.8 g/dL (ref 30.0–36.0)
MCV: 85.3 fL (ref 80.0–100.0)
Platelets: 187 10*3/uL (ref 150–400)
RBC: 5.02 MIL/uL (ref 4.22–5.81)
RDW: 17.7 % — ABNORMAL HIGH (ref 11.5–15.5)
WBC: 6.3 10*3/uL (ref 4.0–10.5)
nRBC: 0 % (ref 0.0–0.2)

## 2022-01-19 LAB — ECHOCARDIOGRAM COMPLETE
AR max vel: 1.68 cm2
AV Area VTI: 1.46 cm2
AV Area mean vel: 1.69 cm2
AV Mean grad: 2 mmHg
AV Peak grad: 3.9 mmHg
Ao pk vel: 0.99 m/s
S' Lateral: 2.5 cm
Weight: 2814.83 oz

## 2022-01-19 LAB — BASIC METABOLIC PANEL
Anion gap: 12 (ref 5–15)
BUN: 36 mg/dL — ABNORMAL HIGH (ref 8–23)
CO2: 15 mmol/L — ABNORMAL LOW (ref 22–32)
Calcium: 8.4 mg/dL — ABNORMAL LOW (ref 8.9–10.3)
Chloride: 114 mmol/L — ABNORMAL HIGH (ref 98–111)
Creatinine, Ser: 1.82 mg/dL — ABNORMAL HIGH (ref 0.61–1.24)
GFR, Estimated: 37 mL/min — ABNORMAL LOW (ref >60)
Glucose, Bld: 110 mg/dL — ABNORMAL HIGH (ref 70–99)
Potassium: 3.6 mmol/L (ref 3.5–5.1)
Sodium: 141 mmol/L (ref 135–145)

## 2022-01-19 LAB — GLUCOSE, CAPILLARY
Glucose-Capillary: 114 mg/dL — ABNORMAL HIGH (ref 70–99)
Glucose-Capillary: 184 mg/dL — ABNORMAL HIGH (ref 70–99)
Glucose-Capillary: 133 mg/dL — ABNORMAL HIGH (ref 70–99)
Glucose-Capillary: 106 mg/dL — ABNORMAL HIGH (ref 70–99)

## 2022-01-19 LAB — PROTIME-INR
INR: 1.9 — ABNORMAL HIGH (ref 0.8–1.2)
Prothrombin Time: 21.9 seconds — ABNORMAL HIGH (ref 11.4–15.2)

## 2022-01-19 MED ORDER — TRAMADOL HCL 50 MG PO TABS
50.0000 mg | ORAL_TABLET | Freq: Four times a day (QID) | ORAL | Status: DC | PRN
  Administered 2022-01-19 – 2022-01-25 (×6): 50 mg via ORAL
  Filled 2022-01-19 (×6): qty 1

## 2022-01-19 MED ORDER — HALOPERIDOL LACTATE 5 MG/ML IJ SOLN: 1.0000 mg | Freq: Once | INTRAMUSCULAR | Status: DC

## 2022-01-19 MED ORDER — WARFARIN SODIUM 5 MG PO TABS
5.0000 mg | ORAL_TABLET | Freq: Once | ORAL | Status: AC
  Administered 2022-01-19: 5 mg via ORAL
  Filled 2022-01-19: qty 1

## 2022-01-19 MED ORDER — LACTATED RINGERS IV SOLN: INTRAVENOUS | Status: DC

## 2022-01-19 NOTE — Progress Notes (Signed)
ANTICOAGULATION CONSULT NOTE  Pharmacy Consult for warfarin Indication: atrial fibrillation  No Known Allergies  Patient Measurements: Weight: 79.8 kg (175 lb 14.8 oz)  Vital Signs: Temp: 97.7 F (36.5 C) (11/16 0345) Temp Source: Oral (11/16 0345) BP: 131/91 (11/16 0345) Pulse Rate: 62 (11/16 0345)  Labs: Recent Labs    01/21/2022 1406 01/13/2022 1518 01/17/22 0412 01/18/22 0334 01/19/22 0337  HGB 12.6*  --  12.5* 13.2 13.2  HCT 40.6  --  39.6 40.8 42.8  PLT 271  --  242 231 187  LABPROT 64.1*  --  26.5* 19.2* 21.9*  INR 7.6*  --  2.5* 1.6* 1.9*  CREATININE 3.00*  --  2.73* 2.13* 1.82*  TROPONINIHS 81* 78*  --   --   --      Estimated Creatinine Clearance: 31.3 mL/min (A) (by C-G formula based on SCr of 1.82 mg/dL (H)).   Assessment: 84 yoM with PMH Afib on warfarin PTA, CHF, CAD, DM2, HTN, admitted 11/13 for weakness, weight loss, and general decline. Noted to have elevated INR and SCr on admission. Given Vitamin K 10 mg IV x 1, and Pharmacy consulted to dose warfarin once appropriate.  Baseline INR markedly elevated, likely d/t recent poor PO intake/weight loss (down ~16 kg since this time last year) Prior anticoagulation: Med history incomplete; fill history suggests 5 mg daily (5 mg x 90 tabs for 90 day supply, filled 11/1)  Significant events: - 11/13: Vit K '10mg'$  IV x 1 - 11/14: warfarin dose not given  Today, 01/19/2022: INR nearly therapeutic at 1.9 CBC: Hgb stable; Plt stable WNL Major drug interactions: none No bleeding issues documented  Goal of Therapy: INR 2-3  Plan: Warfarin 5 mg PO tonight at 16:00 Daily INR Monitor for signs of bleeding or thrombosis   Peggyann Juba, PharmD, BCPS Pharmacy: [phone] 01/19/2022, 7:22 AM

## 2022-01-19 NOTE — Evaluation (Signed)
Occupational Therapy Evaluation Patient Details Name: Albert Short MRN: [id number] DOB: 1940/06/22 Today's Date: 01/19/2022   History of Present Illness 81 year old male with PMH of: a fib on warfarin, CAD, CHF, HLD, HTN and DM who presented with several months progressive generalized decline, weight loss and poor oral intake. Pt admitted 01/18/2022 for acute renal failure superimposed on stage 3b chronic kidney disease, acute metabolic encephalopathy, and failure to thrive    Clinical Impression   The patient's spouse reported the pt's current cognition is different from his baseline. He required supervision for supine to sit, mod assist for simulated lower body dressing, and min assist to stand. He presented with dizziness and nausea with progressive activity & subsequently requested to return to sitting after performing an initial stand; his blood pressure was taken with him seated EOB & was 113/70. He ambulated a few steps to the bedside chair without an assistive device, requiring assist for balance and steadying. He then performed simple self-feeding with set-up assist at chair level. He indicated feelings of generalized weakness. He will benefit from further OT services to maximize his safety and independence with self-care tasks, and to decrease the risk for further weakness and deconditioning.      Recommendations for follow up therapy are one component of a multi-disciplinary discharge planning process, led by the attending physician.  Recommendations may be updated based on patient status, additional functional criteria and insurance authorization.   Follow Up Recommendations  Skilled nursing-short term rehab (<3 hours/day)     Assistance Recommended at Discharge Frequent or constant Supervision/Assistance  Patient can return home with the following A lot of help with bathing/dressing/bathroom;Direct supervision/assist for medications management;A lot of help with walking and/or  transfers;Direct supervision/assist for financial management;Assistance with cooking/housework;Assist for transportation    Functional Status Assessment  Patient has had a recent decline in their functional status and demonstrates the ability to make significant improvements in function in a reasonable and predictable amount of time.  Equipment Recommendations  Other (comment) (to be determined pending progress at next setting)       Precautions / Restrictions Precautions Precautions: Fall Restrictions Weight Bearing Restrictions: No      Mobility Bed Mobility Overal bed mobility: Needs Assistance Bed Mobility: Supine to Sit     Supine to sit: Supervision          Transfers Overall transfer level: Needs assistance Equipment used: 2 person hand held assist, Rolling walker (2 wheels) Transfers: Sit to/from Stand, Bed to chair/wheelchair/BSC Sit to Stand: Min assist, Mod assist Stand pivot transfers: Min assist         General transfer comment: initial assist min for weakness and cues for hand placement however pt reported dizziness and needing to sit back down; BP obtained; pt assisted again to standing with 2HHA and requiring mod assist for stability and weakness, encouraged to take a few steps over to recliner for OOB; pt reporting nausea upon sitting in recliner      Balance Overall balance assessment: History of Falls, Needs assistance   Sitting balance-Leahy Scale: Good         Standing balance comment: min-mod assist             ADL either performed or assessed with clinical judgement   ADL   Eating/Feeding: Set up;Sitting Eating/Feeding Details (indicate cue type and reason): he drank water from a cup seated in the bedside chair Grooming: Set up;Sitting Grooming Details (indicate cue type and reason): simulated  Upper Body Dressing : Minimal assistance Upper Body Dressing Details (indicate cue type and reason): based on clinical  judgement Lower Body Dressing: Moderate assistance                      Pertinent Vitals/Pain Pain Assessment Pain Assessment: No/denies pain     Hand Dominance Right   Extremity/Trunk Assessment Upper Extremity Assessment Upper Extremity Assessment: Overall WFL for tasks assessed   Lower Extremity Assessment Lower Extremity Assessment: Generalized weakness     Communication Communication Communication: No difficulties   Cognition Arousal/Alertness: Awake/alert Behavior During Therapy: Flat affect Overall Cognitive Status: Impaired/Different from baseline Area of Impairment: Memory          General Comments: orientated to self only; per pt's spouse pt is "forgetful but not like this"                Home Living Family/patient expects to be discharged to:: Private residence Living Arrangements: Spouse/significant other   Type of Home: House Home Access: Level entry     Home Layout: One level     Bathroom Shower/Tub: Tub/shower unit         Home Equipment: Cane - single Barista (2 wheels);Shower seat          Prior Functioning/Environment Prior Level of Function : Independent/Modified Independent             Mobility Comments: Per pt's spouse, for the past 1.5 weeks he's been steadily declining with mobility, has had a couple falls in the past 3 months, typically does not use assistive device at home but uses SPC in community.  ADLs Comments: Per pt's spouse he doesn't get into the shower; he could sponge bathe, toilet and dress self independently prior to a couple weeks ago. His spouse managed the household chores         OT Problem List: Decreased strength;Impaired balance (sitting and/or standing);Decreased activity tolerance;Decreased cognition;Decreased knowledge of use of DME or AE      OT Treatment/Interventions: Self-care/ADL training;Therapeutic exercise;Therapeutic activities;Cognitive  remediation/compensation;Energy conservation;DME and/or AE instruction;Patient/family education;Balance training    OT Goals(Current goals can be found in the care plan section) Acute Rehab OT Goals Patient Stated Goal: not specifically stated OT Goal Formulation: With patient Time For Goal Achievement: 02/02/22 Potential to Achieve Goals: Good ADL Goals Pt Will Perform Grooming: with supervision;standing Pt Will Perform Upper Body Dressing: with set-up;sitting Pt Will Perform Lower Body Dressing: with supervision;sit to/from stand Pt Will Transfer to Toilet: with supervision;ambulating Pt Will Perform Toileting - Clothing Manipulation and hygiene: with supervision;sit to/from stand  OT Frequency: Min 2X/week    Co-evaluation PT/OT/SLP Co-Evaluation/Treatment: Yes Reason for Co-Treatment: To address functional/ADL transfers;For patient/therapist safety;Necessary to address cognition/behavior during functional activity PT goals addressed during session: Mobility/safety with mobility OT goals addressed during session: ADL's and self-care      AM-PAC OT "6 Clicks" Daily Activity     Outcome Measure Help from another person eating meals?: None Help from another person taking care of personal grooming?: A Little Help from another person toileting, which includes using toliet, bedpan, or urinal?: A Lot Help from another person bathing (including washing, rinsing, drying)?: A Lot Help from another person to put on and taking off regular upper body clothing?: A Little Help from another person to put on and taking off regular lower body clothing?: A Lot 6 Click Score: 16   End of Session Equipment Utilized During Treatment: Gait belt;Rolling walker (2 wheels) Nurse  Communication: Mobility status  Activity Tolerance: Other (comment) (Limited by dizziness and nausea with activity) Patient left: in chair;with call bell/phone within reach;with family/visitor present;with chair alarm set  OT  Visit Diagnosis: Unsteadiness on feet (R26.81);Repeated falls (R29.6);Muscle weakness (generalized) (M62.81)                Time: 2202-5427 OT Time Calculation (min): 23 min Charges:  OT General Charges $OT Visit: 1 Visit OT Evaluation $OT Eval Moderate Complexity: 1 Mod    Alphonsus Doyel L Caidence Kaseman, OTR/L 01/19/2022, 1:43 PM

## 2022-01-19 NOTE — Assessment & Plan Note (Addendum)
Resolved with supplementation

## 2022-01-19 NOTE — Plan of Care (Incomplete)
Patient restless, sleeps and awakens disoriented, reorients slowly.  Primafit in place and patient aware of need to urinate.

## 2022-01-19 NOTE — Evaluation (Signed)
Physical Therapy Evaluation Patient Details Name: Albert Short MRN: [id number] DOB: April 12, 1940 Today's Date: 01/19/2022  History of Present Illness  81 y.o. M with persAF on warfarin, CAD, dCHF, HLD, and DM who presented with several months progressive generalized decline, weight loss and poor oral intake, memory loss, finally stopped eating and drinking and wouldn't get out of bed for last 2 weeks PTA so family called EMS.  Pt admitted 01/07/2022 for Acute renal failure superimposed on stage 3b chronic kidney disease, acute metabolic encephalopathy, FTT.  Clinical Impression  Pt admitted with above diagnosis.  Pt currently with functional limitations due to the deficits listed below (see PT Problem List). Pt will benefit from skilled PT to increase their independence and safety with mobility to allow discharge to the venue listed below.  Pt present with decreased cognition however cooperative and following commands/cues.  Pt assisted with standing however became dizzy and returned to sitting.  Pt able to stand again and assisted with transfer to recliner (overall mod assist) however reported nausea upon sitting in recliner.  Pt encouraged to remain up in recliner for lunch.  Pt may benefit from SNF upon d/c as he was not very mobile leading up to admission and was ambulatory without assistive device at baseline prior to a couple weeks ago.         Recommendations for follow up therapy are one component of a multi-disciplinary discharge planning process, led by the attending physician.  Recommendations may be updated based on patient status, additional functional criteria and insurance authorization.  Follow Up Recommendations Skilled nursing-short term rehab (<3 hours/day) Can patient physically be transported by private vehicle: No    Assistance Recommended at Discharge Frequent or constant Supervision/Assistance  Patient can return home with the following  A little help with walking and/or  transfers;A little help with bathing/dressing/bathroom;Help with stairs or ramp for entrance;Assist for transportation;Assistance with cooking/housework    Equipment Recommendations None recommended by PT  Recommendations for Other Services       Functional Status Assessment Patient has had a recent decline in their functional status and demonstrates the ability to make significant improvements in function in a reasonable and predictable amount of time.     Precautions / Restrictions Precautions Precautions: Fall Restrictions Weight Bearing Restrictions: No      Mobility  Bed Mobility Overal bed mobility: Needs Assistance Bed Mobility: Supine to Sit     Supine to sit: Supervision          Transfers Overall transfer level: Needs assistance Equipment used: 2 person hand held assist, Rolling walker (2 wheels) Transfers: Sit to/from Stand, Bed to chair/wheelchair/BSC Sit to Stand: Min assist, Mod assist Stand pivot transfers: Min assist         General transfer comment: initial assist min for weakness and cues for hand placement however pt reported dizziness and needing to sit back down; BP obtained; pt assisted again to standing with 2HHA and requiring mod assist for stability and weakness, encouraged to take a few steps over to recliner for OOB; pt reporting nausea upon sitting in recliner    Ambulation/Gait               General Gait Details: pt felt unable to tolerate today due to dizziness  Stairs            Wheelchair Mobility    Modified Rankin (Stroke Patients Only)       Balance Overall balance assessment: History of Falls, Needs assistance  Standing balance support: Bilateral upper extremity supported, Reliant on assistive device for balance, During functional activity Standing balance-Leahy Scale: Poor                               Pertinent Vitals/Pain Pain Assessment Pain Assessment: No/denies pain    Home  Living Family/patient expects to be discharged to:: Private residence Living Arrangements: Spouse/significant other   Type of Home: House Home Access: Level entry       Home Layout: One level Home Equipment: Cane - single Barista (2 wheels);Shower seat      Prior Function Prior Level of Function : Independent/Modified Independent             Mobility Comments: for the past 1.5 weeks he's been steadily declining with mobility, has had a couple falls in the past 3 months, typically does not use assistive device at home but uses SPC in community ADLs Comments: doesn't get into the shower, sponge bathes, toilets and dressing independently     Hand Dominance   Dominant Hand: Right    Extremity/Trunk Assessment        Lower Extremity Assessment Lower Extremity Assessment: Generalized weakness    Cervical / Trunk Assessment Cervical / Trunk Assessment: Normal  Communication   Communication: No difficulties  Cognition Arousal/Alertness: Awake/alert Behavior During Therapy: Flat affect Overall Cognitive Status: Impaired/Different from baseline                                 General Comments: orientated to self only; "forgetful but not like this"        General Comments      Exercises     Assessment/Plan    PT Assessment Patient needs continued PT services  PT Problem List Decreased strength;Decreased mobility;Decreased balance;Decreased activity tolerance;Decreased knowledge of use of DME;Decreased safety awareness;Decreased cognition       PT Treatment Interventions Gait training;DME instruction;Therapeutic exercise;Balance training;Functional mobility training;Therapeutic activities;Patient/family education    PT Goals (Current goals can be found in the Care Plan section)  Acute Rehab PT Goals PT Goal Formulation: With patient/family Time For Goal Achievement: 02/02/22 Potential to Achieve Goals: Good    Frequency Min  2X/week     Co-evaluation PT/OT/SLP Co-Evaluation/Treatment: Yes Reason for Co-Treatment: To address functional/ADL transfers;For patient/therapist safety;Necessary to address cognition/behavior during functional activity PT goals addressed during session: Mobility/safety with mobility OT goals addressed during session: ADL's and self-care       AM-PAC PT "6 Clicks" Mobility  Outcome Measure Help needed turning from your back to your side while in a flat bed without using bedrails?: A Little Help needed moving from lying on your back to sitting on the side of a flat bed without using bedrails?: A Little Help needed moving to and from a bed to a chair (including a wheelchair)?: A Lot Help needed standing up from a chair using your arms (e.g., wheelchair or bedside chair)?: A Lot Help needed to walk in hospital room?: A Lot Help needed climbing 3-5 steps with a railing? : Total 6 Click Score: 13    End of Session Equipment Utilized During Treatment: Gait belt Activity Tolerance: Other (comment) (limited by dizziness) Patient left: in chair;with chair alarm set;with family/visitor present;with call bell/phone within reach Nurse Communication: Mobility status PT Visit Diagnosis: Difficulty in walking, not elsewhere classified (R26.2);Muscle weakness (generalized) (M62.81);Adult, failure to thrive (R62.7)  Time: 3295-1884 PT Time Calculation (min) (ACUTE ONLY): 24 min   Charges:   PT Evaluation $PT Eval Moderate Complexity: 1 Mod         Kati PT, DPT Physical Therapist Acute Rehabilitation Services Preferred contact method: Secure Chat Weekend Pager Only: [phone] Office: [place] 01/19/2022, 12:55 PM

## 2022-01-19 NOTE — NC FL2 (Signed)
[place] LEVEL OF CARE SCREENING TOOL     IDENTIFICATION  Patient Name: Firas Guardado: 1940/09/28 Sex: male Admission Date (Current Location): 01/31/2022  [facility] [place] and [place] Number:  Herbalist and Address:  [facility],  [address], [place]      Provider Number: [id number]  Attending Physician Name and Address:  Edwin Dada, *  Relative Name and Phone Number:       Current Level of Care: Hospital Recommended Level of Care: [place] Prior Approval Number:    Date Approved/Denied:   PASRR Number: [id number]  Discharge Plan: SNF    Current Diagnoses: Patient Active Problem List   Diagnosis Date Noted   Acute metabolic acidosis 63/89/3734   Protein-calorie malnutrition, severe 01/18/2022   Likely dementia 28/76/8115   Acute metabolic encephalopathy 72/62/0355   FTT (failure to thrive) in adult 01/19/2022   Supratherapeutic INR 01/26/2022   Essential hypertension 05/21/2019   Hyponatremia 11/23/2014   Acute renal failure superimposed on stage 3b chronic kidney disease ([place]) 11/23/2014   UTI (lower urinary tract infection) 97/41/6384   Chronic systolic CHF (congestive heart failure) (HCC) 11/23/2014   Chest pain 11/22/2014   Paroxysmal atrial fibrillation (HCC)    Old myocardial infarct    Hypercholesterolemia    Obesity    Diverticulosis    GERD (gastroesophageal reflux disease)    CAD (coronary artery disease)    DM type 2 (diabetes mellitus, type 2) (HCC)     Orientation RESPIRATION BLADDER Height & Weight     Self, Time, Situation, Place  Normal Continent Weight: 79.8 kg Height:     BEHAVIORAL SYMPTOMS/MOOD NEUROLOGICAL BOWEL NUTRITION STATUS      Continent Diet (regular)  AMBULATORY STATUS COMMUNICATION OF NEEDS Skin   Extensive Assist Verbally Normal                       Personal Care Assistance Level of Assistance  Bathing, Feeding, Dressing Bathing  Assistance: Limited assistance Feeding assistance: Limited assistance Dressing Assistance: Limited assistance     Functional Limitations Info  Sight, Hearing, Speech Sight Info: Adequate Hearing Info: Adequate Speech Info: Adequate    SPECIAL CARE FACTORS FREQUENCY  PT (By licensed PT), OT (By licensed OT)     PT Frequency: 5 x weekly OT Frequency: 5 x weekly            Contractures Contractures Info: Not present    Additional Factors Info  Code Status Code Status Info: full             Current Medications (01/19/2022):  This is the current hospital active medication list Current Facility-Administered Medications  Medication Dose Route Frequency Provider Last Rate Last Admin   acetaminophen (TYLENOL) tablet 650 mg  650 mg Oral Q6H PRN Marcelyn Bruins, MD   650 mg at 01/19/22 5364   Or   acetaminophen (TYLENOL) suppository 650 mg  650 mg Rectal Q6H PRN Marcelyn Bruins, MD       feeding supplement (ENSURE ENLIVE / ENSURE PLUS) liquid 237 mL  237 mL Oral BID BM Danford, Suann Larry, MD   237 mL at 01/19/22 0935   hydrALAZINE (APRESOLINE) injection 10 mg  10 mg Intravenous Q8H PRN Lorella Nimrod, MD       insulin aspart (novoLOG) injection 0-9 Units  0-9 Units Subcutaneous TID WC Marcelyn Bruins, MD   1 Units at 01/18/22 1707   lactated  ringers infusion   Intravenous Continuous Edwin Dada, MD 75 mL/hr at 01/19/22 0747 New Bag at 01/19/22 0747   metoprolol tartrate (LOPRESSOR) tablet 25 mg  25 mg Oral BID Lorella Nimrod, MD   25 mg at 01/19/22 0934   multivitamin with minerals tablet 1 tablet  1 tablet Oral Daily Edwin Dada, MD   1 tablet at 01/19/22 0934   polyethylene glycol (MIRALAX / GLYCOLAX) packet 17 g  17 g Oral Daily PRN Marcelyn Bruins, MD       sodium chloride flush (NS) 0.9 % injection 3 mL  3 mL Intravenous Q12H Marcelyn Bruins, MD   3 mL at 01/18/22 2153   warfarin (COUMADIN) tablet 5 mg  5 mg Oral ONCE-1600  Emiliano Dyer, [facility]       Warfarin - Pharmacist Dosing Inpatient   Does not apply [id number] Polly Cobia [facility]         Discharge Medications: Please see discharge summary for a list of discharge medications.  Relevant Imaging Results:  Relevant Lab Results:   Additional Information [phone]  Leeroy Cha, RN

## 2022-01-19 NOTE — TOC Progression Note (Signed)
Transition of Care [facility]) - Progression Note    Patient Details  Name: [name] MRN: [id number] Date of Birth: October 20, 1940  [facility] of [facility] CM/SW Contact  Leeroy Cha, RN Phone Number: 01/19/2022, 1:24 PM  Clinical Narrative:    Tct-wife-has chosen [facility] for snf.  Auth with [employer] submitted at 1324.   Expected Discharge Plan: Home/Self Care Barriers to Discharge: Continued Medical Work up  Expected Discharge Plan and Services Expected Discharge Plan: Home/Self Care   Discharge Planning Services: CM Consult   Living arrangements for the past 2 months: Single Family Home                                       Social Determinants of Health (SDOH) Interventions    Readmission Risk Interventions   No data to display

## 2022-01-19 NOTE — TOC Progression Note (Signed)
Transition of Care [facility]) - Progression Note    Patient Details  Name: Albert Short MRN: [id number] Date of Birth: 10/17/1940  [facility] CM/SW Contact  Leeroy Cha, RN Phone Number: 01/19/2022, 10:07 AM  Clinical Narrative:    Fl2 send out for review to all area snfs.pt lives in [place] area.  [place] number is [id number] A.  [phone].   Expected Discharge Plan: Home/Self Care Barriers to Discharge: Continued Medical Work up  Expected Discharge Plan and Services Expected Discharge Plan: Home/Self Care   Discharge Planning Services: CM Consult   Living arrangements for the past 2 months: Single Family Home                                       Social Determinants of Health (SDOH) Interventions    Readmission Risk Interventions   No data to display

## 2022-01-19 NOTE — Assessment & Plan Note (Addendum)
Secondary to hypochloridemia. improved.

## 2022-01-19 NOTE — Progress Notes (Signed)
  Echocardiogram 2D Echocardiogram has been performed.  Albert Short 01/19/2022, 9:48 AM

## 2022-01-19 NOTE — Progress Notes (Signed)
Progress Note   Patient: Albert Short QPY:[id number] DOB: January 19, 1941 DOA: 01/19/2022     2 DOS: the patient was seen and examined on 01/19/2022 at 9:45AM      Brief hospital course: Mr. Harbuck is an 81 y.o. M with persAF on warfarin, CAD, dCHF, HLD, and DM who presented with several months progressive generalized decline, weight loss and poor oral intake, memory loss, finally stopped eating and drinking and wouldn't get out of bed for last 2 weeks PTA so family called EMS.  In the ER, appeared dehydrated, Cr 3 (from baseline 1.4-1.9).  INR 7.6 and microscopic hematuria.  CT with lobular contour of left kidney  11/13: Admitted on fluids for AKI, CT and renal US completed 11/14: Cr improving, delirious 11/15: Cr improving, delirium persistent but better 11/16: Cr still up but better, still delirious but able to work with PT     Assessment and Plan: * Acute renal failure superimposed on stage 3b chronic kidney disease ([place]) Baseline 1.4-1.9 Cr 3.0 on admission, treated with fluids and > 2.73 > 2.13 > 1.8 today UA was grossly bloody in setting of INR >7, US renal showed lobular contour of left kidney without obvious mass or hydronephrosis.  No renal electrolytes done.  Given improvement with fluids, appears ischemic injury - Continue IV fluids today - Trend Cr    Acute metabolic encephalopathy Family report consistent prior memory loss over months or longer.  No prior diagnosis of dementia. Here, has delirium, slightly better today  - Continue standard delirium precautions: blinds open and lights on during day, TV off, minimize interruptions at night, glasses/hearing aids, PT/OT, avoiding Beers list medications   FTT (failure to thrive) in adult Slow progressive decline over months, worse in the last 2 weeks. Hematuria and weight loss raise concern for cancer. - Plan for CT abdomen and pelvis with contrast tomorrow  Hypokalemia Supplemented and resolved.  Acute  metabolic acidosis Due to NaCl. - Switch to LR today  Likely dementia     Protein-calorie malnutrition, severe As evidenced by poor PO intake, weight loss of 16% in 5 months, and severe diffuse loss of subcutaneous muscle mass and fat.  Supratherapeutic INR Received vitamin K in ER due to hematuria.  Now INR down to normal range. - Continue wrfarin per pharmacy   Essential hypertension BP controlled - Continue metoprolol - Hold losartan, amlodipine, clonidine, HCTZ  Chronic systolic CHF (congestive heart failure) (HCC) Euvolemic now - Hold losartan - Continue metoprolol   DM type 2 (diabetes mellitus, type 2) (HCC) A1c 6.5%, glucose controlled here - Continue SS corrections    CAD (coronary artery disease) S/p CABG in 2012.  No chest pain or ECG changes, ischemia ruled out, troponin elevation is due to renal failure and poor clearance only.  Echo today poor quality, EF normal, no RWMA.  Hypercholesterolemia Had stopped simvastatin at home  Paroxysmal atrial fibrillation (HCC) Remains in sinus. INR today 1.9 - Continue metoprolol - Continue warfarin per pharmacy            Subjective: No fever, no reports of chest pain, dyspnea, vomiting, respiratory distress.  Has some chronic aches and pains/musculoskeletal complaints.     Physical Exam: BP 125/86 (BP Location: Right Arm)   Pulse (!) 101   Temp 97.7 F (36.5 C) (Oral)   Resp 16   Wt 79.8 kg   SpO2 97%   BMI 25.98 kg/m   Elderly adult male, lying in bed, appears weak and tired, under nourished  RRR, no murmurs, no peripheral edema, no JVD Respiratory rate normal, lungs clear without rales or wheezes Abdomen soft without tenderness palpation or guarding, no ascites or distention Attention diminished, affect blunted, judgment insight appear impaired, oriented to self and family only, not restless, moves upper extremities with generalized weakness but symmetric strength    Data Reviewed: Creatinine  down to 1.8, bicarb low, potassium up To normal INR 1.9, CBC no change    Family Communication: Wife and daughter at the bedside    Disposition: Status is: Inpatient Cr still elevated, The patient will require IV fluids 1 more day, likely creatinine will be stable tomorrow can discharge to rehab at that time        Author: Edwin Dada, MD 01/19/2022 11:06 AM  For on call review [email or web address].

## 2022-01-19 NOTE — Progress Notes (Signed)
Daily Progress Note   Patient Name: Albert Short       Date: 01/19/2022 DOB: 09/13/1940  Age: 81 y.o. MRN#: [id number] Attending Physician: Edwin Dada, * Primary Care Physician: Leeroy Cha, MD Admit Date: 01/17/2022  Reason for Consultation/Follow-up: Establishing goals of care  Subjective: Awake, answers questions appropriately, family at bedside, just got done with surface echocardiogram, safety sitter also present in the room.  Does not have mittens anymore, alert and cooperative, not agitated.  Length of Stay: 2  Current Medications: Scheduled Meds:  . feeding supplement  237 mL Oral BID BM  . insulin aspart  0-9 Units Subcutaneous TID WC  . metoprolol tartrate  25 mg Oral BID  . multivitamin with minerals  1 tablet Oral Daily  . sodium chloride flush  3 mL Intravenous Q12H  . warfarin  5 mg Oral ONCE-1600  . Warfarin - Pharmacist Dosing Inpatient   Does not apply q1600    Continuous Infusions: . lactated ringers 75 mL/hr at 01/19/22 0747    PRN Meds: acetaminophen **OR** acetaminophen, hydrALAZINE, polyethylene glycol  Physical Exam         Awake alert resting in bed Not agitated no acute distress Answers a few questions appropriately Attempts to follow commands Regular work of breathing No edema  Vital Signs: BP (!) 131/91 (BP Location: Right Arm)   Pulse 62   Temp 97.7 F (36.5 C) (Oral)   Resp 20   Wt 79.8 kg   SpO2 97%   BMI 25.98 kg/m  SpO2: SpO2: 97 % O2 Device: O2 Device: Room Air O2 Flow Rate:    Intake/output summary:  Intake/Output Summary (Last 24 hours) at 01/19/2022 9179 Last data filed at 01/19/2022 0900 Gross per 24 hour  Intake 868.81 ml  Output 550 ml  Net 318.81 ml   LBM: Last BM Date : 01/18/22 Baseline  Weight: Weight: 79.8 kg Most recent weight: Weight: 79.8 kg       Palliative Assessment/Data:      Patient Active Problem List   Diagnosis Date Noted  . Acute metabolic acidosis 15/07/6977  . Protein-calorie malnutrition, severe 01/18/2022  . Likely dementia 01/18/2022  . Acute metabolic encephalopathy 48/03/6551  . FTT (failure to thrive) in adult 01/25/2022  . Supratherapeutic INR 01/18/2022  . Essential hypertension 05/21/2019  . Hyponatremia  11/23/2014  . Acute renal failure superimposed on stage 3b chronic kidney disease ([place]) 11/23/2014  . UTI (lower urinary tract infection) 11/23/2014  . Chronic systolic CHF (congestive heart failure) ([place]) 11/23/2014  . Chest pain 11/22/2014  . Paroxysmal atrial fibrillation (HCC)   . Old myocardial infarct   . Hypercholesterolemia   . Obesity   . Diverticulosis   . GERD (gastroesophageal reflux disease)   . CAD (coronary artery disease)   . DM type 2 (diabetes mellitus, type 2) [facility])     Palliative Care Assessment & Plan   Patient Profile:    Assessment: 81 year old gentleman with A-fib coronary artery disease heart failure dyslipidemia and diabetes who is admitted to hospital medicine service because of generalized functional decline weight loss memory loss and is admitted with acute kidney injury, underlying [place] stage III chronic kidney disease, failure to thrive, hospital course complicated by delirium and ongoing severe protein calorie malnutrition.  Recommendations/Plan:  Medicine team consulted for broad goals of care discussions.  Remains full code full scope.  Patient's family is at bedside.  Continue current mode of care.  PT OT evaluation, undergoing further work-up, recommend addition of palliative services in the outpatient setting.  Follow nonpharmacological delirium precautions.  Goals of Care and Additional Recommendations: Limitations on Scope of Treatment: Full Scope Treatment  Code Status:    Code  Status Orders  (From admission, onward)           Start     Ordered   01/08/2022 1857  Full code  Continuous        01/26/2022 1859           Code Status History     Date Active Date Inactive Code Status Order ID Comments User Context   11/22/2014 2237 11/24/2014 1546 Full Code [id number]  Lavina Hamman, MD ED       Prognosis:  Unable to determine  Discharge Planning: To Be Determined  Care plan was discussed with patient and family who are at the bedside  Thank you for allowing the Palliative Medicine Team to assist in the care of this patient.  Low MDM.     Greater than 50%  of this time was spent counseling and coordinating care related to the above assessment and plan.  Loistine Chance, MD  Please contact Palliative Medicine Team phone at [phone] for questions and concerns.

## 2022-01-20 ENCOUNTER — Inpatient Hospital Stay (HOSPITAL_COMMUNITY): Payer: Medicare HMO

## 2022-01-20 DIAGNOSIS — N179 Acute kidney failure, unspecified: Secondary | ICD-10-CM | POA: Diagnosis not present

## 2022-01-20 DIAGNOSIS — I5022 Chronic systolic (congestive) heart failure: Secondary | ICD-10-CM | POA: Diagnosis not present

## 2022-01-20 DIAGNOSIS — G9341 Metabolic encephalopathy: Secondary | ICD-10-CM | POA: Diagnosis not present

## 2022-01-20 DIAGNOSIS — I82220 Acute embolism and thrombosis of inferior vena cava: Secondary | ICD-10-CM | POA: Insufficient documentation

## 2022-01-20 DIAGNOSIS — E8721 Acute metabolic acidosis: Secondary | ICD-10-CM | POA: Diagnosis not present

## 2022-01-20 LAB — HEPATIC FUNCTION PANEL
ALT: 82 U/L — ABNORMAL HIGH (ref 0–44)
AST: 80 U/L — ABNORMAL HIGH (ref 15–41)
Albumin: 3.2 g/dL — ABNORMAL LOW (ref 3.5–5.0)
Alkaline Phosphatase: 240 U/L — ABNORMAL HIGH (ref 38–126)
Bilirubin, Direct: 0.7 mg/dL — ABNORMAL HIGH (ref 0.0–0.2)
Indirect Bilirubin: 0.9 mg/dL (ref 0.3–0.9)
Total Bilirubin: 1.6 mg/dL — ABNORMAL HIGH (ref 0.3–1.2)
Total Protein: 6.6 g/dL (ref 6.5–8.1)

## 2022-01-20 LAB — BASIC METABOLIC PANEL
Anion gap: 10 (ref 5–15)
BUN: 41 mg/dL — ABNORMAL HIGH (ref 8–23)
CO2: 20 mmol/L — ABNORMAL LOW (ref 22–32)
Calcium: 8.8 mg/dL — ABNORMAL LOW (ref 8.9–10.3)
Chloride: 111 mmol/L (ref 98–111)
Creatinine, Ser: 1.9 mg/dL — ABNORMAL HIGH (ref 0.61–1.24)
GFR, Estimated: 35 mL/min — ABNORMAL LOW (ref >60)
Glucose, Bld: 97 mg/dL (ref 70–99)
Potassium: 4.1 mmol/L (ref 3.5–5.1)
Sodium: 141 mmol/L (ref 135–145)

## 2022-01-20 LAB — GLUCOSE, CAPILLARY
Glucose-Capillary: 74 mg/dL (ref 70–99)
Glucose-Capillary: 81 mg/dL (ref 70–99)
Glucose-Capillary: 151 mg/dL — ABNORMAL HIGH (ref 70–99)
Glucose-Capillary: 92 mg/dL (ref 70–99)
Glucose-Capillary: 125 mg/dL — ABNORMAL HIGH (ref 70–99)

## 2022-01-20 LAB — PROTIME-INR
INR: 2.8 — ABNORMAL HIGH (ref 0.8–1.2)
Prothrombin Time: 29.6 seconds — ABNORMAL HIGH (ref 11.4–15.2)

## 2022-01-20 MED ORDER — IOHEXOL 300 MG/ML  SOLN
80.0000 mL | Freq: Once | INTRAMUSCULAR | Status: AC | PRN
  Administered 2022-01-20: 80 mL via INTRAVENOUS

## 2022-01-20 MED ORDER — LACTATED RINGERS IV SOLN: INTRAVENOUS | Status: DC

## 2022-01-20 MED ORDER — WARFARIN 0.5 MG HALF TABLET
0.5000 mg | ORAL_TABLET | Freq: Once | ORAL | Status: AC
  Administered 2022-01-20: 0.5 mg via ORAL
  Filled 2022-01-20: qty 1

## 2022-01-20 MED ORDER — QUETIAPINE FUMARATE 25 MG PO TABS
25.0000 mg | ORAL_TABLET | Freq: Every day | ORAL | Status: DC
  Administered 2022-01-20 – 2022-01-21 (×2): 25 mg via ORAL
  Filled 2022-01-20 (×2): qty 1

## 2022-01-20 MED ORDER — WARFARIN SODIUM 2.5 MG PO TABS: 2.5000 mg | ORAL_TABLET | Freq: Once | ORAL | Status: DC

## 2022-01-20 MED ORDER — ORAL CARE MOUTH RINSE: 15.0000 mL | OROMUCOSAL | Status: DC | PRN

## 2022-01-20 MED ORDER — DOCUSATE SODIUM 100 MG PO CAPS
100.0000 mg | ORAL_CAPSULE | Freq: Every day | ORAL | Status: DC
  Administered 2022-01-20 – 2022-01-25 (×5): 100 mg via ORAL
  Filled 2022-01-20 (×5): qty 1

## 2022-01-20 NOTE — Care Management Important Message (Signed)
Important Message  Patient Details IM Letter given. Name: TAHSIN BENYO MRN: [id number] Date of Birth: 29-Apr-1940   Medicare Important Message Given:  Yes     Kerin Salen 01/20/2022, 10:35 AM
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
## 2022-01-20 NOTE — Progress Notes (Signed)
Hypoglycemic Event  CBG: 74  Treatment: 8 oz juice/soda  Symptoms: Nervous/irritable and Vision changes  Follow-up CBG: Time:0821 CBG Result:81  Possible Reasons for Event: Inadequate meal intake  Comments/MD notified:MD notified, no new orders    Angie Fava

## 2022-01-20 NOTE — Progress Notes (Addendum)
Patient's family helped patient eat 25% of breakfast (eggs, sausage, grits, potatoes).  Patient alert to self.  Requesting OOB.  Attempted to stand patient at bedside, but patient unable to hold own weight enough to sit on edge of bed.  Restraints removed.  Patient calm and cooperative.  Angie Fava, RN

## 2022-01-20 NOTE — Assessment & Plan Note (Signed)
Noted on CT imaging. Reimaging complicated by underlying AKI on CKD. Will discontinue repeat CT scan at this time and consider alternatives pending continued goals of care discussions. Patient is on Coumadin chronically for atrial fibrillation.

## 2022-01-20 NOTE — Progress Notes (Signed)
ANTICOAGULATION CONSULT NOTE  Pharmacy Consult for warfarin Indication: atrial fibrillation  No Known Allergies  Patient Measurements: Height: '5\' 9"'$  (175.3 cm) Weight: 82.4 kg (181 lb 10.5 oz) IBW/kg (Calculated) : 70.7  Vital Signs: Temp: 98.7 F (37.1 C) (11/17 0436) Temp Source: Oral (11/17 0436) BP: 140/65 (11/17 0436) Pulse Rate: 82 (11/17 0436)  Labs: Recent Labs    01/18/22 0334 01/19/22 0337 01/20/22 0402  HGB 13.2 13.2  --   HCT 40.8 42.8  --   PLT 231 187  --   LABPROT 19.2* 21.9* 29.6*  INR 1.6* 1.9* 2.8*  CREATININE 2.13* 1.82* 1.90*     Estimated Creatinine Clearance: 30 mL/min (A) (by C-G formula based on SCr of 1.9 mg/dL (H)).   Assessment: 58 yoM with PMH Afib on warfarin PTA, CHF, CAD, DM2, HTN, admitted 11/13 for weakness, weight loss, and general decline. Noted to have elevated INR and SCr on admission. Given Vitamin K 10 mg IV x 1, and Pharmacy consulted to dose warfarin once appropriate.  Baseline INR markedly elevated, likely d/t recent poor PO intake/weight loss (down ~16 kg since this time last year) Prior anticoagulation: Med history incomplete; fill history suggests 5 mg daily (5 mg x 90 tabs for 90 day supply, filled 11/1)  Significant events: - 11/13: Vit K '10mg'$  IV x 1 - 11/14: warfarin dose not given  Today, 01/20/2022: INR therapeutic at 2.8 but significant rise from yesterday CBC on 11/16: Hgb stable; Plt stable WNL Major drug interactions: none No bleeding issues documented  Goal of Therapy: INR 2-3  Plan: Decrease Warfarin 0.5 mg PO tonight at 16:00 Daily INR Monitor for signs of bleeding or thrombosis   Peggyann Juba, PharmD, BCPS Pharmacy: [phone] 01/20/2022, 7:33 AM

## 2022-01-20 NOTE — Progress Notes (Addendum)
Progress Note   Patient: Albert Short [id number] DOB: 11/23/1940 DOA: 01/22/2022     3 DOS: the patient was seen and examined on 01/20/2022 at 9:15AM      Brief hospital course: [name] is an 81 y.o. M with persAF on warfarin, CAD, dCHF, HLD, and DM who presented with several months progressive generalized decline, weight loss and poor oral intake, memory loss, finally stopped eating and drinking and wouldn't get out of bed for last 2 weeks PTA so family called EMS.  In the ER, appeared dehydrated, Cr 3 (from baseline 1.4-1.9).  INR 7.6 and microscopic hematuria.  CT with lobular contour of left kidney  11/13: Admitted on fluids for AKI, CT and renal US completed 11/14: Cr improving, delirious 11/15: Cr improving, delirium persistent but better 11/16: Cr still up but better, still delirious but able to work with PT 11/17: Cr stabilized, delirious overnight requiring restraints, discharge canceled     Assessment and Plan: * Acute renal failure superimposed on stage 3b chronic kidney disease ([place]) Baseline 1.4-1.9 Cr 3.0 on admission, treated with fluids and > 2.73 > 2.13 > 1.8 > 1.9 stabilized to baseline.  Appears to have been ischemic injury, improved with fluids - Hold fluids       Acute metabolic encephalopathy Family report consistent prior memory loss over months or longer just without a prior formal diagnosis of dementia. Here, he has had waxing and waning delirium due to AKI initially, now due to hospitalization/dementia/FTT out of his ordinary setting. - Continue standard delirium precautions: blinds open and lights on during day, TV off, minimize interruptions at night, glasses/hearing aids, PT/OT, avoiding Beers list medications   Overnight last night agitated, required restraints again: - Start Seroquel 25 nightly, titrate as needed - PT  FTT (failure to thrive) in adult Slow progressive decline over months, worse in the last 2 weeks. Now not eating  and deteriorated to the point he couldn't stand at home. Last cardiology note says "Having significant memory issues" Hematuria and weight loss raise concern for cancer. If not cancer, suspect this is progrsesion of dementia. 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  We will need 24 hours fluids to washout contrast, then Sunday, repeat CT with angiography.  Order placed. - Consult Urology           Hypokalemia Supplemented and resolved.  Acute metabolic acidosis Due to sodium chloride.  Now resolved.  Likely dementia      Protein-calorie malnutrition, severe As evidenced by poor PO intake, weight loss of 16% in 5 months, and severe diffuse loss of subcutaneous muscle mass and fat.  Supratherapeutic INR Received vitamin K in ER due to hematuria.  Now INR down to normal range. - Continue wrfarin per pharmacy   Essential hypertension BP controlled - Continue metoprolol - Hold losartan, amlodipine, clonidine, HCTZ  Chronic systolic CHF (congestive heart failure) (HCC) Euvolemic now - Hold losartan - Continue metoprolol   DM type 2 (diabetes mellitus, type 2) (HCC) A1c 6.5%, glucose controlled here - Continue SS corrections    CAD (coronary artery disease) S/p CABG in 2012.  No chest pain or ECG changes, ischemia ruled out, troponin elevation is due to renal failure and poor clearance only.  Echo poor quality, EF appears reduced,  as it was in 2016. Given absence of symptoms at Cardiology appt 4 weeks ago, and absence of chest pain or DOE, I do not suspect this delirium is late effects of MI.  Hypercholesterolemia Had stopped simvastatin at home    Paroxysmal atrial fibrillation (HCC) Remains in sinus. INR therapeutic -  Continue metoprolol - Continue warfarin per pharmacy            Subjective: Agitated overnight, calm and cooperative this morning.  No fever, no respiratoiry distress, no swelling, no vomiting.     Physical Exam: BP (!) 140/65 (BP Location: Left Arm)   Pulse 82   Temp 98.7 F (37.1 C) (Oral)   Resp 18   Ht '5\' 9"'$  (1.753 m)   Wt 82.4 kg   SpO2 97%   BMI 26.83 kg/m   Elderly malnourished adult male, sitting up in bed, rambling incoherently. RRR no murmurs, no LE edema Respiratory rate normal, lungs clear without rales or wheezes Abdomen soft without tenderness, guarding Attention diminished, confused, oriented to self and family only, otherwise ramblinga nd confused, Moves all extremities with generalized weakness but symmetric strength     Data Reviewed: BMP shows stable renal function, normal electrolytes   Family Communication: wife and daguhter at bedside    Disposition: Status is: Inpatient The patient was admitted for renal failure, this is resolved.  Unfortunately he has had a slow progressive decline of weight loss and worsening memory function over the last several months, and in that context of failure to thrive he is having severe delirium here  He was supposed to have been discharged this morning, but because of agitation and restraints last night, the nursing home of rescinded his bed offer.  We will have to keep him over the weekend.  All efforts should be made to minimize delirium.  We will start Seroquel at night.  Hopefully he can transition to rehab on Monday        Author: Edwin Dada, MD 01/20/2022 12:52 PM  For on call review [email or web address].
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
## 2022-01-20 NOTE — Consult Note (Signed)
Urology Consult   Reason for consult: suspicious renal mass  History of Present Illness: JASUN GASPARINI is a 81 y.o. who initially presented to [facility] ED c/o several months of progressive weakness, weight loss, poor PO intake. In the ER, his CR was elevated and INR was found to be elevated. A CT scan was later obtained which is suspicious for a L renal cancer - RCC vs UC. 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 It is difficult to tell exactly if it extends into either renal vein.  I reviewed Mr Bifulco's CT Scan. There appears to be a mass in the upper pole of the left kidney. On the coronals, it looks like it may extend into the collecting system, and there is some debris vs tumor in the left collecting system.   Mr Lad is not able to provide a history but I spoke with two family members at his bedside.  Past Medical History:  Diagnosis Date   A-fib [facility])    resolved 2006   CAD (coronary artery disease)    Diverticulosis    DM type 2 (diabetes mellitus, type 2) (HCC)    GERD (gastroesophageal reflux disease)    Hypercholesterolemia    MI (myocardial infarction) ([place]) 04/1986   preserved OV systolic function with apical akinesis    Obesity     Past Surgical History:  Procedure Laterality Date   CARDIAC CATHETERIZATION  1939/04/09   Dr Daneen Schick   CORONARY ARTERY BYPASS GRAFT     Coronary artery bypass grafting x4  10/27/10   [place] Gum    Current [facility] Medications:  Home Meds:  No current facility-administered medications on file prior to encounter.   Current Outpatient Medications on File Prior to Encounter  Medication Sig Dispense Refill   acetaminophen (TYLENOL) 500 MG tablet Take 500-1,000 mg by mouth every 6 (six) hours as needed for mild pain or headache.     amLODipine (NORVASC) 10 MG tablet Take 10 mg by mouth in the morning.     Cholecalciferol (VITAMIN D3 SUPER STRENGTH) 50 MCG (2000 UT) CAPS Take 2,000 Units by mouth  daily.     cloNIDine (CATAPRES) 0.1 MG tablet Take 0.1 mg by mouth at bedtime.     clotrimazole (LOTRIMIN) 1 % cream Apply 1 Application topically 2 (two) times daily as needed (to affected areas of the feet- for irritation).     docusate sodium (COLACE) 100 MG capsule Take 100 mg by mouth daily.     fluticasone (FLONASE) 50 MCG/ACT nasal spray Place 1-2 sprays into both nostrils daily as needed for allergies or rhinitis.     hydrochlorothiazide (HYDRODIURIL) 25 MG tablet Take 0.5 tablets (12.5 mg total) by mouth daily. (Patient taking differently: Take 12.5 mg by mouth in the morning.)     loratadine (CLARITIN) 10 MG tablet Take 10 mg by mouth daily as needed for allergies or rhinitis.     losartan (COZAAR) 100 MG tablet Take 100 mg by mouth every evening.     metFORMIN (GLUCOPHAGE) 500 MG tablet Take 500 mg by mouth 2 (two) times daily with a meal.     metoprolol tartrate (LOPRESSOR) 25 MG tablet Take 1 tablet (25 mg total) by mouth 2 (two) times daily. Please make appt with Dr. Tamala Julian for October before anymore refills. 1st attempt (Patient taking differently: Take 25 mg by mouth 2 (two) times daily with a meal.) 180 tablet 0   traMADol (ULTRAM) 50  MG tablet Take 50 mg by mouth every 6 (six) hours as needed (for pain).     warfarin (COUMADIN) 5 MG tablet Take 5 mg by mouth daily at 6 PM.     nitroGLYCERIN (NITROSTAT) 0.4 MG SL tablet Place 0.4 mg under the tongue every 5 (five) minutes x 3 doses as needed for chest pain.       Scheduled Meds:  docusate sodium  100 mg Oral Daily   feeding supplement  237 mL Oral BID BM   insulin aspart  0-9 Units Subcutaneous TID WC   metoprolol tartrate  25 mg Oral BID   multivitamin with minerals  1 tablet Oral Daily   QUEtiapine  25 mg Oral QHS   sodium chloride flush  3 mL Intravenous Q12H   Warfarin - Pharmacist Dosing Inpatient   Does not apply q1600   Continuous Infusions:  lactated ringers     PRN Meds:.acetaminophen **OR** acetaminophen,  hydrALAZINE, mouth rinse, polyethylene glycol, traMADol  Allergies: No Known Allergies  Family History  Problem Relation Age of Onset   Heart disease Brother    Diabetes Mother    Alcoholism Father    Heart disease Paternal Grandfather     Social History:  reports that he quit smoking about 35 years ago. His smoking use included cigarettes. He has never used smokeless tobacco. He reports that he does not drink alcohol and does not use drugs.  ROS: A complete review of systems was performed.  All systems are negative except for pertinent findings as noted.  Physical Exam:  Vital signs in last 24 hours: Temp:  [97.8 F (36.6 C)-98.7 F (37.1 C)] 98.7 F (37.1 C) (11/17 0436) Pulse Rate:  [65-82] 82 (11/17 0436) Resp:  [18] 18 (11/17 0436) BP: (107-149)/(65-84) 140/65 (11/17 0436) SpO2:  [97 %-100 %] 97 % (11/17 0436) Weight:  [82.4 kg-84.7 kg] 82.4 kg (11/17 0436) Constitutional:  Alert and oriented, No acute distress Cardiovascular: Regular rate and rhythm Respiratory: Normal respiratory effort, Lungs clear bilaterally GI: Abdomen is soft, nontender, nondistended, no abdominal masses GU: No CVA tenderness Neurologic: Grossly intact, no focal deficits Psychiatric: Normal mood and affect  Laboratory Data:  Recent Labs    01/18/22 0334 01/19/22 0337  WBC 7.2 6.3  HGB 13.2 13.2  HCT 40.8 42.8  PLT 231 187    Recent Labs    01/18/22 0334 01/19/22 0337 01/20/22 0402  NA 140 141 141  K 3.3* 3.6 4.1  CL 109 114* 111  GLUCOSE 137* 110* 97  BUN 43* 36* 41*  CALCIUM 8.8* 8.4* 8.8*  CREATININE 2.13* 1.82* 1.90*     Results for orders placed or performed during the hospital encounter of 01/24/2022 (from the past 24 hour(s))  Glucose, capillary     Status: Abnormal   Collection Time: 01/19/22  8:04 PM  Result Value Ref Range   Glucose-Capillary 106 (H) 70 - 99 mg/dL  Protime-INR     Status: Abnormal   Collection Time: 01/20/22  4:02 AM  Result Value Ref Range    Prothrombin Time 29.6 (H) 11.4 - 15.2 seconds   INR 2.8 (H) 0.8 - 1.2  Basic metabolic panel     Status: Abnormal   Collection Time: 01/20/22  4:02 AM  Result Value Ref Range   Sodium 141 135 - 145 mmol/L   Potassium 4.1 3.5 - 5.1 mmol/L   Chloride 111 98 - 111 mmol/L   CO2 20 (L) 22 - 32 mmol/L   Glucose, Bld  97 70 - 99 mg/dL   BUN 41 (H) 8 - 23 mg/dL   Creatinine, Ser 1.90 (H) 0.61 - 1.24 mg/dL   Calcium 8.8 (L) 8.9 - 10.3 mg/dL   GFR, Estimated 35 (L) >60 mL/min   Anion gap 10 5 - 15  Hepatic function panel     Status: Abnormal   Collection Time: 01/20/22  4:02 AM  Result Value Ref Range   Total Protein 6.6 6.5 - 8.1 g/dL   Albumin 3.2 (L) 3.5 - 5.0 g/dL   AST 80 (H) 15 - 41 U/L   ALT 82 (H) 0 - 44 U/L   Alkaline Phosphatase 240 (H) 38 - 126 U/L   Total Bilirubin 1.6 (H) 0.3 - 1.2 mg/dL   Bilirubin, Direct 0.7 (H) 0.0 - 0.2 mg/dL   Indirect Bilirubin 0.9 0.3 - 0.9 mg/dL  Glucose, capillary     Status: None   Collection Time: 01/20/22  7:33 AM  Result Value Ref Range   Glucose-Capillary 74 70 - 99 mg/dL  Glucose, capillary     Status: None   Collection Time: 01/20/22  8:21 AM  Result Value Ref Range   Glucose-Capillary 81 70 - 99 mg/dL  Glucose, capillary     Status: Abnormal   Collection Time: 01/20/22 11:42 AM  Result Value Ref Range   Glucose-Capillary 151 (H) 70 - 99 mg/dL  Glucose, capillary     Status: None   Collection Time: 01/20/22  4:26 PM  Result Value Ref Range   Glucose-Capillary 92 70 - 99 mg/dL   Recent Results (from the past 240 hour(s))  Resp Panel by RT-PCR (Flu A&B, Covid) Anterior Nasal Swab     Status: None   Collection Time: 01/17/22  6:12 PM   Specimen: Anterior Nasal Swab  Result Value Ref Range Status   SARS Coronavirus 2 by RT PCR NEGATIVE NEGATIVE Final    Comment: (NOTE) SARS-CoV-2 target nucleic acids are NOT DETECTED.  The SARS-CoV-2 RNA is generally detectable in upper respiratory specimens during the acute phase of infection. The  lowest concentration of SARS-CoV-2 viral copies this assay can detect is 138 copies/mL. A negative result does not preclude SARS-Cov-2 infection and should not be used as the sole basis for treatment or other patient management decisions. A negative result may occur with  improper specimen collection/handling, submission of specimen other than nasopharyngeal swab, presence of viral mutation(s) within the areas targeted by this assay, and inadequate number of viral copies(<138 copies/mL). A negative result must be combined with clinical observations, patient history, and epidemiological information. The expected result is Negative.  Fact Sheet for Patients:  EntrepreneurPulse.com.au  Fact Sheet for Healthcare Providers:  IncredibleEmployment.be  This test is no t yet approved or cleared by the [place] FDA and  has been authorized for detection and/or diagnosis of SARS-CoV-2 by FDA under an Emergency Use Authorization (EUA). This EUA will remain  in effect (meaning this test can be used) for the duration of the COVID-19 declaration under Section 564(b)(1) of the Act, 21 U.S.C.section 360bbb-3(b)(1), unless the authorization is terminated  or revoked sooner.       Influenza A by PCR NEGATIVE NEGATIVE Final   Influenza B by PCR NEGATIVE NEGATIVE Final    Comment: (NOTE) The Xpert Xpress SARS-CoV-2/FLU/RSV plus assay is intended as an aid in the diagnosis of influenza from Nasopharyngeal swab specimens and should not be used as a sole basis for treatment. Nasal washings and aspirates are unacceptable for Xpert Xpress  SARS-CoV-2/FLU/RSV testing.  Fact Sheet for Patients: EntrepreneurPulse.com.au  Fact Sheet for Healthcare Providers: IncredibleEmployment.be  This test is not yet approved or cleared by the [place] FDA and has been authorized for detection and/or diagnosis of SARS-CoV-2 by FDA under  an Emergency Use Authorization (EUA). This EUA will remain in effect (meaning this test can be used) for the duration of the COVID-19 declaration under Section 564(b)(1) of the Act, 21 U.S.C. section 360bbb-3(b)(1), unless the authorization is terminated or revoked.  Performed at [facility], [place] [address] [place], [place] [address]     Renal Function: Recent Labs    01/19/2022 1406 01/17/22 0412 01/18/22 0334 01/19/22 0337 01/20/22 0402  CREATININE 3.00* 2.73* 2.13* 1.82* 1.90*   Estimated Creatinine Clearance: 30 mL/min (A) (by C-G formula based on SCr of 1.9 mg/dL (H)).  Radiologic Imaging: CT ABDOMEN PELVIS W CONTRAST  Result Date: 01/20/2022 CLINICAL DATA:  Hematuria. Increased risk of urinary tract malignancy, knee urologic evaluation for urological malignancy. * Tracking Code: BO *. EXAM: CT ABDOMEN AND PELVIS WITH CONTRAST TECHNIQUE: Multidetector CT imaging of the abdomen and pelvis was performed using the standard protocol following bolus administration of intravenous contrast. RADIATION DOSE REDUCTION: This exam was performed according to the departmental dose-optimization program which includes automated exposure control, adjustment of the mA and/or kV according to patient size and/or use of iterative reconstruction technique. CONTRAST:  46m OMNIPAQUE IOHEXOL 300 MG/ML  SOLN COMPARISON:  Multiple priors including most recent CT January 16, 2022. FINDINGS: Lower chest: Bibasilar atelectasis/scarring. Coronary artery calcifications. Median sternotomy wires. Calcifications aortic valve. Hepatobiliary: Hypoenhancement of the right lobe and medial left lobe of the liver on early postcontrast imaging with equilibration on delayed imaging. No discrete hepatic lesion identified. Gallbladder is unremarkable. No biliary ductal dilation. Pancreas: No pancreatic ductal dilation or evidence of acute inflammation. Spleen: No splenomegaly or focal splenic lesion.  Adrenals/Urinary Tract: Bilateral adrenal glands are within normal limits. No hydronephrosis.  Focal left upper pole caliectasis Hypoenhancing ill-defined masslike area in the left upper pole kidney on image 34/2 demonstrating Hounsfield units of 40 pre contrast administration and 70 postcontrast administration. Slight hypoenhancement of the left kidney in comparison to the right. Nodular heterogeneous enhancement in the left renal pelvis measuring proximally 17 mm on image 44/2. Excreted contrast in the right renal pelvis. Urinary bladder is unremarkable for degree of distension. Stomach/Bowel: Stomach is minimally distended limiting evaluation. Focal wall thickening along the second part of the duodenum. No pathologic dilation of small or large bowel. Colonic diverticulosis without findings of acute diverticulitis. 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 Focal narrowing of the IVC on image 50/6 aortic atherosclerosis. Abdominopelvic collateral vessels are present. Renal veins are not well opacified limiting evaluation. Prominent retroperitoneal lymph nodes for instance a pericaval lymph node measuring 7 mm in short axis on image 26/2 and a portacaval lymph node measuring 5 mm in short axis on image 43/2, stable dating back to July 01, 2020. Reproductive: Prostate is unremarkable. Other: Increased small volume abdominopelvic ascites with mesenteric edema. Nonspecific body wall edema. Musculoskeletal: Multilevel degenerative changes spine. Degenerative change of the bilateral hips. IMPRESSION: 1. Focal narrowing of the mid IVC with fusiform expansion of the intrahepatic IVC with heterogeneous internal enhancement, abdominopelvic collateral vessels are present suggestive of a degree of chronicity, highly suspicious for thrombus either bland or tumoral. Suggest further evaluation with multiphase CTA with and without  contrast. 2. Hypoenhancing ill-defined  masslike area in the left upper pole kidney is nonspecific and may reflect a focal area of pyelonephritis with pertinent alternate differential consideration of a papillary type renal cell carcinoma. Correlation with laboratory values and attention on follow-up imaging suggested. 3. Heterogeneous enhancement in the left renal pelvis, may reflect excreted contrast mixing although a urothelial carcinoma could appears similar. Attention on follow-up imaging versus ureteroscopy suggested. 4. Hypoenhancement of the left kidney which is nonspecific in this complex case possibly related to the ill-defined area in the renal pelvis described above, avascular pathology or infection (pyelonephritis. 5. Hypoenhancement of the right lobe and medial left lobe of the liver on early postcontrast imaging with equilibration on delayed imaging, nonspecific but possibly reflecting perfusional changes related to the above IVC pathology. 6. Increased small volume abdominopelvic ascites with mesenteric edema and body wall edema, suggestive of third spacing. 7. Focal wall thickening along the second part of the duodenum, nonspecific but possibly reflecting duodenitis. 8. Colonic diverticulosis without findings of acute diverticulitis. 9. Prominent retroperitoneal lymph nodes, nonspecific but overall stable dating back to July 01, 2020. 10.  Aortic Atherosclerosis (ICD10-I70.0). These results were called by telephone at the time of interpretation on 01/20/2022 at 4:17 pm to provider [facility] , who verbally acknowledged these results. Electronically Signed   By: Dahlia Bailiff M.D.   On: 01/20/2022 16:24   ECHOCARDIOGRAM COMPLETE  Result Date: 01/19/2022    ECHOCARDIOGRAM REPORT   Patient Name:   WARNELL RASNIC Date of Exam: 01/18/2022 Medical Rec #:  [id number]        Height:       69.0 in Accession #:    [id number]       Weight:       175.9 lb Date of Birth:  Jun 15, 1940        BSA:           1.956 m Patient Age:    58 years         BP:           131/91 mmHg Patient Gender: M                HR:           84 bpm. Exam Location:  Inpatient Procedure: 2D Echo Indications:    abnormal ecg  History:        Patient has prior history of Echocardiogram examinations, most                 recent 11/23/2014. CHF, CAD, Arrythmias:Atrial Fibrillation; Risk                 Factors:Dyslipidemia and Hypertension.  Sonographer:    Harvie Junior Referring Phys: [id number] [place]  Sonographer Comments: Technically difficult study due to poor echo windows. Image acquisition challenging due to uncooperative patient and Image acquisition challenging due to respiratory motion. IMPRESSIONS  1. Poor acoustic windows limit study. Difficult to see endocardium and therefore LVEF Patient also uncooperative. LV function is depressed, probably mildly with mild aneurysmal dilitation of apex and apical akinesis; inferolateral hypokinesis. . Left ventricular diastolic parameters are indeterminate.  2. Right ventricular systolic function was not well visualized. The right ventricular size is not well visualized. There is normal pulmonary artery systolic pressure.  3. No evidence of mitral valve regurgitation.  4. The aortic valve is grossly normal. Aortic valve regurgitation is not visualized. FINDINGS  Left Ventricle: Poor acoustic windows limit study. Difficult to see endocardium and therefore LVEF Patient  also uncooperative. LV function is depressed, probably mildly with mild aneurysmal dilitation of apex and apical akinesis; inferolateral hypokinesis. The left ventricular internal cavity size was normal in size. There is no left ventricular hypertrophy. Left ventricular diastolic parameters are indeterminate. Right Ventricle: The right ventricular size is not well visualized. Right vetricular wall thickness was not assessed. Right ventricular systolic function was not well visualized. There is normal pulmonary artery systolic  pressure. The tricuspid regurgitant velocity is 2.32 m/s, and with an assumed right atrial pressure of 3 mmHg, the estimated right ventricular systolic pressure is 82.9 mmHg. Left Atrium: Left atrial size was normal in size. Right Atrium: Right atrial size was normal in size. Pericardium: There is no evidence of pericardial effusion. Mitral Valve: There is mild thickening of the mitral valve leaflet(s). No evidence of mitral valve regurgitation. Tricuspid Valve: The tricuspid valve is grossly normal. Tricuspid valve regurgitation is mild. Aortic Valve: The aortic valve is grossly normal. Aortic valve regurgitation is not visualized. Aortic valve mean gradient measures 2.0 mmHg. Aortic valve peak gradient measures 3.9 mmHg. Aortic valve area, by VTI measures 1.46 cm. Pulmonic Valve: The pulmonic valve was not well visualized. Pulmonic valve regurgitation is not visualized. Aorta: The aortic root is normal in size and structure. IAS/Shunts: The interatrial septum was not assessed.  LEFT VENTRICLE PLAX 2D LVIDd:         3.70 cm   Diastology LVIDs:         2.50 cm   LV e' medial:  4.90 cm/s LV PW:         0.90 cm   LV e' lateral: 5.33 cm/s LV IVS:        0.90 cm LVOT diam:     2.00 cm LV SV:         22 LV SV Index:   11 LVOT Area:     3.14 cm  RIGHT VENTRICLE RV S prime:     9.03 cm/s LEFT ATRIUM         Index LA diam:    2.60 cm 1.33 cm/m  AORTIC VALVE                    PULMONIC VALVE AV Area (Vmax):    1.68 cm     PV Vmax:       1.03 m/s AV Area (Vmean):   1.69 cm     PV Peak grad:  4.2 mmHg AV Area (VTI):     1.46 cm AV Vmax:           99.10 cm/s AV Vmean:          63.800 cm/s AV VTI:            0.152 m AV Peak Grad:      3.9 mmHg AV Mean Grad:      2.0 mmHg LVOT Vmax:         53.10 cm/s LVOT Vmean:        34.300 cm/s LVOT VTI:          0.071 m LVOT/AV VTI ratio: 0.46  AORTA Ao Root diam: 3.80 cm TRICUSPID VALVE TR Peak grad:   21.5 mmHg TR Vmax:        232.00 cm/s  SHUNTS Systemic VTI:  0.07 m Systemic Diam:  2.00 cm Dorris Carnes MD Electronically signed by Dorris Carnes MD Signature Date/Time: 01/19/2022/11:07:30 AM    Final     I independently reviewed the above imaging studies.  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 Given the mass in the collecting system, we discussed the next steps in his workup would likely include ureteroscopy with resection/biopsy of the mass as an outpatient. From there, definitive treatment would include nephroureterectomy vs nephrectomy depending on findings.   Mr Sirmon is a poor surgical candidate, and the family is understandably hesitant to have him undergo any sort of intervention. We had a frank discussion that if the family were not interested in definitive treatment of the cancer, it may not make sense to perform the ureteroscopy with biopsy.   They would like some time to think about it and will let us know how they would like to proceed. If the patient is discharged before they reach a decision, he can f/u with [place] as an outpatient.  Donald Pore MD 01/20/2022, 5:32 PM  [facility]  Pager: [phone]

## 2022-01-21 ENCOUNTER — Inpatient Hospital Stay (HOSPITAL_COMMUNITY): Payer: Medicare HMO

## 2022-01-21 ENCOUNTER — Encounter (HOSPITAL_COMMUNITY): Payer: Medicare HMO

## 2022-01-21 DIAGNOSIS — R791 Abnormal coagulation profile: Secondary | ICD-10-CM | POA: Diagnosis not present

## 2022-01-21 DIAGNOSIS — I82220 Acute embolism and thrombosis of inferior vena cava: Secondary | ICD-10-CM

## 2022-01-21 DIAGNOSIS — C801 Malignant (primary) neoplasm, unspecified: Secondary | ICD-10-CM

## 2022-01-21 DIAGNOSIS — Z7189 Other specified counseling: Secondary | ICD-10-CM | POA: Diagnosis not present

## 2022-01-21 DIAGNOSIS — R209 Unspecified disturbances of skin sensation: Secondary | ICD-10-CM | POA: Insufficient documentation

## 2022-01-21 DIAGNOSIS — F0390 Unspecified dementia without behavioral disturbance: Secondary | ICD-10-CM

## 2022-01-21 DIAGNOSIS — G9341 Metabolic encephalopathy: Secondary | ICD-10-CM | POA: Diagnosis not present

## 2022-01-21 DIAGNOSIS — F411 Generalized anxiety disorder: Secondary | ICD-10-CM | POA: Diagnosis not present

## 2022-01-21 DIAGNOSIS — E43 Unspecified severe protein-calorie malnutrition: Secondary | ICD-10-CM

## 2022-01-21 DIAGNOSIS — R627 Adult failure to thrive: Secondary | ICD-10-CM | POA: Diagnosis not present

## 2022-01-21 DIAGNOSIS — Z515 Encounter for palliative care: Secondary | ICD-10-CM | POA: Diagnosis not present

## 2022-01-21 DIAGNOSIS — N179 Acute kidney failure, unspecified: Secondary | ICD-10-CM | POA: Diagnosis not present

## 2022-01-21 LAB — GLUCOSE, CAPILLARY
Glucose-Capillary: 76 mg/dL (ref 70–99)
Glucose-Capillary: 134 mg/dL — ABNORMAL HIGH (ref 70–99)
Glucose-Capillary: 151 mg/dL — ABNORMAL HIGH (ref 70–99)
Glucose-Capillary: 90 mg/dL (ref 70–99)

## 2022-01-21 LAB — BASIC METABOLIC PANEL
Anion gap: 11 (ref 5–15)
BUN: 35 mg/dL — ABNORMAL HIGH (ref 8–23)
CO2: 15 mmol/L — ABNORMAL LOW (ref 22–32)
Calcium: 8.7 mg/dL — ABNORMAL LOW (ref 8.9–10.3)
Chloride: 114 mmol/L — ABNORMAL HIGH (ref 98–111)
Creatinine, Ser: 1.93 mg/dL — ABNORMAL HIGH (ref 0.61–1.24)
GFR, Estimated: 34 mL/min — ABNORMAL LOW (ref >60)
Glucose, Bld: 105 mg/dL — ABNORMAL HIGH (ref 70–99)
Potassium: 3.8 mmol/L (ref 3.5–5.1)
Sodium: 140 mmol/L (ref 135–145)

## 2022-01-21 LAB — TROPONIN I (HIGH SENSITIVITY)
Troponin I (High Sensitivity): 181 ng/L
Troponin I (High Sensitivity): 179 ng/L (ref <18)

## 2022-01-21 LAB — CBC
HCT: 39.6 % (ref 39.0–52.0)
Hemoglobin: 12.6 g/dL — ABNORMAL LOW (ref 13.0–17.0)
MCH: 27 pg (ref 26.0–34.0)
MCHC: 31.8 g/dL (ref 30.0–36.0)
MCV: 84.8 fL (ref 80.0–100.0)
Platelets: 226 10*3/uL (ref 150–400)
RBC: 4.67 MIL/uL (ref 4.22–5.81)
RDW: 18.3 % — ABNORMAL HIGH (ref 11.5–15.5)
WBC: 9.7 10*3/uL (ref 4.0–10.5)
nRBC: 0 % (ref 0.0–0.2)

## 2022-01-21 LAB — PROTIME-INR
INR: 4.2 (ref 0.8–1.2)
Prothrombin Time: 40.4 s — ABNORMAL HIGH (ref 11.4–15.2)

## 2022-01-21 MED ORDER — ORAL CARE MOUTH RINSE
15.0000 mL | OROMUCOSAL | Status: DC
  Administered 2022-01-21 – 2022-01-26 (×5): 15 mL via OROMUCOSAL

## 2022-01-21 MED ORDER — ORAL CARE MOUTH RINSE: 15.0000 mL | OROMUCOSAL | Status: DC | PRN

## 2022-01-21 MED ORDER — AMLODIPINE BESYLATE 5 MG PO TABS
5.0000 mg | ORAL_TABLET | Freq: Every day | ORAL | Status: DC
  Administered 2022-01-21 – 2022-01-27 (×7): 5 mg via ORAL
  Filled 2022-01-21 (×7): qty 1

## 2022-01-21 MED ORDER — NITROGLYCERIN 0.4 MG SL SUBL: 0.4000 mg | SUBLINGUAL_TABLET | SUBLINGUAL | Status: DC | PRN

## 2022-01-21 NOTE — Progress Notes (Signed)
Patient c/o chest pn after moving from chair to bed.  BP 145/78, HR 95, SpO2 98.  Attempted to perform EKG and repeat VS, patient refused and became agitated.  Patient's legs appear mottled and cold to touch, pulses 1+.  MD notified and at bedside, new orders placed.  Patient appears comfortable in bed but continues to verbally endorse chest pain.  Angie Fava, RN

## 2022-01-21 NOTE — Progress Notes (Signed)
PROGRESS NOTE    Albert Short  HGD:[id number] DOB: [birth year] DOA: 01/14/2022 PCP: Leeroy Cha, MD   Brief Narrative: Albert Short is a 81 y.o. male with a history of atrial fibrillation on Coumadin, CAD, diastolic heart failure, hyperlipidemia and diabetes mellitus. He presented secondary to progressive generalized decline and weight loss of several months and admitted for AKI. 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 Urology and palliative care consulted. Admission complicated by delirium.   Assessment and Plan: * Acute renal failure superimposed on stage 3b chronic kidney disease (HCC) Baseline creatinine is about 1.4-1.9 from most recent labs. Creatinine of 3.00 on admission and has improved with IV fluids down to 1.82. Slight uptrend. IV fluids held. -BMP in AM  Acute metabolic encephalopathy Unclear etiology. No formal diagnosis of dementia, but patient has a history of progressive memory loss. Patient started on Seroquel while inpatient. -Continue Seroquel for now -Delirium precautions -Address pain, toilet needs, family at bedside when able, etc.  FTT (failure to thrive) in adult Progressive. Possibly related to new concern for renal cell cancer vs demential. Palliative care consulted. On-going goals of care discussions.  Bilateral cold feet Patient would not let me examine pulses this morning. 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 Reimaging complicated by underlying AKI on CKD. Will discontinue repeat CT scan at this time and consider alternatives pending continued goals of care discussions. Patient is on Coumadin chronically for atrial fibrillation.  Acute metabolic acidosis Secondary to hypochloridemia. Resolved.  Likely dementia Noted. Depending on goals of care, patient would benefit from Neurology referral.  Protein-calorie  malnutrition, severe Dietitian recommendations (11/15): Continue regular diet as tolerated Provide daily MVI for taste changes and poor appetite Provide Ensure Enlive or Ensure Plus HP BID (350kcal, 20g protein/bottle) Provide meal preferences and assist with meal setup to optimize po intake  Supratherapeutic INR Patient treated with vitamin K on initial presentation secondary to associated hematuria. INR improved but has now trended back up. No evidence of concurrent hemorrhage at this time. -Continue Coumadin per pharmacy -Repeat vitamin K if hemorrhaging noted  Essential hypertension Some uncontrolled hypertension. Patient is on metoprolol, amlodipine, clonidine, hydrochlorothiazide  and losartan as an outpatient. Losartan, hydrochlorothiazide, amlodipine and clonidine held. -Continue metoprolol -Start amlodipine 5 mg daily  Chronic systolic CHF (congestive heart failure) (HCC) Stable. Losartan held secondary to AKI. -Continue metoprolol  DM type 2 (diabetes mellitus, type 2) (HCC) Hemoglobin A1C of 6.5%. Patient is managed on metformin as an outpatient. -Continue SSI  CAD (coronary artery disease) History of CABG. Patient now with chest pain without dyspnea. Troponin up to 181 today. It is possible this could be related to heart failure after diuresis. Recent Transthoracic Echocardiogram was of poor quality secondary to uncooperative patient and respiratory motion. -EKG -Repeat troponin -Chest x-ray  Hypercholesterolemia Patient is prescribed simvastatin but noted to be discontinued.  Paroxysmal atrial fibrillation (HCC) Currently in sinus rhythm. Patient is managed on metoprolol and Coumadin. -Continue metoprolol -Continue Coumadin  Hypokalemia-resolved as of 01/21/2022 Resolved with supplementation    DVT prophylaxis: Coumadin Code Status:   Code Status: Full Code Family Communication: Wife and two daughters at bedside Disposition Plan: Discharge pending stable  creatinine and mental status vs continued goals of care discussions   Consultants:  Palliative care medicine Urology  Procedures:  Transthoracic Echocardiogram  Antimicrobials: None    Subjective: Patient this morning reports  no issues  Objective: BP (!) 124/58 (BP Location: Right Arm)   Pulse 84   Temp (!) 97.3 F (36.3 C) (Axillary)   Resp 17   Ht '5\' 9"'$  (1.753 m)   Wt 81.5 kg   SpO2 96%   BMI 26.53 kg/m   Examination:  General exam: Appears calm and comfortable Respiratory system: Clear to auscultation. Respiratory effort normal. Cardiovascular system: S1 & S2 heard, RRR. Bilateral cold feet. Gastrointestinal system: Abdomen is nondistended, soft and nontender. Normal bowel sounds heard. Central nervous system: Alert and not oriented. Musculoskeletal: No edema. No calf tenderness Skin: No cyanosis. No rashes Psychiatry: Judgement and insight appear normal. Mood & affect appropriate.    Data Reviewed: I have personally reviewed following labs and imaging studies  CBC Lab Results  Component Value Date   WBC 9.7 01/21/2022   RBC 4.67 01/21/2022   HGB 12.6 (L) 01/21/2022   HCT 39.6 01/21/2022   MCV 84.8 01/21/2022   MCH 27.0 01/21/2022   PLT 226 01/21/2022   MCHC 31.8 01/21/2022   RDW 18.3 (H) 01/21/2022   LYMPHSABS 2.2 01/27/2022   MONOABS 0.9 01/20/2022   EOSABS 0.0 01/11/2022   BASOSABS 0.0 40/81/4481     Last metabolic panel Lab Results  Component Value Date   NA 140 01/21/2022   K 3.8 01/21/2022   CL 114 (H) 01/21/2022   CO2 15 (L) 01/21/2022   BUN 35 (H) 01/21/2022   CREATININE 1.93 (H) 01/21/2022   GLUCOSE 105 (H) 01/21/2022   GFRNONAA 34 (L) 01/21/2022   GFRAA 53 (L) 05/05/2019   CALCIUM 8.7 (L) 01/21/2022   PROT 6.6 01/20/2022   ALBUMIN 3.2 (L) 01/20/2022   BILITOT 1.6 (H) 01/20/2022   ALKPHOS 240 (H) 01/20/2022   AST 80 (H) 01/20/2022   ALT 82 (H) 01/20/2022   ANIONGAP 11 01/21/2022    GFR: Estimated Creatinine Clearance:  29.5 mL/min (A) (by C-G formula based on SCr of 1.93 mg/dL (H)).  Recent Results (from the past 240 hour(s))  Resp Panel by RT-PCR (Flu A&B, Covid) Anterior Nasal Swab     Status: None   Collection Time: 01/17/22  6:12 PM   Specimen: Anterior Nasal Swab  Result Value Ref Range Status   SARS Coronavirus 2 by RT PCR NEGATIVE NEGATIVE Final    Comment: (NOTE) SARS-CoV-2 target nucleic acids are NOT DETECTED.  The SARS-CoV-2 RNA is generally detectable in upper respiratory specimens during the acute phase of infection. The lowest concentration of SARS-CoV-2 viral copies this assay can detect is 138 copies/mL. A negative result does not preclude SARS-Cov-2 infection and should not be used as the sole basis for treatment or other patient management decisions. A negative result may occur with  improper specimen collection/handling, submission of specimen other than nasopharyngeal swab, presence of viral mutation(s) within the areas targeted by this assay, and inadequate number of viral copies(<138 copies/mL). A negative result must be combined with clinical observations, patient history, and epidemiological information. The expected result is Negative.  Fact Sheet for Patients:  EntrepreneurPulse.com.au  Fact Sheet for Healthcare Providers:  IncredibleEmployment.be  This test is no t yet approved or cleared by the [place] FDA and  has been authorized for detection and/or diagnosis of SARS-CoV-2 by FDA under an Emergency Use Authorization (EUA). This EUA will remain  in effect (meaning this test can be used) for the duration of the COVID-19 declaration under Section 564(b)(1) of the Act, 21 U.S.C.section 360bbb-3(b)(1), unless the authorization is terminated  or  revoked sooner.       Influenza A by PCR NEGATIVE NEGATIVE Final   Influenza B by PCR NEGATIVE NEGATIVE Final    Comment: (NOTE) The Xpert Xpress SARS-CoV-2/FLU/RSV plus assay is  intended as an aid in the diagnosis of influenza from Nasopharyngeal swab specimens and should not be used as a sole basis for treatment. Nasal washings and aspirates are unacceptable for Xpert Xpress SARS-CoV-2/FLU/RSV testing.  Fact Sheet for Patients: EntrepreneurPulse.com.au  Fact Sheet for Healthcare Providers: IncredibleEmployment.be  This test is not yet approved or cleared by the [place] FDA and has been authorized for detection and/or diagnosis of SARS-CoV-2 by FDA under an Emergency Use Authorization (EUA). This EUA will remain in effect (meaning this test can be used) for the duration of the COVID-19 declaration under Section 564(b)(1) of the Act, 21 U.S.C. section 360bbb-3(b)(1), unless the authorization is terminated or revoked.  Performed at [facility], [place] [address] [place], [place] [address]       Radiology Studies: CT ABDOMEN PELVIS W CONTRAST  Result Date: 01/20/2022 CLINICAL DATA:  Hematuria. Increased risk of urinary tract malignancy, knee urologic evaluation for urological malignancy. * Tracking Code: BO *. EXAM: CT ABDOMEN AND PELVIS WITH CONTRAST TECHNIQUE: Multidetector CT imaging of the abdomen and pelvis was performed using the standard protocol following bolus administration of intravenous contrast. RADIATION DOSE REDUCTION: This exam was performed according to the departmental dose-optimization program which includes automated exposure control, adjustment of the mA and/or kV according to patient size and/or use of iterative reconstruction technique. CONTRAST:  13m OMNIPAQUE IOHEXOL 300 MG/ML  SOLN COMPARISON:  Multiple priors including most recent CT January 16, 2022. FINDINGS: Lower chest: Bibasilar atelectasis/scarring. Coronary artery calcifications. Median sternotomy wires. Calcifications aortic valve. Hepatobiliary: Hypoenhancement of the right lobe and medial left lobe of the liver on  early postcontrast imaging with equilibration on delayed imaging. No discrete hepatic lesion identified. Gallbladder is unremarkable. No biliary ductal dilation. Pancreas: No pancreatic ductal dilation or evidence of acute inflammation. Spleen: No splenomegaly or focal splenic lesion. Adrenals/Urinary Tract: Bilateral adrenal glands are within normal limits. No hydronephrosis.  Focal left upper pole caliectasis Hypoenhancing ill-defined masslike area in the left upper pole kidney on image 34/2 demonstrating Hounsfield units of 40 pre contrast administration and 70 postcontrast administration. Slight hypoenhancement of the left kidney in comparison to the right. Nodular heterogeneous enhancement in the left renal pelvis measuring proximally 17 mm on image 44/2. Excreted contrast in the right renal pelvis. Urinary bladder is unremarkable for degree of distension. Stomach/Bowel: Stomach is minimally distended limiting evaluation. Focal wall thickening along the second part of the duodenum. No pathologic dilation of small or large bowel. Colonic diverticulosis without findings of acute diverticulitis. 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 Focal narrowing of the IVC on image 50/6 aortic atherosclerosis. Abdominopelvic collateral vessels are present. Renal veins are not well opacified limiting evaluation. Prominent retroperitoneal lymph nodes for instance a pericaval lymph node measuring 7 mm in short axis on image 26/2 and a portacaval lymph node measuring 5 mm in short axis on image 43/2, stable dating back to July 01, 2020. Reproductive: Prostate is unremarkable. Other: Increased small volume abdominopelvic ascites with mesenteric edema. Nonspecific body wall edema. Musculoskeletal: Multilevel degenerative changes spine. Degenerative change of the bilateral hips. IMPRESSION: 1. Focal narrowing of the mid IVC with  fusiform expansion of the intrahepatic IVC with heterogeneous internal enhancement, abdominopelvic collateral vessels are  present suggestive of a degree of chronicity, highly suspicious for thrombus either bland or tumoral. Suggest further evaluation with multiphase CTA with and without contrast. 2. Hypoenhancing ill-defined masslike area in the left upper pole kidney is nonspecific and may reflect a focal area of pyelonephritis with pertinent alternate differential consideration of a papillary type renal cell carcinoma. Correlation with laboratory values and attention on follow-up imaging suggested. 3. Heterogeneous enhancement in the left renal pelvis, may reflect excreted contrast mixing although a urothelial carcinoma could appears similar. Attention on follow-up imaging versus ureteroscopy suggested. 4. Hypoenhancement of the left kidney which is nonspecific in this complex case possibly related to the ill-defined area in the renal pelvis described above, avascular pathology or infection (pyelonephritis. 5. Hypoenhancement of the right lobe and medial left lobe of the liver on early postcontrast imaging with equilibration on delayed imaging, nonspecific but possibly reflecting perfusional changes related to the above IVC pathology. 6. Increased small volume abdominopelvic ascites with mesenteric edema and body wall edema, suggestive of third spacing. 7. Focal wall thickening along the second part of the duodenum, nonspecific but possibly reflecting duodenitis. 8. Colonic diverticulosis without findings of acute diverticulitis. 9. Prominent retroperitoneal lymph nodes, nonspecific but overall stable dating back to July 01, 2020. 10.  Aortic Atherosclerosis (ICD10-I70.0). These results were called by telephone at the time of interpretation on 01/20/2022 at 4:17 pm to provider [facility] , who verbally acknowledged these results. Electronically Signed   By: Dahlia Bailiff M.D.   On: 01/20/2022 16:24       LOS: 4 days    Cordelia Poche, MD [facility] 01/21/2022, 2:04 PM   If 7PM-7AM, please contact night-coverage [email or web address]

## 2022-01-21 NOTE — Progress Notes (Signed)
ANTICOAGULATION CONSULT NOTE  Pharmacy Consult for warfarin Indication: atrial fibrillation  No Known Allergies  Patient Measurements: Height: '5\' 9"'$  (175.3 cm) Weight: 81.5 kg (179 lb 10.8 oz) IBW/kg (Calculated) : 70.7  Vital Signs: Temp: 97.3 F (36.3 C) (11/18 1100) Temp Source: Axillary (11/18 1100) BP: 124/58 (11/18 1008) Pulse Rate: 84 (11/18 1008)  Labs: Recent Labs    01/19/22 0337 01/20/22 0402 01/21/22 0637  HGB 13.2  --  12.6*  HCT 42.8  --  39.6  PLT 187  --  226  LABPROT 21.9* 29.6* 40.4*  INR 1.9* 2.8* 4.2*  CREATININE 1.82* 1.90* 1.93*     Estimated Creatinine Clearance: 29.5 mL/min (A) (by C-G formula based on SCr of 1.93 mg/dL (H)).   Assessment: 51 yoM with PMH Afib on warfarin PTA, CHF, CAD, DM2, HTN, admitted 11/13 for weakness, weight loss, and general decline. Noted to have elevated INR and SCr on admission. Given Vitamin K 10 mg IV x 1, and Pharmacy consulted to dose warfarin once appropriate.  Baseline INR markedly elevated, likely d/t recent poor PO intake/weight loss (down ~16 kg since this time last year) Prior anticoagulation: Med history incomplete; fill history suggests 5 mg daily (5 mg x 90 tabs for 90 day supply, filled 11/1)  Significant events: - 11/13: Vit K '10mg'$  IV x 1 - 11/14: warfarin dose not given  Today, 01/21/2022: INR supratherapeutic at 4.2,    significant rise from yesterday CBC on 11/16: Hgb stable; Plt stable WNL Major drug interactions: none No bleeding issues documented  Goal of Therapy: INR 2-3  Plan: No warfarin dose for today  Daily INR Monitor for signs of bleeding or thrombosis    Royetta Asal, PharmD, BCPS 01/21/2022 12:35 PM

## 2022-01-21 NOTE — Progress Notes (Signed)
   01/21/22 3685  Provider Notification  Provider Name/Title Cordelia Poche, MD  Date Provider Notified 01/21/22  Time Provider Notified [phone]  Method of Notification Page  Notification Reason Critical Result  Test performed and critical result INR 4.2  Date Critical Result Received 01/21/22  Time Critical Result Received [phone]

## 2022-01-21 NOTE — Progress Notes (Signed)
Patient refused lunch and dinner, does not take more than a sip of fluids when offered.  Angie Fava, RN

## 2022-01-21 NOTE — Progress Notes (Signed)
Daily Progress Note   Patient Name: Albert Short       Date: 01/21/2022 DOB: 1941-01-15  Age: 81 y.o. MRN#: [id number] Attending Physician: Mariel Aloe, MD Primary Care Physician: Leeroy Cha, MD Admit Date: 01/15/2022  Reason for Consultation/Follow-up: Establishing goals of care  Subjective: Patient is sitting in a chair, not oriented however is able to answer a few questions appropriately, denies pain.  Return later to the bedside when his wife and daughter were visiting, discussions were held outside the room-see below Length of Stay: 4  Current Medications: Scheduled Meds:   docusate sodium  100 mg Oral Daily   feeding supplement  237 mL Oral BID BM   insulin aspart  0-9 Units Subcutaneous TID WC   metoprolol tartrate  25 mg Oral BID   multivitamin with minerals  1 tablet Oral Daily   QUEtiapine  25 mg Oral QHS   sodium chloride flush  3 mL Intravenous Q12H   Warfarin - Pharmacist Dosing Inpatient   Does not apply q1600    Continuous Infusions:    PRN Meds: acetaminophen **OR** acetaminophen, hydrALAZINE, mouth rinse, polyethylene glycol, traMADol  Physical Exam         Awake alert resting in chair Not agitated no acute distress Answers a few questions appropriately  follows commands Regular work of breathing No edema  Vital Signs: BP (!) 124/58 (BP Location: Right Arm)   Pulse 84   Temp (!) 97.3 F (36.3 C) (Axillary)   Resp 17   Ht '5\' 9"'$  (1.753 m)   Wt 81.5 kg   SpO2 96%   BMI 26.53 kg/m  SpO2: SpO2:  (Skin is cold to touch, patient states they are very cold) O2 Device: O2 Device: Room Air O2 Flow Rate:    Intake/output summary:  Intake/Output Summary (Last 24 hours) at 01/21/2022 1310 Last data filed at 01/21/2022 1259 Gross per 24  hour  Intake 362.49 ml  Output 900 ml  Net -537.51 ml    LBM: Last BM Date : 01/18/22 Baseline Weight: Weight: 79.8 kg Most recent weight: Weight: 81.5 kg       Palliative Assessment/Data:      Patient Active Problem List   Diagnosis Date Noted   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[place]) 74/93/5521   Acute metabolic acidosis 74/71/5953   Hypokalemia 01/19/2022  Protein-calorie malnutrition, severe 01/18/2022   Likely dementia 44/31/5400   Acute metabolic encephalopathy 86/76/1950   FTT (failure to thrive) in adult 01/08/2022   Supratherapeutic INR 01/22/2022   Essential hypertension 05/21/2019   Hyponatremia 11/23/2014   Acute renal failure superimposed on stage 3b chronic kidney disease ([place]) 11/23/2014   UTI (lower urinary tract infection) 93/26/7124   Chronic systolic CHF (congestive heart failure) ([place]) 11/23/2014   Chest pain 11/22/2014   Paroxysmal atrial fibrillation (HCC)    Old myocardial infarct    Hypercholesterolemia    Obesity    Diverticulosis    GERD (gastroesophageal reflux disease)    CAD (coronary artery disease)    DM type 2 (diabetes mellitus, type 2) (HCC)     Palliative Care Assessment & Plan   Patient Profile:    Assessment: 81 year old gentleman with A-fib coronary artery disease heart failure dyslipidemia and diabetes who is admitted to hospital medicine service because of generalized functional decline weight loss memory loss and is admitted with acute kidney injury, underlying [place] stage III chronic kidney disease, failure to thrive, hospital course complicated by delirium and ongoing severe protein calorie malnutrition.  Recommendations/Plan:  Chart reviewed, events over the course of the past 24 hours noted, CT scan suspicious for left renal cancer renal cell versus urothelial.  Also found to have thrombus in IVC, noted to have a mass upper pole of left kidney and patient was evaluated by urology on 01-20-2022.    It appears that the patient has either  urothelial or renal cell carcinoma at this time and next steps include ureteroscopy with resection/biopsy of the mass as an outpatient, however, patient is noted to be a poor surgical candidate.    Discussed with patient's wife and daughter in great detail, to the best of my ability about patient's current diagnosis of serious illness of cancer urothelial versus renal cell.  Discussed about next steps from urology standpoint as well as broad goals of care, wishes and preferences attempted to be explored and outlined.    Patient's daughter and wife state that the patient has had gradual progressive functional decline for the past few months now.  They believe that the patient would want to be home and would want to be kept comfortable.  They are wondering whether to proceed with ureteroscopy with biopsy or not and they are wondering whether it would be most feasible to take the patient home with hospice services.  However, for the weekend, they would like to discuss amongst each other as a family about the right kind of approach and the next best steps to take keeping in mind the patient's wishes.    Years ago, the patient had completed advance care planning documents in which he outlined that he wanted to be kept alive by artificial means.  Certainly, he has a MOST form available in the computer indicating full code/full scope care.  We compared and contrasted previously completed advance care planning documents to what the patient's current condition is likely now.  I put forth my medical recommendation for consideration for DNR/DNI.  PMT to follow-up again on 01-23-2022 for continuation of CODE STATUS and broad goals of care discussions.  Offered active listening, supportive empathic presence and other therapeutic techniques at the time of this family meeting today.     Goals of Care and Additional Recommendations: Limitations on Scope of Treatment: Full Scope Treatment  Code Status:    Code Status  Orders  (From admission, onward)  Start     Ordered   01/13/2022 1857  Full code  Continuous        01/08/2022 1859           Code Status History     Date Active Date Inactive Code Status Order ID Comments User Context   11/22/2014 2237 11/24/2014 1546 Full Code [id number]  Lavina Hamman, MD ED       Prognosis:  Unable to determine  Discharge Planning: To Be Determined  Care plan was discussed with patient and family who are at the bedside  Thank you for allowing the Palliative Medicine Team to assist in the care of this patient.  High MDM.     Greater than 50%  of this time was spent counseling and coordinating care related to the above assessment and plan.  Loistine Chance, MD  Please contact Palliative Medicine Team phone at [phone] for questions and concerns.

## 2022-01-21 NOTE — Progress Notes (Signed)
   01/21/22 1525  Provider Notification  Provider Name/Title Cordelia Poche, MD  Date Provider Notified 01/21/22  Time Provider Notified 1525  Method of Notification Page  Notification Reason Critical Result  Test performed and critical result Troponin high sens 179  Date Critical Result Received 01/21/22  Time Critical Result Received 1526

## 2022-01-21 NOTE — Assessment & Plan Note (Signed)
Patient would not let me examine pulses this morning. -ABIs

## 2022-01-21 NOTE — Progress Notes (Signed)
Patient more alert and able to engage in conversation this morning compared to yesterday.  AAOx2.  Required minimal assist from RN with walker to transfer from bed to chair.  Fed himself 75% breakfast of eggs, sausage, and fruit, drank a cup of Coke and a cup of coffee.  Angie Fava, RN

## 2022-01-21 NOTE — Progress Notes (Signed)
   01/21/22 1327  Provider Notification  Provider Name/Title Cordelia Poche, MD  Date Provider Notified 01/21/22  Time Provider Notified 1327  Method of Notification Page  Notification Reason Critical Result  Test performed and critical result Troponin high sensitivity 181  Date Critical Result Received 01/21/22  Time Critical Result Received 1326

## 2022-01-22 ENCOUNTER — Encounter (HOSPITAL_COMMUNITY): Payer: Medicare HMO

## 2022-01-22 DIAGNOSIS — N179 Acute kidney failure, unspecified: Secondary | ICD-10-CM | POA: Diagnosis not present

## 2022-01-22 DIAGNOSIS — G9341 Metabolic encephalopathy: Secondary | ICD-10-CM | POA: Diagnosis not present

## 2022-01-22 DIAGNOSIS — Z7189 Other specified counseling: Secondary | ICD-10-CM | POA: Diagnosis not present

## 2022-01-22 DIAGNOSIS — R531 Weakness: Secondary | ICD-10-CM

## 2022-01-22 DIAGNOSIS — Z515 Encounter for palliative care: Secondary | ICD-10-CM | POA: Diagnosis not present

## 2022-01-22 DIAGNOSIS — I10 Essential (primary) hypertension: Secondary | ICD-10-CM | POA: Diagnosis not present

## 2022-01-22 DIAGNOSIS — R627 Adult failure to thrive: Secondary | ICD-10-CM | POA: Diagnosis not present

## 2022-01-22 DIAGNOSIS — R7989 Other specified abnormal findings of blood chemistry: Secondary | ICD-10-CM | POA: Insufficient documentation

## 2022-01-22 LAB — CBC
HCT: 39.6 % (ref 39.0–52.0)
Hemoglobin: 12.3 g/dL — ABNORMAL LOW (ref 13.0–17.0)
MCH: 26.3 pg (ref 26.0–34.0)
MCHC: 31.1 g/dL (ref 30.0–36.0)
MCV: 84.6 fL (ref 80.0–100.0)
Platelets: 194 10*3/uL (ref 150–400)
RBC: 4.68 MIL/uL (ref 4.22–5.81)
RDW: 18.7 % — ABNORMAL HIGH (ref 11.5–15.5)
WBC: 8.8 10*3/uL (ref 4.0–10.5)
nRBC: 0 % (ref 0.0–0.2)

## 2022-01-22 LAB — BASIC METABOLIC PANEL
Anion gap: 8 (ref 5–15)
BUN: 40 mg/dL — ABNORMAL HIGH (ref 8–23)
CO2: 21 mmol/L — ABNORMAL LOW (ref 22–32)
Calcium: 9 mg/dL (ref 8.9–10.3)
Chloride: 114 mmol/L — ABNORMAL HIGH (ref 98–111)
Creatinine, Ser: 2.14 mg/dL — ABNORMAL HIGH (ref 0.61–1.24)
GFR, Estimated: 30 mL/min — ABNORMAL LOW (ref >60)
Glucose, Bld: 89 mg/dL (ref 70–99)
Potassium: 4.1 mmol/L (ref 3.5–5.1)
Sodium: 143 mmol/L (ref 135–145)

## 2022-01-22 LAB — PROTIME-INR
INR: 4.7 (ref 0.8–1.2)
Prothrombin Time: 44.2 seconds — ABNORMAL HIGH (ref 11.4–15.2)

## 2022-01-22 LAB — GLUCOSE, CAPILLARY
Glucose-Capillary: 73 mg/dL (ref 70–99)
Glucose-Capillary: 76 mg/dL (ref 70–99)
Glucose-Capillary: 133 mg/dL — ABNORMAL HIGH (ref 70–99)

## 2022-01-22 MED ORDER — SODIUM CHLORIDE 0.9 % IV SOLN: INTRAVENOUS | Status: DC

## 2022-01-22 MED ORDER — QUETIAPINE FUMARATE 25 MG PO TABS
12.5000 mg | ORAL_TABLET | Freq: Every day | ORAL | Status: DC
  Administered 2022-01-22: 12.5 mg via ORAL
  Filled 2022-01-22: qty 1

## 2022-01-22 NOTE — Progress Notes (Signed)
[place] for warfarin Indication: atrial fibrillation  No Known Allergies  Patient Measurements: Height: '5\' 9"'$  (175.3 cm) Weight: 82.1 kg (181 lb) IBW/kg (Calculated) : 70.7  Vital Signs: Temp: 98.8 F (37.1 C) (11/19 0515) Temp Source: Axillary (11/19 0515) BP: 115/87 (11/19 0515) Pulse Rate: 75 (11/19 0515)  Labs: Recent Labs    01/20/22 0402 01/21/22 0637 01/21/22 1225 01/21/22 1424 01/22/22 0624  HGB  --  12.6*  --   --  12.3*  HCT  --  39.6  --   --  39.6  PLT  --  226  --   --  194  LABPROT 29.6* 40.4*  --   --  44.2*  INR 2.8* 4.2*  --   --  4.7*  CREATININE 1.90* 1.93*  --   --  2.14*  TROPONINIHS  --   --  181* 179*  --      Estimated Creatinine Clearance: 26.6 mL/min (A) (by C-G formula based on SCr of 2.14 mg/dL (H)).   Assessment: 1 yoM with PMH Afib on warfarin PTA, CHF, CAD, DM2, HTN, admitted 11/13 for weakness, weight loss, and general decline. Noted to have elevated INR and SCr on admission. Given Vitamin K 10 mg IV x 1, and Pharmacy consulted to dose warfarin once appropriate.  Baseline INR markedly elevated, likely d/t recent poor PO intake/weight loss (down ~16 kg since this time last year) Prior anticoagulation: Med history incomplete; fill history suggests 5 mg daily (5 mg x 90 tabs for 90 day supply, filled 11/1)  Significant events: - 11/13: Vit K '10mg'$  IV x 1 - 11/14: warfarin dose not given  Today, 01/22/2022: INR supratherapeutic at 4.7, continued to trend up      CBC on 11/16: Hgb stable; Plt stable WNL Major drug interactions: none No bleeding issues documented  Goal of Therapy: INR 2-3  Plan: No warfarin dose for today  Daily INR Monitor for signs of bleeding or thrombosis    Royetta Asal, PharmD, BCPS 01/22/2022 9:56 AM

## 2022-01-22 NOTE — Progress Notes (Signed)
PROGRESS NOTE    JARAD BARTH  KWI:[id number] DOB: 10-Jul-1940 DOA: 02/02/2022 PCP: Leeroy Cha, MD   Brief Narrative: Albert Short is a 81 y.o. male with a history of atrial fibrillation on Coumadin, CAD, diastolic heart failure, hyperlipidemia and diabetes mellitus. He presented secondary to progressive generalized decline and weight loss of several months and admitted for AKI. 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 Urology and palliative care consulted. Admission complicated by delirium.   Assessment and Plan: * Acute renal failure superimposed on stage 3b chronic kidney disease (HCC) Baseline creatinine is about 1.4-1.9 from most recent labs. Creatinine of 3.00 on admission and has improved with IV fluids down to 1.82. Creatinine now worsening again. -Restart IV fluids  Acute metabolic encephalopathy Unclear etiology. No formal diagnosis of dementia, but patient has a history of progressive memory loss. Patient started on Seroquel while inpatient. -Continue Seroquel for now -Delirium precautions -Address pain, toilet needs, family at bedside when able, etc.  FTT (failure to thrive) in adult Progressive. Possibly related to new concern for renal cell cancer vs demential. Palliative care consulted. On-going goals of care discussions.  Elevated troponin Likely demand ischemia. No new significant EKG findings. Patient with self-limited chest pain symptoms. Per wife, patient has intermittent chest pain requiring nitroglycerin at home. Troponin elevated but flat, also not consistent with ACS.  Bilateral cold feet Patient would not let me examine pulses this morning. 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 Reimaging complicated by underlying AKI on CKD. Will discontinue repeat CT scan at this time and consider alternatives pending continued goals of  care discussions. Patient is on Coumadin chronically for atrial fibrillation.  Acute metabolic acidosis Secondary to hypochloridemia. improved.  Likely dementia Noted. Depending on goals of care, patient would benefit from Neurology referral.  Protein-calorie malnutrition, severe Dietitian recommendations (11/15): Continue regular diet as tolerated Provide daily MVI for taste changes and poor appetite Provide Ensure Enlive or Ensure Plus HP BID (350kcal, 20g protein/bottle) Provide meal preferences and assist with meal setup to optimize po intake  Supratherapeutic INR Patient treated with vitamin K on initial presentation secondary to associated hematuria. INR improved but has now trended back up. No evidence of concurrent hemorrhage at this time. Hemoglobin stable. -Continue Coumadin per pharmacy -Repeat vitamin K if hemorrhaging noted  Essential hypertension Some uncontrolled hypertension. Patient is on metoprolol, amlodipine, clonidine, hydrochlorothiazide  and losartan as an outpatient. Losartan, hydrochlorothiazide, amlodipine and clonidine held. -Continue metoprolol -Continue amlodipine 5 mg daily  Chronic systolic CHF (congestive heart failure) (HCC) Stable. Losartan held secondary to AKI. -Continue metoprolol  DM type 2 (diabetes mellitus, type 2) (HCC) Hemoglobin A1C of 6.5%. Patient is managed on metformin as an outpatient. -Continue SSI  CAD (coronary artery disease) History of CABG. Patient now with chest pain without dyspnea. Troponin up to 181 today. It is possible this could be related to heart failure after diuresis. Recent Transthoracic Echocardiogram was of poor quality secondary to uncooperative patient and respiratory motion. Episode of chest pain not consistent with ACS. Possible angina as patient has history of nitroglycerin use at home.  Hypercholesterolemia Patient is prescribed simvastatin but noted to be discontinued.  Paroxysmal atrial fibrillation  (HCC) Currently in sinus rhythm. Patient is managed on metoprolol and Coumadin. -Continue metoprolol -Continue Coumadin  Hypokalemia-resolved as of 01/21/2022 Resolved with supplementation    DVT prophylaxis: Coumadin Code Status:  Code Status: Full Code Family Communication: Wife and two daughter at bedside Disposition Plan: Discharge pending stable creatinine and mental status vs continued goals of care discussions   Consultants:  Palliative care medicine Urology  Procedures:  Transthoracic Echocardiogram  Antimicrobials: None    Subjective: Patient without concern today. Family concerned about patient's clinical course, which has not been favorable.  Objective: BP 115/87 (BP Location: Left Arm)   Pulse 75   Temp 98.8 F (37.1 C) (Axillary)   Resp 19   Ht '5\' 9"'$  (1.753 m)   Wt 82.1 kg   SpO2 94%   BMI 26.73 kg/m   Examination:  General exam: Appears calm and comfortable Respiratory system: Clear to auscultation. Respiratory effort normal. Cardiovascular system: S1 & S2 heard, Normal rate with regular rhythm. Gastrointestinal system: Abdomen is nondistended, soft and nontender. No organomegaly or masses felt. Normal bowel sounds heard. Central nervous system: Alert. Answers questions with head nods/shakes Musculoskeletal: No edema. No calf tenderness Skin: No cyanosis. No rashes   Data Reviewed: I have personally reviewed following labs and imaging studies  CBC Lab Results  Component Value Date   WBC 8.8 01/22/2022   RBC 4.68 01/22/2022   HGB 12.3 (L) 01/22/2022   HCT 39.6 01/22/2022   MCV 84.6 01/22/2022   MCH 26.3 01/22/2022   PLT 194 01/22/2022   MCHC 31.1 01/22/2022   RDW 18.7 (H) 01/22/2022   LYMPHSABS 2.2 01/24/2022   MONOABS 0.9 01/22/2022   EOSABS 0.0 01/23/2022   BASOSABS 0.0 70/96/2836     Last metabolic panel Lab Results  Component Value Date   NA 143 01/22/2022   K 4.1 01/22/2022   CL 114 (H) 01/22/2022   CO2 21 (L)  01/22/2022   BUN 40 (H) 01/22/2022   CREATININE 2.14 (H) 01/22/2022   GLUCOSE 89 01/22/2022   GFRNONAA 30 (L) 01/22/2022   GFRAA 53 (L) 05/05/2019   CALCIUM 9.0 01/22/2022   PROT 6.6 01/20/2022   ALBUMIN 3.2 (L) 01/20/2022   BILITOT 1.6 (H) 01/20/2022   ALKPHOS 240 (H) 01/20/2022   AST 80 (H) 01/20/2022   ALT 82 (H) 01/20/2022   ANIONGAP 8 01/22/2022    GFR: Estimated Creatinine Clearance: 26.6 mL/min (A) (by C-G formula based on SCr of 2.14 mg/dL (H)).  Recent Results (from the past 240 hour(s))  Resp Panel by RT-PCR (Flu A&B, Covid) Anterior Nasal Swab     Status: None   Collection Time: 01/17/22  6:12 PM   Specimen: Anterior Nasal Swab  Result Value Ref Range Status   SARS Coronavirus 2 by RT PCR NEGATIVE NEGATIVE Final    Comment: (NOTE) SARS-CoV-2 target nucleic acids are NOT DETECTED.  The SARS-CoV-2 RNA is generally detectable in upper respiratory specimens during the acute phase of infection. The lowest concentration of SARS-CoV-2 viral copies this assay can detect is 138 copies/mL. A negative result does not preclude SARS-Cov-2 infection and should not be used as the sole basis for treatment or other patient management decisions. A negative result may occur with  improper specimen collection/handling, submission of specimen other than nasopharyngeal swab, presence of viral mutation(s) within the areas targeted by this assay, and inadequate number of viral copies(<138 copies/mL). A negative result must be combined with clinical observations, patient history, and epidemiological information. The expected result is Negative.  Fact Sheet for Patients:  EntrepreneurPulse.com.au  Fact Sheet for Healthcare Providers:  IncredibleEmployment.be  This test is no t yet approved or cleared by the [place] and  has been authorized for detection and/or diagnosis of SARS-CoV-2 by FDA under an Emergency Use Authorization (EUA). This  EUA will remain  in effect (meaning this test can be used) for the duration of the COVID-19 declaration under Section 564(b)(1) of the Act, 21 U.S.C.section 360bbb-3(b)(1), unless the authorization is terminated  or revoked sooner.       Influenza A by PCR NEGATIVE NEGATIVE Final   Influenza B by PCR NEGATIVE NEGATIVE Final    Comment: (NOTE) The Xpert Xpress SARS-CoV-2/FLU/RSV plus assay is intended as an aid in the diagnosis of influenza from Nasopharyngeal swab specimens and should not be used as a sole basis for treatment. Nasal washings and aspirates are unacceptable for Xpert Xpress SARS-CoV-2/FLU/RSV testing.  Fact Sheet for Patients: EntrepreneurPulse.com.au  Fact Sheet for Healthcare Providers: IncredibleEmployment.be  This test is not yet approved or cleared by the [place] FDA and has been authorized for detection and/or diagnosis of SARS-CoV-2 by FDA under an Emergency Use Authorization (EUA). This EUA will remain in effect (meaning this test can be used) for the duration of the COVID-19 declaration under Section 564(b)(1) of the Act, 21 U.S.C. section 360bbb-3(b)(1), unless the authorization is terminated or revoked.  Performed at [facility], [place] [address] [place], [place] [address]       Radiology Studies: DG CHEST PORT 1 VIEW  Result Date: 01/21/2022 CLINICAL DATA:  Chest pain. EXAM: PORTABLE CHEST 1 VIEW COMPARISON:  01/05/2022 FINDINGS: Prior median sternotomy and CABG. The heart is normal in size. Stable mediastinal contours, aortic atherosclerosis. Minor bibasilar atelectasis. No acute airspace disease. Normal pulmonary vasculature. No pleural fluid or pneumothorax. No acute osseous findings. IMPRESSION: Minor bibasilar atelectasis. Electronically Signed   By: Keith Rake M.D.   On: 01/21/2022 15:48   CT ABDOMEN PELVIS W CONTRAST  Result Date: 01/20/2022 CLINICAL DATA:  Hematuria.  Increased risk of urinary tract malignancy, knee urologic evaluation for urological malignancy. * Tracking Code: BO *. EXAM: CT ABDOMEN AND PELVIS WITH CONTRAST TECHNIQUE: Multidetector CT imaging of the abdomen and pelvis was performed using the standard protocol following bolus administration of intravenous contrast. RADIATION DOSE REDUCTION: This exam was performed according to the departmental dose-optimization program which includes automated exposure control, adjustment of the mA and/or kV according to patient size and/or use of iterative reconstruction technique. CONTRAST:  48m OMNIPAQUE IOHEXOL 300 MG/ML  SOLN COMPARISON:  Multiple priors including most recent CT January 16, 2022. FINDINGS: Lower chest: Bibasilar atelectasis/scarring. Coronary artery calcifications. Median sternotomy wires. Calcifications aortic valve. Hepatobiliary: Hypoenhancement of the right lobe and medial left lobe of the liver on early postcontrast imaging with equilibration on delayed imaging. No discrete hepatic lesion identified. Gallbladder is unremarkable. No biliary ductal dilation. Pancreas: No pancreatic ductal dilation or evidence of acute inflammation. Spleen: No splenomegaly or focal splenic lesion. Adrenals/Urinary Tract: Bilateral adrenal glands are within normal limits. No hydronephrosis.  Focal left upper pole caliectasis Hypoenhancing ill-defined masslike area in the left upper pole kidney on image 34/2 demonstrating Hounsfield units of 40 pre contrast administration and 70 postcontrast administration. Slight hypoenhancement of the left kidney in comparison to the right. Nodular heterogeneous enhancement in the left renal pelvis measuring proximally 17 mm on image 44/2. Excreted contrast in the right renal pelvis. Urinary bladder is unremarkable for degree of distension. Stomach/Bowel: Stomach is minimally distended limiting evaluation. Focal wall thickening along the second part of the duodenum. No pathologic  dilation of small or large bowel. Colonic diverticulosis without findings of acute diverticulitis.  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 Focal narrowing of the IVC on image 50/6 aortic atherosclerosis. Abdominopelvic collateral vessels are present. Renal veins are not well opacified limiting evaluation. Prominent retroperitoneal lymph nodes for instance a pericaval lymph node measuring 7 mm in short axis on image 26/2 and a portacaval lymph node measuring 5 mm in short axis on image 43/2, stable dating back to July 01, 2020. Reproductive: Prostate is unremarkable. Other: Increased small volume abdominopelvic ascites with mesenteric edema. Nonspecific body wall edema. Musculoskeletal: Multilevel degenerative changes spine. Degenerative change of the bilateral hips. IMPRESSION: 1. Focal narrowing of the mid IVC with fusiform expansion of the intrahepatic IVC with heterogeneous internal enhancement, abdominopelvic collateral vessels are present suggestive of a degree of chronicity, highly suspicious for thrombus either bland or tumoral. Suggest further evaluation with multiphase CTA with and without contrast. 2. Hypoenhancing ill-defined masslike area in the left upper pole kidney is nonspecific and may reflect a focal area of pyelonephritis with pertinent alternate differential consideration of a papillary type renal cell carcinoma. Correlation with laboratory values and attention on follow-up imaging suggested. 3. Heterogeneous enhancement in the left renal pelvis, may reflect excreted contrast mixing although a urothelial carcinoma could appears similar. Attention on follow-up imaging versus ureteroscopy suggested. 4. Hypoenhancement of the left kidney which is nonspecific in this complex case possibly related to the ill-defined area in the renal pelvis described above, avascular pathology or infection  (pyelonephritis. 5. Hypoenhancement of the right lobe and medial left lobe of the liver on early postcontrast imaging with equilibration on delayed imaging, nonspecific but possibly reflecting perfusional changes related to the above IVC pathology. 6. Increased small volume abdominopelvic ascites with mesenteric edema and body wall edema, suggestive of third spacing. 7. Focal wall thickening along the second part of the duodenum, nonspecific but possibly reflecting duodenitis. 8. Colonic diverticulosis without findings of acute diverticulitis. 9. Prominent retroperitoneal lymph nodes, nonspecific but overall stable dating back to July 01, 2020. 10.  Aortic Atherosclerosis (ICD10-I70.0). These results were called by telephone at the time of interpretation on 01/20/2022 at 4:17 pm to provider [facility] , who verbally acknowledged these results. Electronically Signed   By: Dahlia Bailiff M.D.   On: 01/20/2022 16:24      LOS: 5 days    Cordelia Poche, MD [facility] 01/22/2022, 10:27 AM   If 7PM-7AM, please contact night-coverage [email or web address]

## 2022-01-22 NOTE — Progress Notes (Signed)
Daily Progress Note   Patient Name: Albert Short       Date: 01/22/2022 DOB: 1940/09/15  Age: 81 y.o. MRN#: [id number] Attending Physician: Albert Aloe, MD Primary Care Physician: Albert Cha, MD Admit Date: 01/20/2022  Reason for Consultation/Follow-up: Establishing goals of care  Subjective: Patient is resting in bed, is withdrawn, has not had a good oral intake in the past 24 hours, events in the course of the past 24 hours noted, family at bedside at the time of my visit  Length of Stay: 5  Current Medications: Scheduled Meds:   amLODipine  5 mg Oral Daily   docusate sodium  100 mg Oral Daily   feeding supplement  237 mL Oral BID BM   insulin aspart  0-9 Units Subcutaneous TID WC   metoprolol tartrate  25 mg Oral BID   multivitamin with minerals  1 tablet Oral Daily   mouth rinse  15 mL Mouth Rinse 4 times per day   QUEtiapine  12.5 mg Oral QHS   sodium chloride flush  3 mL Intravenous Q12H   Warfarin - Pharmacist Dosing Inpatient   Does not apply q1600    Continuous Infusions:  sodium chloride Stopped (01/22/22 1058)     PRN Meds: acetaminophen **OR** acetaminophen, hydrALAZINE, nitroGLYCERIN, mouth rinse, mouth rinse, polyethylene glycol, traMADol  Physical Exam         Resting in bed Does not appear to be in distress Appears chronically ill Has regular work of breathing  Vital Signs: BP 115/87 (BP Location: Left Arm)   Pulse 75   Temp 98.8 F (37.1 C) (Axillary)   Resp 19   Ht '5\' 9"'$  (1.753 m)   Wt 82.1 kg   SpO2 94%   BMI 26.73 kg/m  SpO2: SpO2: 94 % O2 Device: O2 Device: Room Air O2 Flow Rate:    Intake/output summary:  Intake/Output Summary (Last 24 hours) at 01/22/2022 1138 Last data filed at 01/22/2022 0900 Gross per 24 hour   Intake 180 ml  Output 600 ml  Net -420 ml    LBM: Last BM Date : 01/21/22 Baseline Weight: Weight: 79.8 kg Most recent weight: Weight: 82.1 kg       Palliative Assessment/Data:      Patient Active Problem List   Diagnosis Date Noted   Elevated  troponin 01/22/2022   Bilateral cold feet 01/21/2022   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[place]) 81/82/9937   Acute metabolic acidosis 16/96/7893   Protein-calorie malnutrition, severe 01/18/2022   Likely dementia 81/03/7508   Acute metabolic encephalopathy 25/85/2778   FTT (failure to thrive) in adult 01/10/2022   Supratherapeutic INR 01/31/2022   Essential hypertension 05/21/2019   Hyponatremia 11/23/2014   Acute renal failure superimposed on stage 3b chronic kidney disease ([place]) 11/23/2014   UTI (lower urinary tract infection) 24/23/5361   Chronic systolic CHF (congestive heart failure) ([place]) 11/23/2014   Chest pain 11/22/2014   Paroxysmal atrial fibrillation (HCC)    Old myocardial infarct    Hypercholesterolemia    Obesity    Diverticulosis    GERD (gastroesophageal reflux disease)    CAD (coronary artery disease)    DM type 2 (diabetes mellitus, type 2) ([place])     Palliative Care Assessment & Plan   Patient Profile:    Assessment: 81 year old gentleman with A-fib coronary artery disease heart failure dyslipidemia and diabetes who is admitted to hospital medicine service because of generalized functional decline weight loss memory loss and is admitted with acute kidney injury, underlying [place] stage III chronic kidney disease, failure to thrive, hospital course complicated by delirium and ongoing severe protein calorie malnutrition.  Recommendations/Plan:  CT scan suspicious for left renal cancer renal cell versus urothelial.  Also found to have thrombus in IVC, noted to have a mass upper pole of left kidney and patient was evaluated by urology on 01-20-2022.    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    She continues to follow, will follow-up with family again on 01-23-2022, they has to force him space and time to discuss amongst each other as a family about the new diagnosis of serious illness  of cancer urothelial versus renal cell.      Goals of Care and Additional Recommendations: Limitations on Scope of Treatment: Full Scope Treatment  Code Status:    Code Status Orders  (From admission, onward)           Start     Ordered   01/29/2022 1857  Full code  Continuous        01/15/2022 1859           Code Status History     Date Active Date Inactive Code Status Order ID Comments User Context   11/22/2014 2237 11/24/2014 1546 Full Code [id number]  Albert Hamman, MD ED       Prognosis:  Unable to determine  Discharge Planning: To Be Determined  Care plan was discussed with IDT Thank you for allowing the Palliative Medicine Team to assist in the care of this patient.  low MDM.     Greater than 50%  of this time was spent counseling and coordinating care related to the above assessment and plan.  Albert Chance, MD  Please contact Palliative Medicine Team phone at [phone] for questions and concerns.

## 2022-01-22 NOTE — Progress Notes (Signed)
A consult was placed to the hospital's IV Nurse for new iv access;  difficult to get pt to follow commands and relax his arms to assess; Two other staff members at the bedside; family also in the room;  attempted x 1 in the left ant forearm, with ultrasound, but pt kicked this writer in her arm with his knee.  No further attempts made.

## 2022-01-22 NOTE — Assessment & Plan Note (Signed)
Likely demand ischemia. No new significant EKG findings. Patient with self-limited chest pain symptoms. Per wife, patient has intermittent chest pain requiring nitroglycerin at home. Troponin elevated but flat, also not consistent with ACS.

## 2022-01-22 NOTE — TOC Progression Note (Addendum)
Transition of Care [facility]) - Progression Note    Patient Details  Name: Albert Short MRN: [id number] Date of Birth: 11-29-1940  [facility] CM/SW Contact  Henrietta Dine, RN Phone Number: 01/22/2022, 2:48 PM  Clinical Narrative:    Attempted to contact pt's wife Duane Earnshaw to  provide bed offers; LVM [phone].  -[phone]- contacted pt's wife Izora Gala at [phone]; she was notified of bed offers; Mrs Cardenas would like for the offers to be left in room; sheet with bed offers highlighted in yellow and left on window seat of pt's room; awaiting choice; will need auth.    Expected Discharge Plan: Home/Self Care Barriers to Discharge: Continued Medical Work up  Expected Discharge Plan and Services Expected Discharge Plan: Home/Self Care   Discharge Planning Services: CM Consult   Living arrangements for the past 2 months: Single Family Home                                       Social Determinants of Health (SDOH) Interventions    Readmission Risk Interventions     No data to display

## 2022-01-22 NOTE — Progress Notes (Signed)
   01/22/22 4562  Provider Notification  Provider Name/Title  Cordelia Poche, MD)  Date Provider Notified 01/22/22  Time Provider Notified [phone]  Method of Notification Page  Notification Reason Critical Result  Test performed and critical result INR 4.7  Date Critical Result Received 01/22/22  Time Critical Result Received 0756  Provider response  (MD called)  Date of Provider Response 01/22/22  Time of Provider Response [phone]

## 2022-01-23 ENCOUNTER — Inpatient Hospital Stay (HOSPITAL_COMMUNITY): Payer: Medicare HMO

## 2022-01-23 DIAGNOSIS — R627 Adult failure to thrive: Secondary | ICD-10-CM | POA: Diagnosis not present

## 2022-01-23 DIAGNOSIS — Z515 Encounter for palliative care: Secondary | ICD-10-CM | POA: Diagnosis not present

## 2022-01-23 DIAGNOSIS — G9341 Metabolic encephalopathy: Secondary | ICD-10-CM | POA: Diagnosis not present

## 2022-01-23 DIAGNOSIS — E875 Hyperkalemia: Secondary | ICD-10-CM

## 2022-01-23 DIAGNOSIS — R531 Weakness: Secondary | ICD-10-CM | POA: Diagnosis not present

## 2022-01-23 DIAGNOSIS — Z7189 Other specified counseling: Secondary | ICD-10-CM | POA: Diagnosis not present

## 2022-01-23 DIAGNOSIS — R209 Unspecified disturbances of skin sensation: Secondary | ICD-10-CM

## 2022-01-23 DIAGNOSIS — I10 Essential (primary) hypertension: Secondary | ICD-10-CM | POA: Diagnosis not present

## 2022-01-23 DIAGNOSIS — N179 Acute kidney failure, unspecified: Secondary | ICD-10-CM | POA: Diagnosis not present

## 2022-01-23 LAB — CBC
HCT: 35.2 % — ABNORMAL LOW (ref 39.0–52.0)
Hemoglobin: 11.2 g/dL — ABNORMAL LOW (ref 13.0–17.0)
MCH: 26.9 pg (ref 26.0–34.0)
MCHC: 31.8 g/dL (ref 30.0–36.0)
MCV: 84.6 fL (ref 80.0–100.0)
Platelets: 165 10*3/uL (ref 150–400)
RBC: 4.16 MIL/uL — ABNORMAL LOW (ref 4.22–5.81)
RDW: 18.8 % — ABNORMAL HIGH (ref 11.5–15.5)
WBC: 5.4 10*3/uL (ref 4.0–10.5)
nRBC: 0.4 % — ABNORMAL HIGH (ref 0.0–0.2)

## 2022-01-23 LAB — POTASSIUM: Potassium: 4 mmol/L (ref 3.5–5.1)

## 2022-01-23 LAB — PROTIME-INR
INR: 7.2 (ref 0.8–1.2)
Prothrombin Time: 61.1 seconds — ABNORMAL HIGH (ref 11.4–15.2)

## 2022-01-23 LAB — BASIC METABOLIC PANEL
Anion gap: 12 (ref 5–15)
BUN: 41 mg/dL — ABNORMAL HIGH (ref 8–23)
CO2: 16 mmol/L — ABNORMAL LOW (ref 22–32)
Calcium: 8.9 mg/dL (ref 8.9–10.3)
Chloride: 114 mmol/L — ABNORMAL HIGH (ref 98–111)
Creatinine, Ser: 2.09 mg/dL — ABNORMAL HIGH (ref 0.61–1.24)
GFR, Estimated: 31 mL/min — ABNORMAL LOW (ref >60)
Glucose, Bld: 126 mg/dL — ABNORMAL HIGH (ref 70–99)
Potassium: 5.2 mmol/L — ABNORMAL HIGH (ref 3.5–5.1)
Sodium: 142 mmol/L (ref 135–145)

## 2022-01-23 LAB — GLUCOSE, CAPILLARY
Glucose-Capillary: 165 mg/dL — ABNORMAL HIGH (ref 70–99)
Glucose-Capillary: 143 mg/dL — ABNORMAL HIGH (ref 70–99)
Glucose-Capillary: 114 mg/dL — ABNORMAL HIGH (ref 70–99)
Glucose-Capillary: 97 mg/dL (ref 70–99)

## 2022-01-23 MED ORDER — PHYTONADIONE 5 MG PO TABS
2.5000 mg | ORAL_TABLET | Freq: Once | ORAL | Status: AC
  Administered 2022-01-23: 2.5 mg via ORAL
  Filled 2022-01-23 (×2): qty 1

## 2022-01-23 MED ORDER — QUETIAPINE FUMARATE 25 MG PO TABS
12.5000 mg | ORAL_TABLET | Freq: Two times a day (BID) | ORAL | Status: DC
  Administered 2022-01-23 – 2022-01-24 (×3): 12.5 mg via ORAL
  Filled 2022-01-23 (×3): qty 1

## 2022-01-23 NOTE — TOC Progression Note (Signed)
Transition of Care [facility]) - Progression Note    Patient Details  Name: Albert Short MRN: [id number] Date of Birth: [birth year]  Transition of Care [facility]) CM/SW Contact  Leeroy Cha, RN Phone Number: 01/23/2022, 1:39 PM  Clinical Narrative:    Patient remains confused as to time and place.  Unable to be transfer to snf until mentation clears.   Expected Discharge Plan: Home/Self Care Barriers to Discharge: Continued Medical Work up  Expected Discharge Plan and Services Expected Discharge Plan: Home/Self Care   Discharge Planning Services: CM Consult   Living arrangements for the past 2 months: Single Family Home                                       Social Determinants of Health (SDOH) Interventions    Readmission Risk Interventions   No data to display

## 2022-01-23 NOTE — Progress Notes (Signed)
Daily Progress Note   Patient Name: Albert Short       Date: 01/23/2022 DOB: 12/28/1940  Age: 81 y.o. MRN#: [id number] Attending Physician: Mariel Aloe, MD Primary Care Physician: Leeroy Cha, MD Admit Date: 01/09/2022  Reason for Consultation/Follow-up: Establishing goals of care  Subjective: Patient is resting in bed, restless at times, discussed with wife and daughters    Length of Stay: 6  Current Medications: Scheduled Meds:   amLODipine  5 mg Oral Daily   docusate sodium  100 mg Oral Daily   feeding supplement  237 mL Oral BID BM   insulin aspart  0-9 Units Subcutaneous TID WC   metoprolol tartrate  25 mg Oral BID   multivitamin with minerals  1 tablet Oral Daily   mouth rinse  15 mL Mouth Rinse 4 times per day   phytonadione  2.5 mg Oral Once   QUEtiapine  12.5 mg Oral BID   sodium chloride flush  3 mL Intravenous Q12H   Warfarin - Pharmacist Dosing Inpatient   Does not apply q1600    Continuous Infusions:  sodium chloride Stopped (01/22/22 1058)     PRN Meds: acetaminophen **OR** acetaminophen, hydrALAZINE, nitroGLYCERIN, mouth rinse, mouth rinse, polyethylene glycol, traMADol  Physical Exam         Resting in bed Does not appear to be in distress Appears chronically ill Has regular work of breathing  Vital Signs: BP 113/63 (BP Location: Left Arm)   Pulse (!) 55   Temp 97.6 F (36.4 C) (Oral)   Resp 18   Ht _0  (1.753 m)   Wt 82.1 kg   SpO2 95%   BMI 26.73 kg/m  SpO2: SpO2: 95 % O2 Device: O2 Device: Room Air O2 Flow Rate:    Intake/output summary:  Intake/Output Summary (Last 24 hours) at 01/23/2022 1555 Last data filed at 01/23/2022 0700 Gross per 24 hour  Intake 0 ml  Output 400 ml  Net -400 ml    LBM: Last BM Date :  01/22/22 Baseline Weight: Weight: 79.8 kg Most recent weight: Weight: 82.1 kg       Palliative Assessment/Data:      Patient Active Problem List   Diagnosis Date Noted   Hyperkalemia 01/23/2022   Elevated troponin 01/22/2022   Palliative care by specialist 01/22/2022  Goals of care, counseling/discussion 01/22/2022   General weakness 01/22/2022   Bilateral cold feet 01/21/2022   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[place]) 84/16/6063   Acute metabolic acidosis 01/60/1093   Protein-calorie malnutrition, severe 01/18/2022   Likely dementia 23/55/7322   Acute metabolic encephalopathy 02/54/2706   FTT (failure to thrive) in adult 01/22/2022   Supratherapeutic INR 02/01/2022   Essential hypertension 05/21/2019   Hyponatremia 11/23/2014   Acute renal failure superimposed on stage 3b chronic kidney disease ([place]) 11/23/2014   UTI (lower urinary tract infection) 23/76/2831   Chronic systolic CHF (congestive heart failure) ([place]) 11/23/2014   Chest pain 11/22/2014   Paroxysmal atrial fibrillation (HCC)    Old myocardial infarct    Hypercholesterolemia    Obesity    Diverticulosis    GERD (gastroesophageal reflux disease)    CAD (coronary artery disease)    DM type 2 (diabetes mellitus, type 2) ([place])     Palliative Care Assessment & Plan   Patient Profile:    Assessment: 81 year old gentleman with A-fib coronary artery disease heart failure dyslipidemia and diabetes who is admitted to hospital medicine service because of generalized functional decline weight loss memory loss and is admitted with acute kidney injury, underlying [place] stage III chronic kidney disease, failure to thrive, hospital course complicated by delirium and ongoing severe protein calorie malnutrition.  Recommendations/Plan:  CT scan suspicious for left renal cancer renal cell versus urothelial.  Also found to have thrombus in IVC, noted to have a mass upper pole of left kidney and patient was evaluated by urology on 01-20-2022.     It appears that the patient has either urothelial or renal cell carcinoma at this time and next steps include ureteroscopy with resection/biopsy of the mass as an outpatient, however, patient is noted to be a poor surgical candidate.    Family meeting: Met with the patient's wife daughter and other family members in the room.  We discussed about how the patient's weekend has gone.  I expressed concern that the patient continues to have ongoing decline and decompensation.  CODE STATUS discussions undertaken again.  Broad goals of care again attempted to be defined. Plan: DNR/DNI Explore the option of residential hospice-wife prefers [facility]-TOC consult. Extensive discussions with the patient's family about patient's current illness.  Explained in detail about dying trajectory and also about how this current hospitalization has gone.  Patient's family does not believe that he will benefit from undergoing further work-up.  They would like to see if the patient can be awake alert enough to have some oral intake.       Goals of Care and Additional Recommendations: Limitations on Scope of Treatment: Full Scope Treatment  Code Status:    Code Status Orders  (From admission, onward)           Start     Ordered   01/22/2022 1857  Full code  Continuous        01/14/2022 1859           Code Status History     Date Active Date Inactive Code Status Order ID Comments User Context   11/22/2014 2237 11/24/2014 1546 Full Code [id number]  Lavina Hamman, MD ED       Prognosis:  2-3 weeks  Discharge Planning: Residential hospice is being explored.   Care plan was discussed with wife daughters family.  Thank you for allowing the Palliative Medicine Team to assist in the care of this patient.  High MDM.  Greater than 50%  of this time was spent counseling and coordinating care related to the above assessment and plan.  Loistine Chance, MD  Please contact Palliative Medicine Team  phone at [phone] for questions and concerns.

## 2022-01-23 NOTE — Progress Notes (Signed)
PT Cancellation Note  Patient Details Name: Albert Short MRN: [id number] DOB: 11/28/1940   Cancelled Treatment:    Reason Eval/Treat Not Completed: Other (comment)family meeting with Palliative Medicine.  Will follow for [place], continue PT if indicated.  [place] Office [phone] Weekend [phone]   Claretha Cooper 01/23/2022, 4:31 PM

## 2022-01-23 NOTE — Progress Notes (Signed)
Chaplain engaged in an initial visit with Albert Short, his wife and granddaughter.  [name] was sleeping at time of visit.  Chaplain was able to engage in a brief check in.  They were doing well at this time with no needs.     01/23/22 1500  Clinical Encounter Type  Visited With Patient and family together  Visit Type Initial;Spiritual support  Referral From Palliative care team  Consult/Referral To Chaplain

## 2022-01-23 NOTE — Progress Notes (Signed)
ANTICOAGULATION CONSULT NOTE  Pharmacy Consult for warfarin Indication: atrial fibrillation  No Known Allergies  Patient Measurements: Height: '5\' 9"'$  (175.3 cm) Weight: 82.1 kg (181 lb) IBW/kg (Calculated) : 70.7  Vital Signs: Temp: 97.5 F (36.4 C) (11/20 0306) Temp Source: Oral (11/20 0306) BP: 146/70 (11/20 0306) Pulse Rate: 64 (11/20 0306)  Labs: Recent Labs    01/21/22 0637 01/21/22 1225 01/21/22 1424 01/22/22 0624 01/23/22 0414  HGB 12.6*  --   --  12.3*  --   HCT 39.6  --   --  39.6  --   PLT 226  --   --  194  --   LABPROT 40.4*  --   --  44.2* 61.1*  INR 4.2*  --   --  4.7* 7.2*  CREATININE 1.93*  --   --  2.14* 2.09*  TROPONINIHS  --  181* 179*  --   --      Estimated Creatinine Clearance: 27.3 mL/min (A) (by C-G formula based on SCr of 2.09 mg/dL (H)).   Assessment: 36 yoM with PMH Afib on warfarin PTA, CHF, CAD, DM2, HTN, admitted 11/13 for weakness, weight loss, and general decline. Noted to have elevated INR and SCr on admission. Given Vitamin K 10 mg IV x 1, and Pharmacy consulted to dose warfarin once appropriate.  Baseline INR markedly elevated, likely d/t recent poor PO intake/weight loss (down ~16 kg since this time last year) Prior anticoagulation: Med history incomplete; fill history suggests 5 mg daily (5 mg x 90 tabs for 90 day supply, filled 11/1)  Significant events: - 11/13: Vit K '10mg'$  IV x1 - 11/20: Vit K 2.5 mg PO x1  Today, 01/23/2022: INR supratherapeutic and increased to 7.2, despite holding warfarin CBC last on 11/19: Hgb 12.3 low/ stable; Plt WNL Major drug interactions: none No bleeding issues documented  Goal of Therapy: INR 2-3  Plan: No warfarin today  Vitamin K 2.5 mg PO x1 dose per MD.  Daily INR Monitor for signs of bleeding or thrombosis  Gretta Arab PharmD, BCPS [facility] main pharmacy [phone] 01/23/2022 7:36 AM

## 2022-01-23 NOTE — Assessment & Plan Note (Addendum)
Mild. Resolved spontaneously.

## 2022-01-23 NOTE — Progress Notes (Signed)
Pt is very restless in bed, confused and uncooperative, still no IV access. Pt very high risk of pulling lines. Will cont to monitor.

## 2022-01-23 NOTE — Progress Notes (Signed)
ABI's have been completed. Preliminary results can be found in CV Proc through chart review.   01/23/22 8:45 AM Albert Short RVT

## 2022-01-23 NOTE — Progress Notes (Signed)
PROGRESS NOTE    Albert Short  STM:[id number] DOB: 06-27-1940 DOA: 01/11/2022 PCP: Leeroy Cha, MD   Brief Narrative: Albert Short is a 81 y.o. male with a history of atrial fibrillation on Coumadin, CAD, diastolic heart failure, hyperlipidemia and diabetes mellitus. He presented secondary to progressive generalized decline and weight loss of several months and admitted for AKI. 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 Urology and palliative care consulted. Admission complicated by delirium.   Assessment and Plan: * Acute renal failure superimposed on stage 3b chronic kidney disease (HCC) Baseline creatinine is about 1.4-1.9 from most recent labs. Creatinine of 3.00 on admission and had improved with IV fluids down to 1.82. Creatinine worsened again once patient taken off IV fluids. IV fluids restarted. -Continue IV fluids  Acute metabolic encephalopathy Unclear etiology. No formal diagnosis of dementia, but patient has a history of progressive memory loss. Patient started on Seroquel while inpatient. -Continue Seroquel for now; decrease to Seroquel 12.5 mg but increase frequency to BID -Delirium precautions -Address pain, toilet needs, family at bedside when able, etc.  FTT (failure to thrive) in adult Progressive. Possibly related to new concern for renal cell cancer vs demential. Palliative care consulted. On-going goals of care discussions. Patient appears to nearing possible terminal state as he is not abel to adequately hydrate and nourish himself. Family agrees that feeding tube would not be an option for Mr. Gibeau. -Continued goals of care  Hyperkalemia Mild. -Repeat potassium  Elevated troponin Likely demand ischemia. No new significant EKG findings. Patient with self-limited chest pain symptoms. Per wife, patient has intermittent chest pain  requiring nitroglycerin at home. Troponin elevated but flat, also not consistent with ACS.  Bilateral cold feet Patient would not let me examine pulses this morning. ABIs with noncompressible vessels; unable to obtain TBIs secondary to patient motion. No evidence of critical limb ischemia at this time. Recommend outpatient follow-up.  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 Reimaging complicated by underlying AKI on CKD. Will discontinue repeat CT scan at this time and consider alternatives pending continued goals of care discussions. Patient is on Coumadin chronically for atrial fibrillation.  Acute metabolic acidosis Secondary to AKI with increasing anion gap. -Continue IV fluids -If worsening, may switch to sodium bicarbonate IV fluids  Likely dementia Noted. Depending on goals of care, patient would benefit from Neurology referral.  Protein-calorie malnutrition, severe Dietitian recommendations (11/15): Continue regular diet as tolerated Provide daily MVI for taste changes and poor appetite Provide Ensure Enlive or Ensure Plus HP BID (350kcal, 20g protein/bottle) Provide meal preferences and assist with meal setup to optimize po intake  Supratherapeutic INR Patient treated with vitamin K on initial presentation secondary to associated hematuria. INR improved but has now trended back up. No evidence of concurrent hemorrhage at this time. Hemoglobin stable. -Continue Coumadin per pharmacy -Vitamin K 2.5 mg once secondary to lack of IV access at this time  Essential hypertension Some uncontrolled hypertension. Patient is on metoprolol, amlodipine, clonidine, hydrochlorothiazide  and losartan as an outpatient. Losartan, hydrochlorothiazide, amlodipine and clonidine held. -Continue metoprolol -Continue amlodipine 5 mg daily  Chronic systolic CHF (congestive heart failure) (HCC) Stable. Losartan held secondary to AKI. -Continue metoprolol  DM type 2 (diabetes mellitus, type 2)  (HCC) Hemoglobin A1C of 6.5%. Patient is managed on metformin as an outpatient. -Continue SSI  CAD (coronary artery disease) History of CABG. Patient  now with chest pain without dyspnea. Troponin up to 181 today. It is possible this could be related to heart failure after diuresis. Recent Transthoracic Echocardiogram was of poor quality secondary to uncooperative patient and respiratory motion. Episode of chest pain not consistent with ACS. Possible angina as patient has history of nitroglycerin use at home.  Hypercholesterolemia Patient is prescribed simvastatin but noted to be discontinued.  Paroxysmal atrial fibrillation (HCC) Currently in sinus rhythm. Patient is managed on metoprolol and Coumadin. -Continue metoprolol -Continue Coumadin  Hypokalemia-resolved as of 01/21/2022 Resolved with supplementation    DVT prophylaxis: Coumadin Code Status:   Code Status: Full Code Family Communication: Granddaughter at bedside. Many family members in the waiting room Disposition Plan: Discharge pending stable creatinine and mental status vs continued goals of care discussions   Consultants:  Palliative care medicine Urology  Procedures:  Transthoracic Echocardiogram  Antimicrobials: None    Subjective: Agitation overnight. No issues this morning.  Objective: BP 113/63 (BP Location: Left Arm)   Pulse (!) 55   Temp 97.6 F (36.4 C) (Oral)   Resp 18   Ht '5\' 9"'$  (1.753 m)   Wt 82.1 kg   SpO2 95%   BMI 26.73 kg/m   Examination:  General exam: Appears calm and comfortable Respiratory system: Clear to auscultation. Respiratory effort normal. Cardiovascular system: S1 & S2 heard. Gastrointestinal system: Abdomen is nondistended, soft and nontender. Normal bowel sounds heard. Central nervous system: Alert. Musculoskeletal: No edema. No calf tenderness   Data Reviewed: I have personally reviewed following labs and imaging studies  CBC Lab Results  Component Value Date    WBC 5.4 01/23/2022   RBC 4.16 (L) 01/23/2022   HGB 11.2 (L) 01/23/2022   HCT 35.2 (L) 01/23/2022   MCV 84.6 01/23/2022   MCH 26.9 01/23/2022   PLT 165 01/23/2022   MCHC 31.8 01/23/2022   RDW 18.8 (H) 01/23/2022   LYMPHSABS 2.2 01/21/2022   MONOABS 0.9 01/31/2022   EOSABS 0.0 01/19/2022   BASOSABS 0.0 09/47/0962     Last metabolic panel Lab Results  Component Value Date   NA 142 01/23/2022   K 5.2 (H) 01/23/2022   CL 114 (H) 01/23/2022   CO2 16 (L) 01/23/2022   BUN 41 (H) 01/23/2022   CREATININE 2.09 (H) 01/23/2022   GLUCOSE 126 (H) 01/23/2022   GFRNONAA 31 (L) 01/23/2022   GFRAA 53 (L) 05/05/2019   CALCIUM 8.9 01/23/2022   PROT 6.6 01/20/2022   ALBUMIN 3.2 (L) 01/20/2022   BILITOT 1.6 (H) 01/20/2022   ALKPHOS 240 (H) 01/20/2022   AST 80 (H) 01/20/2022   ALT 82 (H) 01/20/2022   ANIONGAP 12 01/23/2022    GFR: Estimated Creatinine Clearance: 27.3 mL/min (A) (by C-G formula based on SCr of 2.09 mg/dL (H)).  Recent Results (from the past 240 hour(s))  Resp Panel by RT-PCR (Flu A&B, Covid) Anterior Nasal Swab     Status: None   Collection Time: 01/17/22  6:12 PM   Specimen: Anterior Nasal Swab  Result Value Ref Range Status   SARS Coronavirus 2 by RT PCR NEGATIVE NEGATIVE Final    Comment: (NOTE) SARS-CoV-2 target nucleic acids are NOT DETECTED.  The SARS-CoV-2 RNA is generally detectable in upper respiratory specimens during the acute phase of infection. The lowest concentration of SARS-CoV-2 viral copies this assay can detect is 138 copies/mL. A negative result does not preclude SARS-Cov-2 infection and should not be used as the sole basis for treatment or other patient management decisions. A  negative result may occur with  improper specimen collection/handling, submission of specimen other than nasopharyngeal swab, presence of viral mutation(s) within the areas targeted by this assay, and inadequate number of viral copies(<138 copies/mL). A negative result  must be combined with clinical observations, patient history, and epidemiological information. The expected result is Negative.  Fact Sheet for Patients:  EntrepreneurPulse.com.au  Fact Sheet for Healthcare Providers:  IncredibleEmployment.be  This test is no t yet approved or cleared by the [place] FDA and  has been authorized for detection and/or diagnosis of SARS-CoV-2 by FDA under an Emergency Use Authorization (EUA). This EUA will remain  in effect (meaning this test can be used) for the duration of the COVID-19 declaration under Section 564(b)(1) of the Act, 21 U.S.C.section 360bbb-3(b)(1), unless the authorization is terminated  or revoked sooner.       Influenza A by PCR NEGATIVE NEGATIVE Final   Influenza B by PCR NEGATIVE NEGATIVE Final    Comment: (NOTE) The Xpert Xpress SARS-CoV-2/FLU/RSV plus assay is intended as an aid in the diagnosis of influenza from Nasopharyngeal swab specimens and should not be used as a sole basis for treatment. Nasal washings and aspirates are unacceptable for Xpert Xpress SARS-CoV-2/FLU/RSV testing.  Fact Sheet for Patients: EntrepreneurPulse.com.au  Fact Sheet for Healthcare Providers: IncredibleEmployment.be  This test is not yet approved or cleared by the [place] FDA and has been authorized for detection and/or diagnosis of SARS-CoV-2 by FDA under an Emergency Use Authorization (EUA). This EUA will remain in effect (meaning this test can be used) for the duration of the COVID-19 declaration under Section 564(b)(1) of the Act, 21 U.S.C. section 360bbb-3(b)(1), unless the authorization is terminated or revoked.  Performed at [facility], [place] [address], [place], [place] [address]       Radiology Studies: VAS [place] ABI WITH/WO TBI  Result Date: 01/23/2022  LOWER EXTREMITY DOPPLER STUDY Patient Name:  NAPOLEON MONACELLI  Date of  Exam:   01/23/2022 Medical Rec #: [id number]         Accession #:    [id number] Date of Birth: April 17, 1940         Patient Gender: M Patient Age:   75 years Exam Location:  [facility] Procedure:      VAS [place] ABI WITH/WO TBI Referring Phys: Braylin Xu --------------------------------------------------------------------------------  Indications: Cold feet High Risk Factors: Hypertension, Diabetes.  Limitations: Today's exam was limited due to an open wound, bandages and patient              constant movement. Comparison Study: No prior studies. Performing Technologist: Carlos Levering RVT  Examination Guidelines: A complete evaluation includes at minimum, Doppler waveform signals and systolic blood pressure reading at the level of bilateral brachial, anterior tibial, and posterior tibial arteries, when vessel segments are accessible. Bilateral testing is considered an integral part of a complete examination. Photoelectric Plethysmograph (PPG) waveforms and toe systolic pressure readings are included as required and additional duplex testing as needed. Limited examinations for reoccurring indications may be performed as noted.  ABI Findings: +--------+------------------+-----+-----------+--------+ Right   Rt Pressure (mmHg)IndexWaveform   Comment  +--------+------------------+-----+-----------+--------+ [id number]                    triphasic           +--------+------------------+-----+-----------+--------+ PTA     254               1.64 multiphasic         +--------+------------------+-----+-----------+--------+  DP      248               1.60 monophasic          +--------+------------------+-----+-----------+--------+ +--------+------------------+-----+----------+--------+ Left    Lt Pressure (mmHg)IndexWaveform  Comment  +--------+------------------+-----+----------+--------+ Brachial                                 Movement  +--------+------------------+-----+----------+--------+ PTA     254               1.64 monophasic         +--------+------------------+-----+----------+--------+ DP      254               1.64 monophasic         +--------+------------------+-----+----------+--------+ +-------+-----------+-----------+------------+------------+ ABI/TBIToday's ABIToday's TBIPrevious ABIPrevious TBI +-------+-----------+-----------+------------+------------+ Right  [place]                                             +-------+-----------+-----------+------------+------------+ Left   [place]                                             +-------+-----------+-----------+------------+------------+  Summary: Right: Resting right ankle-brachial index indicates noncompressible right lower extremity arteries. Unable to obtain TBI due to patient constant movement. Left: Resting left ankle-brachial index indicates noncompressible left lower extremity arteries. Unable to obtain TBI due to patient constant movement. *See table(s) above for measurements and observations.     Preliminary    DG CHEST PORT 1 VIEW  Result Date: 01/21/2022 CLINICAL DATA:  Chest pain. EXAM: PORTABLE CHEST 1 VIEW COMPARISON:  01/13/2022 FINDINGS: Prior median sternotomy and CABG. The heart is normal in size. Stable mediastinal contours, aortic atherosclerosis. Minor bibasilar atelectasis. No acute airspace disease. Normal pulmonary vasculature. No pleural fluid or pneumothorax. No acute osseous findings. IMPRESSION: Minor bibasilar atelectasis. Electronically Signed   By: Keith Rake M.D.   On: 01/21/2022 15:48      LOS: 6 days    Cordelia Poche, MD [facility] 01/23/2022, 1:44 PM   If 7PM-7AM, please contact night-coverage [email or web address]

## 2022-01-24 DIAGNOSIS — G9341 Metabolic encephalopathy: Secondary | ICD-10-CM | POA: Diagnosis not present

## 2022-01-24 DIAGNOSIS — R338 Other retention of urine: Secondary | ICD-10-CM | POA: Insufficient documentation

## 2022-01-24 DIAGNOSIS — I10 Essential (primary) hypertension: Secondary | ICD-10-CM | POA: Diagnosis not present

## 2022-01-24 DIAGNOSIS — R627 Adult failure to thrive: Secondary | ICD-10-CM | POA: Diagnosis not present

## 2022-01-24 DIAGNOSIS — N179 Acute kidney failure, unspecified: Secondary | ICD-10-CM | POA: Diagnosis not present

## 2022-01-24 LAB — GLUCOSE, CAPILLARY
Glucose-Capillary: 86 mg/dL (ref 70–99)
Glucose-Capillary: 188 mg/dL — ABNORMAL HIGH (ref 70–99)
Glucose-Capillary: 121 mg/dL — ABNORMAL HIGH (ref 70–99)
Glucose-Capillary: 112 mg/dL — ABNORMAL HIGH (ref 70–99)

## 2022-01-24 LAB — CBC
HCT: 43.6 % (ref 39.0–52.0)
Hemoglobin: 13.5 g/dL (ref 13.0–17.0)
MCH: 26.7 pg (ref 26.0–34.0)
MCHC: 31 g/dL (ref 30.0–36.0)
MCV: 86.3 fL (ref 80.0–100.0)
Platelets: 209 10*3/uL (ref 150–400)
RBC: 5.05 MIL/uL (ref 4.22–5.81)
RDW: 19.9 % — ABNORMAL HIGH (ref 11.5–15.5)
WBC: 5 10*3/uL (ref 4.0–10.5)
nRBC: 0.4 % — ABNORMAL HIGH (ref 0.0–0.2)

## 2022-01-24 LAB — BASIC METABOLIC PANEL
Anion gap: 11 (ref 5–15)
BUN: 44 mg/dL — ABNORMAL HIGH (ref 8–23)
CO2: 19 mmol/L — ABNORMAL LOW (ref 22–32)
Calcium: 8.5 mg/dL — ABNORMAL LOW (ref 8.9–10.3)
Chloride: 113 mmol/L — ABNORMAL HIGH (ref 98–111)
Creatinine, Ser: 2.24 mg/dL — ABNORMAL HIGH (ref 0.61–1.24)
GFR, Estimated: 29 mL/min — ABNORMAL LOW (ref >60)
Glucose, Bld: 101 mg/dL — ABNORMAL HIGH (ref 70–99)
Potassium: 4.1 mmol/L (ref 3.5–5.1)
Sodium: 143 mmol/L (ref 135–145)

## 2022-01-24 LAB — PROTIME-INR
INR: 5.2 (ref 0.8–1.2)
Prothrombin Time: 47.8 seconds — ABNORMAL HIGH (ref 11.4–15.2)

## 2022-01-24 MED ORDER — QUETIAPINE FUMARATE 25 MG PO TABS
12.5000 mg | ORAL_TABLET | Freq: Every day | ORAL | Status: DC
  Administered 2022-01-25: 12.5 mg via ORAL
  Filled 2022-01-24: qty 1

## 2022-01-24 NOTE — Progress Notes (Signed)
[facility] ([facility]) Hospital Liaison Note  Referral received for patient/family interest in [facility]. Chart under review by [facility] physician.   Hospice eligibility pending.   Please call with any questions or concerns. Thank you   Shanita Wicker, LCSW [facility] Hospital Liaison  [phone] 

## 2022-01-24 NOTE — Assessment & Plan Note (Signed)
-  Insert foley catheter

## 2022-01-24 NOTE — Care Management Important Message (Signed)
Important Message  Patient Details IM Letter placed in Patient's room. Name: Albert Short MRN: [id number] Date of Birth: 1940-06-17   Medicare Important Message Given:  Yes     Kerin Salen 01/24/2022, 10:21 AM

## 2022-01-24 NOTE — Progress Notes (Incomplete)
Nutrition Follow-up  DOCUMENTATION CODES:   Severe malnutrition in context of chronic illness  INTERVENTION:  ***   NUTRITION DIAGNOSIS:   Severe Malnutrition related to chronic illness (ongoing confusion and lethargy) as evidenced by percent weight loss, severe fat depletion, severe muscle depletion.  ***  GOAL:   Patient will meet greater than or equal to 90% of their needs  ***  MONITOR:   PO intake, Supplement acceptance  REASON FOR ASSESSMENT:   Consult Assessment of nutrition requirement/status  ASSESSMENT:   Pt is an 81yo M with PMH of CHF, a-fib, CAD, HLD, obesity, diverticulosis, DM, GERD, and HTN who presents with generalized decline.  ***    Medications reviewed and include: Colace, MVI with minerals, Warfarin  Labs reviewed:  Creatinine 2.24 HA1C 6.5 Blood Glucose 86-165 x24 hours   Diet Order:   Diet Order             DIET DYS 3 Room service appropriate? No; Fluid consistency: Thin  Diet effective now                   EDUCATION NEEDS:   Education needs have been addressed  Skin:  Skin Assessment: Reviewed RN Assessment ***  Last BM:  11/13 ***  Height:   Ht Readings from Last 1 Encounters:  01/19/22 5\' 9"  (1.753 m)    Weight:   Wt Readings from Last 1 Encounters:  01/24/22 84.3 kg    Ideal Body Weight:     BMI:  Body mass index is 27.44 kg/m.  Estimated Nutritional Needs:   Kcal:  1995-2395kcal  Protein:  80-95g protein  Fluid:  1995-2347mL    Shelle Iron RD, LDN For contact information, refer to [facility].

## 2022-01-24 NOTE — Progress Notes (Signed)
ANTICOAGULATION CONSULT NOTE  Pharmacy Consult for warfarin Indication: atrial fibrillation  No Known Allergies  Patient Measurements: Height: '5\' 9"'$  (175.3 cm) Weight: 84.3 kg (185 lb 13.6 oz) IBW/kg (Calculated) : 70.7  Vital Signs: Temp: 97.8 F (36.6 C) (11/21 0441) Temp Source: Oral (11/21 0441) BP: 155/80 (11/21 0441) Pulse Rate: 53 (11/21 0441)  Labs: Recent Labs    01/21/22 1225 01/21/22 1424 01/22/22 0624 01/22/22 0624 01/23/22 0414 01/23/22 1208 01/24/22 0405  HGB  --   --  12.3*   < >  --  11.2* 13.5  HCT  --   --  39.6  --   --  35.2* 43.6  PLT  --   --  194  --   --  165 209  LABPROT  --   --  44.2*  --  61.1*  --  47.8*  INR  --   --  4.7*  --  7.2*  --  5.2*  CREATININE  --   --  2.14*  --  2.09*  --  2.24*  TROPONINIHS 181* 179*  --   --   --   --   --    < > = values in this interval not displayed.     Estimated Creatinine Clearance: 25.4 mL/min (A) (by C-G formula based on SCr of 2.24 mg/dL (H)).   Assessment: 40 yoM with PMH Afib on warfarin PTA, CHF, CAD, DM2, HTN, admitted 11/13 for weakness, weight loss, and general decline. Noted to have elevated INR and SCr on admission. Given Vitamin K 10 mg IV x 1, and Pharmacy consulted to dose warfarin once appropriate.  Baseline INR markedly elevated, likely d/t recent poor PO intake/weight loss (down ~16 kg since this time last year) Prior anticoagulation: Med history incomplete; fill history suggests 5 mg daily (5 mg x 90 tabs for 90 day supply, filled 11/1)  Significant events: - 11/13: Vit K '10mg'$  IV x1 - 11/20: Vit K 2.5 mg PO x1  Today, 01/24/2022: INR remains supratherapeutic but decreased to 5.2 after vitamin K yesterday CBC shows increased Hgb 13.5, Plt WNL Major drug interactions: none No bleeding issues documented  Goal of Therapy: INR 2-3  Plan: No warfarin today  Daily INR Monitor for signs of bleeding or thrombosis Follow up long-term anticoagulation plans with discharge  planning that may include residential hospice.  Gretta Arab PharmD, BCPS [facility] main pharmacy [phone] 01/24/2022 7:19 AM

## 2022-01-24 NOTE — Progress Notes (Signed)
PROGRESS NOTE    Albert Short  DGU:[id number] DOB: [birth year] DOA: 01/31/2022 PCP: Leeroy Cha, MD   Brief Narrative: Albert Short is a 81 y.o. male with a history of atrial fibrillation on Coumadin, CAD, diastolic heart failure, hyperlipidemia and diabetes mellitus. He presented secondary to progressive generalized decline and weight loss of several months and admitted for AKI. 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 Urology and palliative care consulted. Admission complicated by delirium. After continued goals of care discussions, patient transitioned to DNR/DNI with plan to transfer to hospice facility.   Assessment and Plan: * Acute renal failure superimposed on stage 3b chronic kidney disease (HCC) Baseline creatinine is about 1.4-1.9 from most recent labs. Creatinine of 3.00 on admission and had improved with IV fluids down to 1.82. Creatinine worsened again once patient taken off IV fluids. IV fluids restarted. -Continue IV fluids  Acute metabolic encephalopathy Unclear etiology. No formal diagnosis of dementia, but patient has a history of progressive memory loss. Patient started on Seroquel while inpatient. Change in Seroquel dosing appeared to improve behavior, however family states he is still a bit too sleepy during the day. -Discontinue Seroquel BID and continue qHS dosing only -Delirium precautions -Address pain, toilet needs, family at bedside when able, etc.  FTT (failure to thrive) in adult Progressive. Possibly related to new concern for renal cell cancer vs demential. Palliative care consulted. On-going goals of care discussions. Patient appears to nearing possible terminal state as he is not abel to adequately hydrate and nourish himself. Family agrees that feeding tube would not be an option for Albert Short. Decision made to transfer to hospice  facility.  Acute urinary retention -Insert foley catheter  Elevated troponin Likely demand ischemia. No new significant EKG findings. Patient with self-limited chest pain symptoms. Per wife, patient has intermittent chest pain requiring nitroglycerin at home. Troponin elevated but flat, also not consistent with ACS.  Bilateral cold feet Decreased pulses. ABIs with noncompressible vessels; unable to obtain TBIs secondary to patient motion. No evidence of critical limb ischemia at this time. Recommendation outpatient follow-up however patient is now transitioning to hospice facility.  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 Reimaging complicated by underlying AKI on CKD. Will discontinue repeat CT scan at this time and consider alternatives pending continued goals of care discussions. Patient is on Coumadin chronically for atrial fibrillation.  Acute metabolic acidosis Secondary to AKI with increasing anion gap. Improving with resumption of IV fluids. -Continue IV fluids  Likely dementia Noted. Depending on goals of care, patient would benefit from Neurology referral.  Protein-calorie malnutrition, severe Dietitian recommendations (11/15): Continue regular diet as tolerated Provide daily MVI for taste changes and poor appetite Provide Ensure Enlive or Ensure Plus HP BID (350kcal, 20g protein/bottle) Provide meal preferences and assist with meal setup to optimize po intake  Supratherapeutic INR Patient treated with vitamin K on initial presentation secondary to associated hematuria. INR improved but has now trended back up. No evidence of concurrent hemorrhage at this time. Hemoglobin stable. Vitamin K 2.5 mg given on 11/20.  -Discontinue Coumadin now that plan is for transfer to hospice facility  Essential hypertension Some uncontrolled hypertension. Patient is on metoprolol, amlodipine, clonidine, hydrochlorothiazide  and losartan as an outpatient. Losartan, hydrochlorothiazide,  amlodipine and clonidine held. -Continue metoprolol -Continue amlodipine 5 mg daily  Chronic systolic CHF (congestive heart failure) (HCC) Stable. Losartan held secondary  to AKI. -Continue metoprolol  DM type 2 (diabetes mellitus, type 2) (HCC) Hemoglobin A1C of 6.5%. Patient is managed on metformin as an outpatient. -Continue SSI  CAD (coronary artery disease) History of CABG. Patient now with chest pain without dyspnea. Troponin up to 181 today. It is possible this could be related to heart failure after diuresis. Recent Transthoracic Echocardiogram was of poor quality secondary to uncooperative patient and respiratory motion. Episode of chest pain not consistent with ACS. Possible angina as patient has history of nitroglycerin use at home.  Hypercholesterolemia Patient is prescribed simvastatin but noted to be discontinued.  Paroxysmal atrial fibrillation (HCC) Currently in sinus rhythm. Patient is managed on metoprolol and Coumadin. INR has been supratherapeutic so coumadin is held. -Continue metoprolol  Hyperkalemia-resolved as of 01/24/2022 Mild. Resolved spontaneously.  Hypokalemia-resolved as of 01/21/2022 Resolved with supplementation    DVT prophylaxis: Coumadin Code Status:   Code Status: DNR Family Communication: Wife, daughters and granddaughter at bedside Disposition Plan: Discharge to hospice facility once bed is available   Consultants:  Palliative care medicine Urology  Procedures:  Transthoracic Echocardiogram ABIs  Antimicrobials: None    Subjective: No issues noted overnight. Did not eat much this morning even when fed.  Objective: BP (!) 125/97 (BP Location: Left Arm)   Pulse (!) 44   Temp (!) 97.5 F (36.4 C) (Axillary)   Resp 20   Ht '5\' 9"'$  (1.753 m)   Wt 84.3 kg   SpO2 95%   BMI 27.44 kg/m   Examination:  General exam: Appears calm and comfortable Respiratory system: Clear to auscultation. Respiratory effort  normal. Cardiovascular system: S1 & S2 heard. Gastrointestinal system: Abdomen is nondistended, soft and nontender. Central nervous system: Alert and oriented. No focal neurological deficits. Psychiatry: Hallucinations   Data Reviewed: I have personally reviewed following labs and imaging studies  CBC Lab Results  Component Value Date   WBC 5.0 01/24/2022   RBC 5.05 01/24/2022   HGB 13.5 01/24/2022   HCT 43.6 01/24/2022   MCV 86.3 01/24/2022   MCH 26.7 01/24/2022   PLT 209 01/24/2022   MCHC 31.0 01/24/2022   RDW 19.9 (H) 01/24/2022   LYMPHSABS 2.2 02/02/2022   MONOABS 0.9 01/29/2022   EOSABS 0.0 01/30/2022   BASOSABS 0.0 27/25/3664     Last metabolic panel Lab Results  Component Value Date   NA 143 01/24/2022   K 4.1 01/24/2022   CL 113 (H) 01/24/2022   CO2 19 (L) 01/24/2022   BUN 44 (H) 01/24/2022   CREATININE 2.24 (H) 01/24/2022   GLUCOSE 101 (H) 01/24/2022   GFRNONAA 29 (L) 01/24/2022   GFRAA 53 (L) 05/05/2019   CALCIUM 8.5 (L) 01/24/2022   PROT 6.6 01/20/2022   ALBUMIN 3.2 (L) 01/20/2022   BILITOT 1.6 (H) 01/20/2022   ALKPHOS 240 (H) 01/20/2022   AST 80 (H) 01/20/2022   ALT 82 (H) 01/20/2022   ANIONGAP 11 01/24/2022    GFR: Estimated Creatinine Clearance: 25.4 mL/min (A) (by C-G formula based on SCr of 2.24 mg/dL (H)).  Recent Results (from the past 240 hour(s))  Resp Panel by RT-PCR (Flu A&B, Covid) Anterior Nasal Swab     Status: None   Collection Time: 01/17/22  6:12 PM   Specimen: Anterior Nasal Swab  Result Value Ref Range Status   SARS Coronavirus 2 by RT PCR NEGATIVE NEGATIVE Final    Comment: (NOTE) SARS-CoV-2 target nucleic acids are NOT DETECTED.  The SARS-CoV-2 RNA is generally detectable in upper respiratory specimens  during the acute phase of infection. The lowest concentration of SARS-CoV-2 viral copies this assay can detect is 138 copies/mL. A negative result does not preclude SARS-Cov-2 infection and should not be used as the sole  basis for treatment or other patient management decisions. A negative result may occur with  improper specimen collection/handling, submission of specimen other than nasopharyngeal swab, presence of viral mutation(s) within the areas targeted by this assay, and inadequate number of viral copies(<138 copies/mL). A negative result must be combined with clinical observations, patient history, and epidemiological information. The expected result is Negative.  Fact Sheet for Patients:  EntrepreneurPulse.com.au  Fact Sheet for Healthcare Providers:  IncredibleEmployment.be  This test is no t yet approved or cleared by the [place] FDA and  has been authorized for detection and/or diagnosis of SARS-CoV-2 by FDA under an Emergency Use Authorization (EUA). This EUA will remain  in effect (meaning this test can be used) for the duration of the COVID-19 declaration under Section 564(b)(1) of the Act, 21 U.S.C.section 360bbb-3(b)(1), unless the authorization is terminated  or revoked sooner.       Influenza A by PCR NEGATIVE NEGATIVE Final   Influenza B by PCR NEGATIVE NEGATIVE Final    Comment: (NOTE) The Xpert Xpress SARS-CoV-2/FLU/RSV plus assay is intended as an aid in the diagnosis of influenza from Nasopharyngeal swab specimens and should not be used as a sole basis for treatment. Nasal washings and aspirates are unacceptable for Xpert Xpress SARS-CoV-2/FLU/RSV testing.  Fact Sheet for Patients: EntrepreneurPulse.com.au  Fact Sheet for Healthcare Providers: IncredibleEmployment.be  This test is not yet approved or cleared by the [place] FDA and has been authorized for detection and/or diagnosis of SARS-CoV-2 by FDA under an Emergency Use Authorization (EUA). This EUA will remain in effect (meaning this test can be used) for the duration of the COVID-19 declaration under Section 564(b)(1) of the Act,  21 U.S.C. section 360bbb-3(b)(1), unless the authorization is terminated or revoked.  Performed at [facility], [place] [address] [place], [place] [address]       Radiology Studies: VAS [place] ABI WITH/WO TBI  Result Date: 01/23/2022  LOWER EXTREMITY DOPPLER STUDY Patient Name:  Albert Short  Date of Exam:   01/23/2022 Medical Rec #: [id number]         Accession #:    [id number] Date of Birth: [birth year]         Patient Gender: M Patient Age:   23 years Exam Location:  [facility] Procedure:      VAS [place] ABI WITH/WO TBI Referring Phys: Demoni Parmar --------------------------------------------------------------------------------  Indications: Cold feet High Risk Factors: Hypertension, Diabetes.  Limitations: Today's exam was limited due to an open wound, bandages and patient              constant movement. Comparison Study: No prior studies. Performing Technologist: Carlos Levering RVT  Examination Guidelines: A complete evaluation includes at minimum, Doppler waveform signals and systolic blood pressure reading at the level of bilateral brachial, anterior tibial, and posterior tibial arteries, when vessel segments are accessible. Bilateral testing is considered an integral part of a complete examination. Photoelectric Plethysmograph (PPG) waveforms and toe systolic pressure readings are included as required and additional duplex testing as needed. Limited examinations for reoccurring indications may be performed as noted.  ABI Findings: +--------+------------------+-----+-----------+--------+ Right   Rt Pressure (mmHg)IndexWaveform   Comment  +--------+------------------+-----+-----------+--------+ [id number]  triphasic           +--------+------------------+-----+-----------+--------+ PTA     254               1.64 multiphasic         +--------+------------------+-----+-----------+--------+ DP      248               1.60 monophasic           +--------+------------------+-----+-----------+--------+ +--------+------------------+-----+----------+--------+ Left    Lt Pressure (mmHg)IndexWaveform  Comment  +--------+------------------+-----+----------+--------+ Brachial                                 Movement +--------+------------------+-----+----------+--------+ PTA     254               1.64 monophasic         +--------+------------------+-----+----------+--------+ DP      254               1.64 monophasic         +--------+------------------+-----+----------+--------+ +-------+-----------+-----------+------------+------------+ ABI/TBIToday's ABIToday's TBIPrevious ABIPrevious TBI +-------+-----------+-----------+------------+------------+ Right  [place]                                             +-------+-----------+-----------+------------+------------+ Left   [place]                                             +-------+-----------+-----------+------------+------------+  Summary: Right: Resting right ankle-brachial index indicates noncompressible right lower extremity arteries. Unable to obtain TBI due to patient constant movement. Left: Resting left ankle-brachial index indicates noncompressible left lower extremity arteries. Unable to obtain TBI due to patient constant movement. *See table(s) above for measurements and observations.  Electronically signed by Servando Snare MD on 01/23/2022 at 5:32:31 PM.    Final       LOS: 7 days    Cordelia Poche, MD [facility] 01/24/2022, 3:24 PM   If 7PM-7AM, please contact night-coverage [email or web address]

## 2022-01-24 NOTE — Progress Notes (Signed)
Physical Therapy Treatment Patient Details Name: Albert Short MRN: [id number] DOB: January 23, 1941 Today's Date: 01/24/2022   History of Present Illness 81 year old male with afib, CAD, dCHF, HLD, and DM who presented with several months progressive generalized decline, weight loss and poor oral intake, memory loss, finally stopped eating and drinking and wouldn't get out of bed for last 2 weeks PTA so family called EMS.  Pt admitted 01/09/2022 for Acute renal failure superimposed on stage 3b chronic kidney disease, acute metabolic encephalopathy, failure to thrive.    PT Comments    Patient  alert initially and stated he wanted to walk. Extra time and multimodal cues to move to sitting with min/mod assist. Stood at Rw briefly and became less alert, staring affect.  RN to room, BP 114/69. Patient remained  somnolent  and less alert.    Recommendations for follow up therapy are one component of a multi-disciplinary discharge planning process, led by the attending physician.  Recommendations may be updated based on patient status, additional functional criteria and insurance authorization.  Follow Up Recommendations  Skilled nursing-short term rehab (<3 hours/day) (vs residential Hospice) Can patient physically be transported by private vehicle: No   Assistance Recommended at Discharge Frequent or constant Supervision/Assistance  Patient can return home with the following A little help with walking and/or transfers;A little help with bathing/dressing/bathroom;Help with stairs or ramp for entrance;Assist for transportation;Assistance with cooking/housework   Equipment Recommendations  None recommended by PT    Recommendations for Other Services       Precautions / Restrictions Precautions Precautions: Fall Restrictions Weight Bearing Restrictions: No     Mobility  Bed Mobility   Bed Mobility: Supine to Sit, Sit to Supine, Rolling Rolling: Min assist   Supine to sit: Min assist, HOB  elevated, +2 for safety/equipment Sit to supine: Min assist   General bed mobility comments: required moderate cues for sequencing and initiation, less responsive after standing    Transfers Overall transfer level: Needs assistance   Transfers: Sit to/from Stand Sit to Stand: Min assist, +2 safety/equipment, +2 physical assistance           General transfer comment: He required cues for hand placement and to scoot forward in order to get his feet on the floor, prior to standing attempt, patient with less responsiveness and assisted back into bed    Ambulation/Gait                   Stairs             Wheelchair Mobility    Modified Rankin (Stroke Patients Only)       Balance Overall balance assessment: History of Falls, Needs assistance Sitting-balance support: Bilateral upper extremity supported Sitting balance-Leahy Scale: Poor                                      Cognition Arousal/Alertness: Awake/alert Behavior During Therapy: Flat affect Overall Cognitive Status: Impaired/Different from baseline Area of Impairment: Problem solving, Following commands, Attention                       Following Commands: Follows one step commands with increased time       General Comments: delayed initiation of tasks, periodic phases out, after standing became less responsive and assisted back to bed, remoianed mostly somnolent thereafter        Exercises  General Comments        Pertinent Vitals/Pain Pain Assessment Pain Assessment: PAINAD Breathing: normal Negative Vocalization: none Facial Expression: smiling or inexpressive Body Language: relaxed Consolability: no need to console PAINAD Score: 0 Pain Intervention(s): Monitored during session    Home Living                          Prior Function            PT Goals (current goals can now be found in the care plan section) Progress towards PT  goals: Not progressing toward goals - comment    Frequency    Min 2X/week      PT Plan Current plan remains appropriate    Co-evaluation PT/OT/SLP Co-Evaluation/Treatment: Yes Reason for Co-Treatment: Complexity of the patient's impairments (multi-system involvement);Necessary to address cognition/behavior during functional activity;For patient/therapist safety PT goals addressed during session: Mobility/safety with mobility OT goals addressed during session: ADL's and self-care      AM-PAC PT "6 Clicks" Mobility   Outcome Measure  Help needed turning from your back to your side while in a flat bed without using bedrails?: A Little Help needed moving from lying on your back to sitting on the side of a flat bed without using bedrails?: A Little Help needed moving to and from a bed to a chair (including a wheelchair)?: A Lot Help needed standing up from a chair using your arms (e.g., wheelchair or bedside chair)?: A Lot Help needed to walk in hospital room?: Total Help needed climbing 3-5 steps with a railing? : Total 6 Click Score: 12    End of Session Equipment Utilized During Treatment: Gait belt Activity Tolerance: Treatment limited secondary to medical complications (Comment) Patient left: in bed;with call bell/phone within reach;with bed alarm set Nurse Communication: Mobility status PT Visit Diagnosis: Difficulty in walking, not elsewhere classified (R26.2);Muscle weakness (generalized) (M62.81);Adult, failure to thrive (R62.7)     Time: 5366-4403 PT Time Calculation (min) (ACUTE ONLY): 18 min  Charges:  $Therapeutic Activity: 8-22 mins                     Tresa Endo PT Acute Rehabilitation Services Office [phone] Weekend [phone]    Claretha Cooper 01/24/2022, 1:05 PM

## 2022-01-24 NOTE — Progress Notes (Signed)
  Daily Progress Note   Patient Name: Albert Short       Date: 01/24/2022 DOB: [birth year]  Age: 81 y.o. MRN#: [id number] Attending Physician: Mariel Aloe, MD Primary Care Physician: Leeroy Cha, MD Admit Date: 01/25/2022 Length of Stay: 7 days  Reason for Consultation/Follow-up: Establishing goals of care  As per EMR review, PMT provider Dr. Rowe Pavy are saw patient and family on 01/23/2022 and conducted a family meeting.  TOC was consulted at that time due to wife's preference for residential hospice at [facility].  [place] hospice liaison contacted by [facility] today.  As per [facility] documentation from hospice liaison today, referral has been received and is on a review by [facility] physician to determine hospice and [facility] eligibility.  Palliative care team will continue to follow along with patient's medical journey.  Chelsea Aus, DO Palliative Care Provider PMT # [phone]

## 2022-01-24 NOTE — TOC Progression Note (Addendum)
Transition of Care [facility]) - Progression Note    Patient Details  Name: Albert Short MRN: [id number] Date of Birth: 01/30/1941  Transition of Care [facility]) CM/SW Contact  Leeroy Cha, RN Phone Number: 01/24/2022, 11:34 AM  Clinical Narrative:    Referral to [facility] with Roselee Nova started at 1125. Tct-granddaughter-message left on voice mail at 1505 to return call for questions. Tcf-grand daughter-pt has not been in restraints for the past 12 hours. Wife is considering palliative care.   Expected Discharge Plan: Home/Self Care Barriers to Discharge: Continued Medical Work up  Expected Discharge Plan and Services Expected Discharge Plan: Home/Self Care   Discharge Planning Services: CM Consult   Living arrangements for the past 2 months: Single Family Home                                       Social Determinants of Health (SDOH) Interventions    Readmission Risk Interventions   No data to display

## 2022-01-24 NOTE — Progress Notes (Signed)
Occupational Therapy Treatment Patient Details Name: Albert Short MRN: [id number] DOB: 06/26/1940 Today's Date: 01/24/2022   History of present illness 81 year old male with afib, CAD, dCHF, HLD, and DM who presented with several months progressive generalized decline, weight loss and poor oral intake, memory loss, finally stopped eating and drinking and wouldn't get out of bed for last 2 weeks PTA so family called EMS.  Pt admitted 01/12/2022 for Acute renal failure superimposed on stage 3b chronic kidney disease, acute metabolic encephalopathy, failure to thrive.   OT comments  Patient presented with slight lethargy, impaired sustained attention, and delayed initiation of tasks. He also required increased time and repetition for simple command follow. He required min assist for rolling in bed, as well as for supine to sit, then min assist x2 to stand using a RW. Shortly after standing, he proceeded to return to sitting then supine; he was subsequently noted to be with decreased arousal for a short duration. His vitals were take while he was in supine & noted to be: 114/69, 63 bpm, 91% on room air (nurse informed of incident & present in room as vitals were taken). Once back in the near high fowler's position, he performed self-feeding, requiring min hand-over-hand assist, with cues for initiation and arousal. Continue OT plan of care.    Recommendations for follow up therapy are one component of a multi-disciplinary discharge planning process, led by the attending physician.  Recommendations may be updated based on patient status, additional functional criteria and insurance authorization.    Follow Up Recommendations  Skilled nursing-short term rehab (<3 hours/day)     Assistance Recommended at Discharge Frequent or constant Supervision/Assistance  Patient can return home with the following  A lot of help with bathing/dressing/bathroom;Direct supervision/assist for medications management;A  lot of help with walking and/or transfers;Direct supervision/assist for financial management;Assistance with cooking/housework;Assist for transportation   Equipment Recommendations  Other (comment) (to be determined pending progress at next setting)       Precautions / Restrictions Precautions Precautions: Fall Restrictions Weight Bearing Restrictions: No       Mobility Bed Mobility Overal bed mobility: Needs Assistance Bed Mobility: Supine to Sit, Sit to Supine, Rolling Rolling: Min assist   Supine to sit: Min assist, HOB elevated, +2 for safety/equipment Sit to supine: Min assist   General bed mobility comments: required moderate cues for sequencing and initiation    Transfers Overall transfer level: Needs assistance Equipment used: Rolling walker (2 wheels) Transfers: Sit to/from Stand Sit to Stand: Min assist, +2 safety/equipment, +2 physical assistance           General transfer comment: He required cues for hand placement and to scoot forward in order to get his feet on the floor, prior to standing attempt     Balance            ADL either performed or assessed with clinical judgement   ADL   Eating/Feeding: Bed level;Minimal assistance Eating/Feeding Details (indicate cue type and reason): He required min hand-over-hand assist for self-feeding in bed. He also required verbal cues for arousal and initiation of tasks.             Upper Body Dressing : Moderate assistance   Lower Body Dressing: Maximal assistance                        Cognition Arousal/Alertness:  (Slight lethargy) Behavior During Therapy: Flat affect   Area of Impairment: Problem solving,  Following commands, Attention          Following Commands: Follows one step commands with increased time and frequent repetition       General Comments: delayed initiation of tasks                   Pertinent Vitals/ Pain       Pain Assessment Pain Assessment:  No/denies pain         Frequency  Min 2X/week        Progress Toward Goals  OT Goals(current goals can now be found in the care plan section)     Acute Rehab OT Goals Patient Stated Goal: he did not specifically state OT Goal Formulation: With patient Time For Goal Achievement: 02/02/22 Potential to Achieve Goals: [place] Discharge plan remains appropriate    Co-evaluation    PT/OT/SLP Co-Evaluation/Treatment: Yes Reason for Co-Treatment: Necessary to address cognition/behavior during functional activity;For patient/therapist safety PT goals addressed during session: Mobility/safety with mobility OT goals addressed during session: ADL's and self-care      AM-PAC OT "6 Clicks" Daily Activity     Outcome Measure   Help from another person eating meals?: A Little Help from another person taking care of personal grooming?: A Little Help from another person toileting, which includes using toliet, bedpan, or urinal?: A Lot Help from another person bathing (including washing, rinsing, drying)?: A Lot Help from another person to put on and taking off regular upper body clothing?: A Lot Help from another person to put on and taking off regular lower body clothing?: A Lot 6 Click Score: 14    End of Session Equipment Utilized During Treatment: Gait belt;Rolling walker (2 wheels)  OT Visit Diagnosis: Unsteadiness on feet (R26.81);Muscle weakness (generalized) (M62.81)   Activity Tolerance Other (comment) (Limited by decreased arousal and presumed dizziness, noted after pt stood at bedside)   Patient Left in bed;with call bell/phone within reach;with bed alarm set   Nurse Communication Mobility status        Time: 0935-1000 OT Time Calculation (min): 25 min  Charges: OT General Charges $OT Visit: 1 Visit OT Treatments $Self Care/Home Management : 8-22 mins     Leota Sauers, OTR/L 01/24/2022, 10:12 AM

## 2022-01-25 ENCOUNTER — Other Ambulatory Visit (HOSPITAL_COMMUNITY): Payer: Self-pay

## 2022-01-25 DIAGNOSIS — N2889 Other specified disorders of kidney and ureter: Secondary | ICD-10-CM

## 2022-01-25 DIAGNOSIS — R451 Restlessness and agitation: Secondary | ICD-10-CM

## 2022-01-25 DIAGNOSIS — Z7189 Other specified counseling: Secondary | ICD-10-CM | POA: Diagnosis not present

## 2022-01-25 DIAGNOSIS — Z79899 Other long term (current) drug therapy: Secondary | ICD-10-CM

## 2022-01-25 DIAGNOSIS — N179 Acute kidney failure, unspecified: Secondary | ICD-10-CM

## 2022-01-25 DIAGNOSIS — E43 Unspecified severe protein-calorie malnutrition: Secondary | ICD-10-CM | POA: Diagnosis not present

## 2022-01-25 DIAGNOSIS — Z515 Encounter for palliative care: Secondary | ICD-10-CM | POA: Diagnosis not present

## 2022-01-25 DIAGNOSIS — R7401 Elevation of levels of liver transaminase levels: Secondary | ICD-10-CM

## 2022-01-25 DIAGNOSIS — I82220 Acute embolism and thrombosis of inferior vena cava: Secondary | ICD-10-CM | POA: Diagnosis not present

## 2022-01-25 DIAGNOSIS — R531 Weakness: Secondary | ICD-10-CM | POA: Diagnosis not present

## 2022-01-25 DIAGNOSIS — R338 Other retention of urine: Secondary | ICD-10-CM

## 2022-01-25 DIAGNOSIS — G9341 Metabolic encephalopathy: Secondary | ICD-10-CM | POA: Diagnosis not present

## 2022-01-25 LAB — GLUCOSE, CAPILLARY
Glucose-Capillary: 179 mg/dL — ABNORMAL HIGH (ref 70–99)
Glucose-Capillary: 163 mg/dL — ABNORMAL HIGH (ref 70–99)

## 2022-01-25 MED ORDER — BIOTENE DRY MOUTH MT LIQD: 15.0000 mL | OROMUCOSAL | 0 refills | Status: AC | PRN

## 2022-01-25 MED ORDER — HALOPERIDOL LACTATE 2 MG/ML PO CONC: 2.0000 mg | Freq: Four times a day (QID) | ORAL | Status: DC | PRN

## 2022-01-25 MED ORDER — TRAMADOL 5 MG/ML ORAL SUSPENSION: 50.0000 mg | Freq: Four times a day (QID) | ORAL | Status: DC | PRN

## 2022-01-25 MED ORDER — TRAMADOL HCL 50 MG PO TABS: 50.0000 mg | ORAL_TABLET | Freq: Four times a day (QID) | ORAL | Status: DC | PRN

## 2022-01-25 MED ORDER — MORPHINE SULFATE 15 MG PO TABS: 15.0000 mg | ORAL_TABLET | ORAL | 0 refills | Status: AC | PRN

## 2022-01-25 MED ORDER — QUETIAPINE FUMARATE 25 MG PO TABS: 12.5000 mg | ORAL_TABLET | Freq: Every day | ORAL | 0 refills | Status: AC

## 2022-01-25 MED ORDER — BIOTENE DRY MOUTH MT LIQD: 15.0000 mL | OROMUCOSAL | Status: DC | PRN

## 2022-01-25 MED ORDER — HALOPERIDOL LACTATE 2 MG/ML PO CONC: 0.5000 mg | ORAL | Status: DC | PRN

## 2022-01-25 MED ORDER — HALOPERIDOL LACTATE 5 MG/ML IJ SOLN: 0.5000 mg | INTRAMUSCULAR | Status: DC | PRN

## 2022-01-25 MED ORDER — POLYVINYL ALCOHOL 1.4 % OP SOLN: 1.0000 [drp] | Freq: Four times a day (QID) | OPHTHALMIC | Status: DC | PRN

## 2022-01-25 MED ORDER — HALOPERIDOL LACTATE 2 MG/ML PO CONC: 2.0000 mg | ORAL | 0 refills | Status: AC | PRN

## 2022-01-25 NOTE — Progress Notes (Signed)
Daily Progress Note   Patient Name: Albert Short       Date: 01/25/2022 DOB: Aug 23, 1940  Age: 81 y.o. MRN#: [id number] Attending Physician: Edwin Dada, * Primary Care Physician: Leeroy Cha, MD Admit Date: 01/27/2022 Length of Stay: 8 days  Reason for Consultation/Follow-up: Establishing goals of care  Subjective:   CC: Patient lying in bed confused though able to sip water on his own once given.  Discussed care with patient's family including wife and daughter at bedside.  Following up regarding complex medical decision making.  Subjective:  Reviewed EMR prior to seeing patient.  Patient's kidney function worsening with creatinine rising to 2.24.   Plan had been for patient to be evaluated by hospice liaison for possible G[facility] admission [facility].  Patient is not currently appropriate for GIP admission.  Family unable to afford residential cost for admission. Discussed care with hospitalist and care team including hospice liaison and to see prior to presenting to bedside.  Discussed care with bedside RN prior to seeing patient.  When presenting to bedside, patient laying calmly in bed.  Patient is confused at times though able to ask for simple things such as water and drink it on his own once the cup is handed to him.  Not able to participate in complex medical decision-making at this time. Resident at patient's bedside was patient's wife and daughter.  Introduced myself as a member of the palliative care team and colleague of Dr. Inda Castle.  They remembered discussing care with Dr. Rowe Pavy previously.  Required about most recent updates.  They describe that they have heard from hospice liaison that patient is not appropriate for G[facility] admission [facility].  They also noted they cannot afford the residential cost of $300 per day for residential hospice.  They also describe that they had talked with the hospitalist about adjusting patient's Seroquel to allow  patient to be more awake during the day.  Agreed with this plan.  They describe that since patient has been more awake today, he has been eating and drinking more.  Still confused though more interactive.  Will note that during discussion patient was trying to get out of bed at times so had to address this multiple times.  ------------------------------------------------------------------------------------------------------------- Advance Care Planning Conversation  Pertinent diagnosis: likely renal carcinoma, failure thrive, worsening renal function  The patient and/or family consented to a voluntary Advance Care Planning Conversation. Individuals present for the conversation: Patient unable to participate in conversation due to encephalopathy.  Discussed care with patient's wife and daughter at bedside.  This palliative provider was present for already of discussion.  Summary of the conversation:  Able to discuss with family goals for overall medical care moving forward.  They confirmed that they would like to focus on patient's comfort moving forward.  We discussed the most supportive option for focusing on patient's comfort at the end of the [facility] life is with hospice.  Discussed that majority of hospice is actually provided in the home.  Discussed the idea of home hospice support and what this would entail and would not entail in generalities.  Also discussed that when patients are at home with hospice, the majority of care does fall to family members.  Wife will be assisting with patient's care and daughter plans to move in with them to also assist in patient's care.  They will involve family as able to support care.  Discussed that as the journey moves along, hospice team does have a  social worker who can also assist with providing resources as able.  Did state that there could be an out-of-pocket cost should they want to hire additional caregivers.  Discussed benefits of rotating family  members and developing a schedule to allow for caregivers to have breaks at times.  Family agreement with discussing getting home with hospice support with [facility] liaison who they met earlier in the day.  Discussed that with the idea of focusing on patient's comfort, would recommend focusing on interventions that alleviate patient's symptoms and discontinuing interventions that are no longer changing the overall prognosis such as lab work, imaging, and IV fluids.  Discussed how in the setting of end-of-life and worsening renal function, continuing things like IV fluids can actually cause fluid to accumulate in areas they are not too supposed to such as the lungs which can worsen symptoms of dyspnea.  Family agreement with transitioning to comfort focused care at this time.  Outcome of the conversations and/or documents completed:  Transition to comfort focused care; planning to get home with hospice support  I spent 28 minutes providing separately identifiable ACP services with the patient and/or surrogate decision maker in a voluntary, in-person conversation discussing the patient's wishes and goals as detailed in the above note.  Chelsea Aus, DO Palliative Care Provider  -------------------------------------------------------------------------------------------------------------  Spent time discussing in detail the dying process.  Spent time providing emotional support and active listening.  All questions answered at that time.  Noted would reach out to other care team members to coordinate patient getting home with hospice as soon as able.  Wife and daughter voiced appreciation for visit today.  Review of Systems Patient's confused and unable to reliably answer questions. Objective:   Vital Signs:  BP 137/84 (BP Location: Left Arm)   Pulse 70   Temp (!) 97.4 F (36.3 C) (Axillary)   Resp 19   Ht _0  (1.753 m)   Wt 83.1 kg   SpO2 99%   BMI 27.05 kg/m   Physical Exam: General:  Chronically ill-appearing, awake, confused, frail Eyes: No drainage noted HENT: Moist mucous membranes Cardiovascular: RRR, no edema in LE b/l Respiratory: no increased work of breathing noted, not in respiratory distress Abdomen: not distended Extremities: Moves all extremities spontaneously Skin: no rashes or lesions on visible skin Neuro: Awake, confused  Imaging: I personally reviewed recent imaging.   Assessment & Plan:   Assessment: Patient is an 81 year old gentleman with a past medical history of A-fib, coronary artery disease, CHF, dyslipidemia, and diabetes who was admitted on 01/29/2022 for management of generalized functional decline, weight loss, memory loss.  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  Palliative care following up regarding complex medical decision making.  Recommendations/Plan: # Complex medical decision making/goals of care:  - Patient unable to participate in complex medical decision-making due to current medical status.  -Spoke with patient's wife and daughter at bedside today as described above in detail in HPI.  Family agreed with transition to comfort focused care at this time.  At this time we will discontinue interventions that are no longer focused on comfort such as IV fluids, imaging, or lab work.  Will instead focus on symptom management of pain, dyspnea, and agitation in the setting of end-of-life care. -Family planning to take patient home with hospice support.   -Able to  assist with care coordination by contacting hospice liaison, primary hospitalist, and case manager regarding patient going home with hospice support.  -  Code Status: DNR Prognosis: weeks to months (prognosis of 6 months of less, appropriate for hospice care)  # Symptom  management:  Pain   -Patient currently has tramadol 50 mg every 6 hours ordered as needed.  Patient has been receiving tramadol since 02/2020 as per PDMP review.  If patient was not already on tramadol, would instead recommend liquid oxycodone 2.5-82m q4hrs prn for pain management.  Do not use morphine in setting of impaired renal function. As do not want to risk tramadol withdrawal, will currently continue tramadol dosing.  May need transition to other medication in the future.   -Continue bowel regiment while receiving opioids.   Agitation/delirium   -Ordered Haldol 0.5 mg sublingual or IV every 4 hours as needed.  May need to further adjust based on patient's symptom burden.   -Continuing Seroquel 12.5 mg nightly.  # Psychosocial Support:  -Wife, daughter, son  # Discharge Planning[place]: patient's family, TOC/CM, hospice liaison, hospitalist   Thank you for allowing the palliative care team to participate in the care [place]  LChelsea Aus DO Palliative Care Provider PMT # [phone] If patient remains symptomatic despite maximum doses, please call PMT at [phone] 0700 and 1900. Outside of these hours, please call attending, as PMT does not have night coverage.

## 2022-01-25 NOTE — Progress Notes (Addendum)
Manufacturing engineer [facility]) Hospital Liaison Note  Plan is to now discharge home with hospice services via [facility].   DME has been ordered and plan is for it to be delivered on 11.23 at 10am.   Please send patient home with comfort prescriptions/medications at discharge.   Please call with any questions or concerns. Thank you  Roselee Nova, [facility] Liaison [phone]

## 2022-01-25 NOTE — Progress Notes (Signed)
  Progress Note   Patient: Albert Short PRX:[id number] DOB: [birth year] DOA: 01/29/2022     8 DOS: the patient was seen and examined on 01/25/2022 at 12:13PM      Brief hospital course: Albert Short is a 81 y.o. male with a history of atrial fibrillation on Coumadin, CAD, diastolic heart failure, hyperlipidemia and diabetes mellitus. He presented secondary to progressive generalized decline and weight loss of several months and admitted for AKI. 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 Urology and palliative care consulted. Admission complicated by delirium. After continued goals of care discussions, patient transitioned to DNR/DNI with plan to transfer to hospice facility.     Assessment and Plan: * Acute renal failure superimposed on stage 3b chronic kidney disease (HCC) Acute metabolic encephalopathy Terminal delirium FTT (failure to thrive) in adult Acute urinary retention Elevated troponin Renal mass 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HCC) Acute metabolic acidosis Likely dementia Protein-calorie malnutrition, severe Supratherapeutic INR Essential hypertension Chronic systolic CHF (congestive heart failure) (HCC) DM type 2 (diabetes mellitus, type 2) (HCC) CAD (coronary artery disease) Hypercholesterolemia Paroxysmal atrial fibrillation [facility])  Patient awaiting Hospice arrangments for post-discharge planning.  -Continue amlodipine, metoprolol - Continue Seroquel - As needed Haldol for delirium, morphine for pain        Subjective: No complaints, more awake today.  Eating beans and franks with his family.     Physical Exam: BP 137/84 (BP Location: Left Arm)   Pulse 70   Temp (!) 97.4 F (36.3 C) (Axillary)   Resp 19   Ht '5\' 9"'$  (1.753 m)   Wt 83.1 kg   SpO2 99%   BMI 27.05 kg/m   Thin elderly adult male, lying in bed, interactive but confused, does not know who I  am removed from the computer to the sink RRR, no murmurs, no peripheral edema Respiratory rate normal, lungs clear without rales or wheezes    Data Reviewed: Discussed with palliative care No new labs  Family Communication: Wife and daughter at the bedside    Disposition: Status is: Inpatient Patient is medically ready for discharge as soon as hospitals are able to make home arrangements        Author: Edwin Dada, MD 01/25/2022 4:14 PM  For on call review [email or web address].

## 2022-01-25 NOTE — TOC Transition Note (Signed)
Transition of Care [facility]) - CM/SW Discharge Note   Patient Details  Name: Albert Short MRN: [id number] Date of Birth: 10/22/1940  Transition [facility]) CM/SW Contact:  Leeroy Cha, RN Phone Number: 01/25/2022, 3:21 PM   Clinical Narrative:     Plan is to send patient home with afternoon with hospice.  Roselee Nova with [facility] has ordered the hospital bed.  Awaiting the delivery of this.  Packet for ptar transport fill out and placed at nurses station.  Dnr sheet to be signed by md on the front of packet.  RN or Fabio Pierce is to call ptar when bed is delivered. All parties made aware.    Barriers to Discharge: Continued Medical Work up   Patient Goals and CMS Choice Patient states their goals for this hospitalization and ongoing recovery are:: to get better CMS Medicare.gov Compare Post Acute Care list provided to:: Patient    Discharge Placement                       Discharge Plan and Services   Discharge Planning Services: CM Consult                                 Social Determinants of Health (SDOH) Interventions     Readmission Risk Interventions   No data to display

## 2022-01-26 DIAGNOSIS — N179 Acute kidney failure, unspecified: Secondary | ICD-10-CM | POA: Diagnosis not present

## 2022-01-26 DIAGNOSIS — N1832 Chronic kidney disease, stage 3b: Secondary | ICD-10-CM | POA: Diagnosis not present

## 2022-01-26 LAB — PROTIME-INR
INR: 7.4 (ref 0.8–1.2)
Prothrombin Time: 62.5 seconds — ABNORMAL HIGH (ref 11.4–15.2)

## 2022-01-26 MED ORDER — VITAMIN K1 10 MG/ML IJ SOLN
1.0000 mg | Freq: Once | INTRAVENOUS | Status: AC
  Administered 2022-01-26: 1 mg via INTRAVENOUS
  Filled 2022-01-26: qty 0.1

## 2022-01-26 NOTE — Progress Notes (Signed)
[facility] [employer] [facility]) Hospital Liaison Note  Phone call to Ms. Jacinto this morning to follow up on transition to home with hospice. Ms. Emily states that she feels she cannot take patient home, that it is too much for her. She requests DME delivery be canceled.   Ms. Nace shares she would like to pursue LTC with hospice support. I discussed that LTC would incur out of pocket costs to cover room and board and would likely have to be paid privately. Ms. Sellen insists that her insurance would cover LTC for 100 days. Respectfully, defer this conversation to [facility] and informed Ms. Force that we would support her husband and family in whatever setting is best determined for discharge.  [facility] liaisons will continue to follow for discharge disposition.  Please call with any hospice related questions or concerns.  Thank you, Margaretmary Eddy, BSN, RN [facility] Liaison [phone]

## 2022-01-26 NOTE — Plan of Care (Signed)
  Problem: Education: Goal: Ability to describe self-care measures that may prevent or decrease complications (Diabetes Survival Skills Education) will improve Outcome: Progressing   Problem: Metabolic: Goal: Ability to maintain appropriate glucose levels will improve Outcome: Progressing   Problem: Nutritional: Goal: Maintenance of adequate nutrition will improve Outcome: Progressing   Problem: Clinical Measurements: Goal: Ability to maintain clinical measurements within normal limits will improve Outcome: Progressing

## 2022-01-26 NOTE — Progress Notes (Signed)
       CROSS COVER NOTE  NAME: Albert Short MRN: [id number] DOB : 03/21/1940    Date of Service   01/26/2022   HPI/Events of Note   Notified by bedside RN of critical lab value INR 7.4. INR rending up compared to previous 5.2 level on 11/21. Hgb 13.5 on 11/21.  RN denies signs of bleeding.  Last vitamin K dose was given on 11/20.  No clinical changes noted during daytime.  At this time family wishes to pursue rehabilitation.  Goals of care are not to work up or treat the possible renal cancer with metastases, and not to further evaluate the possible IVC mass.   Pharmacy consulted for warfarin management. INR levels have remained supratherapeutic despite holding warfarin and previous vitamin K administration.  1 mg IV vitamin K ordered.   Interventions/ Plan   1 mg IV vitamin K        Raenette Rover, DNP, [place]

## 2022-01-26 NOTE — Progress Notes (Signed)
ANTICOAGULATION CONSULT NOTE  Pharmacy Consult for warfarin Indication: atrial fibrillation  No Known Allergies  Patient Measurements: Height: '5\' 9"'$  (175.3 cm) Weight: 83.1 kg (183 lb 3.2 oz) IBW/kg (Calculated) : 70.7  Vital Signs: Temp: 97.7 F (36.5 C) (11/23 1227) Temp Source: Axillary (11/23 1227) BP: 148/68 (11/23 1227) Pulse Rate: 59 (11/23 1227)  Labs: Recent Labs    01/24/22 0405  HGB 13.5  HCT 43.6  PLT 209  LABPROT 47.8*  INR 5.2*  CREATININE 2.24*     Estimated Creatinine Clearance: 25.4 mL/min (A) (by C-G formula based on SCr of 2.24 mg/dL (H)).   Assessment: 64 yoM with PMH Afib on warfarin PTA, CHF, CAD, DM2, HTN, admitted 11/13 for weakness, weight loss, and general decline. Noted to have elevated INR and SCr on admission. Given Vitamin K 10 mg IV x 1, and Pharmacy consulted to dose warfarin once appropriate.  Baseline INR markedly elevated, likely d/t recent poor PO intake/weight loss (down ~16 kg since this time last year) Prior anticoagulation: warfarin 5 mg daily; last dose unknown (missed several days prior to 11/13 admission)  Significant events: - 11/13: Vit K '10mg'$  IV x1 - 11/20: Vit K 2.5 mg PO x1 - 11/23: Disposition finalized; Pharmacy consulted to resume warfarin dosing as appropriate  Today, 01/26/2022: INR remains SUPRAtherapeutic and increased, despite holding warfarin and reversing with PO Vit K CBC WNL on 11/21; f/u CBC ordered for tomorrow AM Major drug interactions: none No bleeding issues documented  Goal of Therapy: INR 2-3  Plan: No warfarin today Daily INR; f/u and resume warfarin as appropriate Monitor for signs of bleeding or thrombosis  Reuel Boom, PharmD, BCPS [phone] 01/26/2022, 7:12 PM

## 2022-01-26 NOTE — Progress Notes (Signed)
  Daily Progress Note   Patient Name: Albert Short       Date: 01/26/2022 DOB: 1940-10-13  Age: 81 y.o. MRN#: [id number] Attending Physician: Edwin Dada, * Primary Care Physician: Leeroy Cha, MD Admit Date: 02/01/2022 Length of Stay: 9 days  Reason for Consultation/Follow-up: Establishing goals of care  Providers had coordinated care with plan to get patient home today with [facility] hospice support.  When hospice liaison following up this a.m., was informed family has now decided that they want to seek placement for patient instead of bringing him home as they had previously stated.  TOC assisting with coordination regarding placement and supports for care.  PMT will continue to follow along with patient's medical course.  Chelsea Aus, DO Palliative Care Provider PMT # [phone]

## 2022-01-26 NOTE — Progress Notes (Signed)
CSW spoke to the family, the family was under the impression that the insurance was going to cover a full 100 days. However Medicare will cover 20 days and after 20 to 100 days they will possibly run into co-pay days. I have advised the family to follow up with the insurance company tomorrow. The family also needs to pick a facility of their liking for tomorrow. Family stated that they do not have the funds to pay out of pocket, TOC will have to continue to follow for any further needs to support this family for safe and supportive DC.

## 2022-01-26 NOTE — Progress Notes (Signed)
  Progress Note   Patient: Albert Short:[id number] DOB: Nov 16, 1940 DOA: 01/23/2022     9 DOS: the patient was seen and examined on 01/26/2022 at 12:13PM      Brief hospital course: Albert Short is a 81 y.o. male with a history of atrial fibrillation on Coumadin, CAD, diastolic heart failure, hyperlipidemia and diabetes mellitus. He presented secondary to progressive generalized decline and weight loss of several months and admitted for AKI. 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 Urology and palliative care consulted. Admission complicated by delirium. After continued goals of care discussions, patient transitioned to DNR/DNI with plan to transfer to hospice facility.     Assessment and Plan: * Acute renal failure superimposed on stage 3b chronic kidney disease (HCC) Acute metabolic encephalopathy Terminal delirium FTT (failure to thrive) in adult Acute urinary retention Elevated troponin Renal mass 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HCC) Acute metabolic acidosis Likely dementia Protein-calorie malnutrition, severe Supratherapeutic INR Essential hypertension Chronic systolic CHF (congestive heart failure) (HCC) DM type 2 (diabetes mellitus, type 2) (HCC) CAD (coronary artery disease) Hypercholesterolemia Paroxysmal atrial fibrillation (HCC)  No clinical change, awaiting discharge planning Family have informed team they wish to pursue rehabilitation.   Goals of care are not to work up or treat the possible renal cancer with metastases, and not to further evaluate the possible IVC mass  - Resume warfarin  - Continue metoprolol and amlodipine - Continue Seroquel - Continue morphine as needed for pain, Haldol for delirium   - Glucoses had been well controlled, will defer accuchecks for now, recommend them daily at SNF  - Check CBC and BMP tomorrow  - Standard delirium  precautions: blinds open and lights on during day, TV off, minimize interruptions at night, glasses/hearing aids, PT/OT, avoiding Beers list medications        Subjective: Patient is restless, appetite is okay     Physical Exam: BP (!) 148/68 (BP Location: Right Arm)   Pulse (!) 59   Temp 97.7 F (36.5 C) (Axillary)   Resp 20   Ht '5\' 9"'$  (1.753 m)   Wt 83.1 kg   SpO2 99%   BMI 27.05 kg/m   Thin adult male, lying in bed, does not cooperate with nursing cares    Data Reviewed: Discussed with palliative care No new labs  Family Communication: Wife and daughters at the bedside    Disposition: Status is: Inpatient Plan for discharge to SNF for rehab        Author: Edwin Dada, MD 01/26/2022 3:59 PM  For on call review [email or web address].

## 2022-01-27 DIAGNOSIS — N179 Acute kidney failure, unspecified: Secondary | ICD-10-CM | POA: Diagnosis not present

## 2022-01-27 DIAGNOSIS — N1832 Chronic kidney disease, stage 3b: Secondary | ICD-10-CM | POA: Diagnosis not present

## 2022-01-27 LAB — CBC
HCT: 47.3 % (ref 39.0–52.0)
Hemoglobin: 14.5 g/dL (ref 13.0–17.0)
MCH: 26.8 pg (ref 26.0–34.0)
MCHC: 30.7 g/dL (ref 30.0–36.0)
MCV: 87.3 fL (ref 80.0–100.0)
Platelets: 200 10*3/uL (ref 150–400)
RBC: 5.42 MIL/uL (ref 4.22–5.81)
RDW: 21.7 % — ABNORMAL HIGH (ref 11.5–15.5)
WBC: 8.6 10*3/uL (ref 4.0–10.5)
nRBC: 0 % (ref 0.0–0.2)

## 2022-01-27 LAB — BASIC METABOLIC PANEL
Anion gap: 13 (ref 5–15)
BUN: 34 mg/dL — ABNORMAL HIGH (ref 8–23)
CO2: 16 mmol/L — ABNORMAL LOW (ref 22–32)
Calcium: 9.1 mg/dL (ref 8.9–10.3)
Chloride: 118 mmol/L — ABNORMAL HIGH (ref 98–111)
Creatinine, Ser: 1.66 mg/dL — ABNORMAL HIGH (ref 0.61–1.24)
GFR, Estimated: 41 mL/min — ABNORMAL LOW (ref >60)
Glucose, Bld: 86 mg/dL (ref 70–99)
Potassium: 4.2 mmol/L (ref 3.5–5.1)
Sodium: 147 mmol/L — ABNORMAL HIGH (ref 135–145)

## 2022-01-27 MED ORDER — LORAZEPAM 2 MG/ML IJ SOLN: 1.0000 mg | INTRAMUSCULAR | Status: DC | PRN

## 2022-01-27 MED ORDER — MORPHINE SULFATE (PF) 2 MG/ML IV SOLN
2.0000 mg | INTRAVENOUS | Status: DC | PRN
  Filled 2022-01-27: qty 1

## 2022-01-27 MED ORDER — OXYCODONE HCL 5 MG PO TABS
5.0000 mg | ORAL_TABLET | ORAL | Status: DC | PRN
  Filled 2022-01-27: qty 1

## 2022-01-27 MED ORDER — LORAZEPAM 2 MG/ML PO CONC
2.0000 mg | ORAL | Status: DC | PRN
  Administered 2022-01-27: 2 mg via ORAL
  Filled 2022-01-27: qty 1

## 2022-01-27 MED ORDER — MORPHINE SULFATE (PF) 2 MG/ML IV SOLN: 2.0000 mg | INTRAVENOUS | Status: DC | PRN

## 2022-01-27 MED ORDER — MORPHINE SULFATE 10 MG/5ML PO SOLN
10.0000 mg | ORAL | Status: DC | PRN
  Administered 2022-01-27: 10 mg via ORAL
  Filled 2022-01-27: qty 5

## 2022-01-27 NOTE — Progress Notes (Signed)
2 mg of IV morphine wasted in [place] w/ Yong Channel, Therapist, sports. Patient lost IV access, so PO oral solution ordered to give.

## 2022-01-27 NOTE — Progress Notes (Signed)
Manufacturing engineer [facility]) Hospital Liaison Note  [facility] will continue to follow for disposition planning.   Please call with any questions or concerns. Thank you  Roselee Nova, [facility] Liaison [phone]

## 2022-01-27 NOTE — Progress Notes (Signed)
ANTICOAGULATION CONSULT NOTE  Pharmacy Consult for warfarin Indication: atrial fibrillation  No Known Allergies  Patient Measurements: Height: '5\' 9"'$  (175.3 cm) Weight: 83.1 kg (183 lb 3.2 oz) IBW/kg (Calculated) : 70.7  Vital Signs: Temp: 97.6 F (36.4 C) (11/24 0516) Temp Source: Oral (11/24 0516) BP: 113/95 (11/24 0516) Pulse Rate: 94 (11/24 0516)  Labs: Recent Labs    01/26/22 1701 01/27/22 0432  HGB  --  14.5  HCT  --  47.3  PLT  --  200  LABPROT 62.5*  --   INR 7.4*  --   CREATININE  --  1.66*     Estimated Creatinine Clearance: 34.3 mL/min (A) (by C-G formula based on SCr of 1.66 mg/dL (H)).   Assessment: 37 yoM with PMH Afib on warfarin PTA, CHF, CAD, DM2, HTN, admitted 11/13 for weakness, weight loss, and general decline. Noted to have elevated INR and SCr on admission. Given Vitamin K 10 mg IV x 1, and Pharmacy consulted to dose warfarin once appropriate.  Baseline INR markedly elevated, likely d/t recent poor PO intake/weight loss (down ~16 kg since this time last year) Prior anticoagulation: warfarin 5 mg daily; last dose unknown (missed several days prior to 11/13 admission)  Significant events: - 11/13: Vit K '10mg'$  IV x1 - 11/20: Vit K 2.5 mg PO x1 - 11/23: Disposition finalized; Pharmacy consulted to resume warfarin dosing as appropriate - 11/24: Vit K 1 mg IV x1   Today, 01/27/2022: INR not drawn today as labs says not enough blood received to run INR test  Spoke to Dr. Loleta Books - due to pt's current condition, do not attempt to redraw the INR and will hold warfarin dose for today  Vitamin K IV ordered and administered CBC WNL on 11/21; f/u CBC ordered for tomorrow AM Major drug interactions: none No bleeding issues documented  Goal of Therapy: INR 2-3  Plan: No warfarin today Daily INR; f/u and resume warfarin as appropriate Monitor for signs of bleeding or thrombosis   Royetta Asal, PharmD, BCPS 01/27/2022 10:12 AM

## 2022-01-27 NOTE — Progress Notes (Signed)
  Daily Progress Note   Patient Name: Albert Short       Date: 01/27/2022 DOB: 1940/08/07  Age: 81 y.o. MRN#: [id number] Attending Physician: Edwin Dada, * Primary Care Physician: Leeroy Cha, MD Admit Date: 01/06/2022 Length of Stay: 10 days  Discussed care with primary hospitalist today. Patient last seen by this palliative provider  on 01/25/22. At that time, family's goals for patient's care was comfort focused and hospice support.  While initially family determined to take patient home with hospice as he does not qualify for inpatient hospice level of care at this time, family has now decided that they are unable to provide adequate level of care at home for patient with hospice support.  Family reporting to providers unable to pay out-of-pocket cost associated with room and board for patient going to a facility with hospice.  At this time family has now adjusted goals and determined they would like to pursue patient going to rehab.  Primary hospitalist continuing discussions with family regarding this.  As primary hospitalist leading goals of care conversation, palliative care team will sign off. Please reach out if our team can be of further assistance in the future. Thank you for involving our team in patient's care.    Chelsea Aus, DO Palliative Care Provider PMT # [phone]

## 2022-01-27 NOTE — Plan of Care (Signed)
  Problem: Coping: Goal: Ability to adjust to condition or change in health will improve Outcome: Progressing   Problem: Skin Integrity: Goal: Risk for impaired skin integrity will decrease Outcome: Progressing

## 2022-01-27 NOTE — Progress Notes (Signed)
Progress Note   Patient: Albert Short IPJ:[id number] DOB: 07/09/1940 DOA: 01/08/2022     10 DOS: the patient was seen and examined on 01/27/2022 at 12:13PM      Brief hospital course: Mr. Mahon is an 82 y.o. M with Afib on warfarin, dCHF, and DM who presented with many months of progressive weight loss, worsening memory, and generalized functional decline.  In the ER he was found to have acute kidney injury, admitted on fluids.  He was subsequently found to have renal mass, IVC thrombosis versus mass, and persistent delirium.  Urology and palliative care were consulted and in discussion with family, it was clear and generally agreed upon that further work-up with the inevitable surgery required was not the patient's goals of care.  With that in mind and given his persistent delirium he was transitioned to comfort measures, and hospice were consulted.  He had several days of persistent somnolence and poor oral intake likely due to prescribed Seroquel, and a recommendation for inpatient hospice was made.  Seroquel was subsequently stopped, and he became more alert, had better oral intake, and hospice reported that he was not inpatient hospice eligible.  Family subsequently reviewed the available options, and reported that he would not wish for an attempt at rehabilitation prior to further hospice exploration.      Assessment and Plan: * Acute renal failure superimposed on stage 3b chronic kidney disease (HCC) Creatinine improved to 1.66 -Push oral fluids  Acute metabolic encephalopathy Terminal delirium FTT (failure to thrive) in adult Is acute metabolic encephalopathy appears to have slowly resolved into a terminal delirium. - Continue Seroquel at night - As needed Haldol during the day - Palliative to follow at SNF and transition to hospice if appropriate   Acute urinary retention Patient has developed some swelling around his penis from pulling and manipulating his  Foley catheter.  This does not appear to be infected or draining, and I recommend removal of the Foley but family declined.  Elevated troponin  Renal mass 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[place]) As above, urology were consulted, work-up involved extensive tissue sampling, invasive procedures, and likely surgery plus or minus chemo, none of which are within the goals of care of the patient per family.  Goals of care include not to work-up the known renal mass or IVC mass/thrombosis, to attempt rehab, but to be open to hospice if this fails, and to allow natural processes without further IV treatments, invasive procedures, or surgeries.    Acute metabolic acidosis Likely dementia Protein-calorie malnutrition, severe   Supratherapeutic INR Paroxysmal atrial fibrillation (HCC) INR went back up to 7.4 despite holding warfarin.  Unclear reason, I presume this is from malnutrition.  Vitamin K given last night, INR unable to be checked again this morning due to inability to draw blood. - Hold warfarin tonight - Check INR tomorrow  Essential hypertension Chronic systolic CHF (congestive heart failure) (HCC) -Continue metoprolol and amlodipine  DM type 2 (diabetes mellitus, type 2) (HCC) CAD (coronary artery disease) Hypercholesterolemia  Hypernatremia Push oral fluids   - Continue morphine as needed for pain, Haldol for delirium   - Glucoses had been well controlled, will defer accuchecks for now, recommend them daily at SNF  - Standard delirium precautions: blinds open and lights on during day, TV off, minimize interruptions at night, glasses/hearing aids, PT/OT, avoiding Beers list medications        Subjective: Patient has no complaints, no fever overnight, no respiratory distress or vomiting.  Physical Exam: BP 116/72 (BP Location: Left Arm)   Pulse 69   Temp 97.6 F (36.4 C) (Oral)   Resp 16   Ht '5\' 9"'$  (1.753 m)   Wt 83.1 kg   SpO2 100%   BMI 27.05 kg/m   Malnourished adult  male, lying in bed, curled up, no acute distress, appears disoriented RRR, no murmurs, no peripheral edema Respiratory rate normal, lungs clear without rales or wheezes Pain is foreskin has some swelling and edema, but no redness, discharge Attention diminished, affect blunted, disoriented and unable to state his name or the situation      Data Reviewed: Discussed with palliative care INR 7.4 Basic metabolic panel shows new hyponatremia Worsening metabolic acidosis, creatinine 1.6, improved from previous  Family Communication: Wife and daughters at the bedside    Disposition: Status is: Inpatient Plan for discharge to SNF for rehab        Author: Edwin Dada, MD 01/27/2022 2:29 PM  For on call review [email or web address].

## 2022-01-28 DIAGNOSIS — N1832 Chronic kidney disease, stage 3b: Secondary | ICD-10-CM | POA: Diagnosis not present

## 2022-01-28 DIAGNOSIS — N179 Acute kidney failure, unspecified: Secondary | ICD-10-CM | POA: Diagnosis not present

## 2022-01-28 LAB — CBC
HCT: 44.6 % (ref 39.0–52.0)
Hemoglobin: 12.6 g/dL — ABNORMAL LOW (ref 13.0–17.0)
MCH: 27.2 pg (ref 26.0–34.0)
MCHC: 28.3 g/dL — ABNORMAL LOW (ref 30.0–36.0)
MCV: 96.1 fL (ref 80.0–100.0)
Platelets: 204 10*3/uL (ref 150–400)
RBC: 4.64 MIL/uL (ref 4.22–5.81)
RDW: 22.4 % — ABNORMAL HIGH (ref 11.5–15.5)
WBC: 13.1 10*3/uL — ABNORMAL HIGH (ref 4.0–10.5)
nRBC: 0.2 % (ref 0.0–0.2)

## 2022-01-28 LAB — PROTIME-INR
INR: 8.4 (ref 0.8–1.2)
Prothrombin Time: 68.8 seconds — ABNORMAL HIGH (ref 11.4–15.2)

## 2022-01-28 MED ORDER — MORPHINE SULFATE (CONCENTRATE) 10 MG/0.5ML PO SOLN: 10.0000 mg | ORAL | Status: DC | PRN

## 2022-02-03 NOTE — Plan of Care (Signed)
Patient on comfort care, family concerned about restlessness and pain symptoms,  ativan and morpnine given orally, patient repositioned higher in bed.  Calm and still lying in bed. No s/s of pain.

## 2022-02-03 NOTE — Progress Notes (Signed)
ANTICOAGULATION CONSULT NOTE - Follow Up Consult  Pharmacy Consult for Warfarin Indication:  IVC thrombus  No Known Allergies  Patient Measurements: Height: '5\' 9"'$  (175.3 cm) Weight: 83.1 kg (183 lb 3.2 oz) IBW/kg (Calculated) : 70.7  Vital Signs:    Labs: Recent Labs    01/26/22 1701 01/27/22 0432 2022-02-14 0742  HGB  --  14.5 12.6*  HCT  --  47.3 44.6  PLT  --  200 204  LABPROT 62.5*  --  68.8*  INR 7.4*  --  8.4*  CREATININE  --  1.66*  --     Estimated Creatinine Clearance: 34.3 mL/min (A) (by C-G formula based on SCr of 1.66 mg/dL (H)).   Assessment: AC/Heme: warf for IVC thrombus - 11/13: INR 7.6, bloody urine - Vit K '10mg'$  - 11/14: INR 1.5 - warfarin not given (no reason determined) - 11/20 INR 7.2 (Vit K po) --> 11/21 INR 5.2 - 11/23 INR 7.4 - 11/24 No INR  - 11/25: INR 8.4, Hgb 12.6 relatively stable. Plts stable.  Goal of Therapy:  INR 2-3 Monitor platelets by anticoagulation protocol: Yes   Plan:  Holding warfarin Daily INR  Albert Short, PharmD, BCPS Clinical Staff Pharmacist [email or web address] Alford Short, [employer] 02-14-22,10:27 AM

## 2022-02-03 NOTE — Progress Notes (Signed)
Patient's respirations have changed to Cheyne-Stokes. Family at bedside. Comfort care continued.Eulas Post, RN

## 2022-02-03 NOTE — Progress Notes (Signed)
Family notified RN that patient did not appear to be breathing. Md on floor and confirmed.Eulas Post, RN

## 2022-02-03 NOTE — Death Summary Note (Signed)
DEATH SUMMARY   Patient Details  Name: Albert Short MRN: [id number] DOB: [birth year] VOZ:[id number], Ronie Spies, MD Admission/Discharge Information   Admit Date:  02-04-2022  Date of Death:    Time of Death:    Length of Stay: Jun 03, 2022   Principle Cause of death: Suspected renal cancer     Hospital Diagnoses: Principal Problem:   Acute renal failure superimposed on stage 3b chronic kidney disease ([place]) Active Problems:   Acute metabolic encephalopathy   FTT (failure to thrive) in adult   Paroxysmal atrial fibrillation (HCC)   Hypercholesterolemia   CAD (coronary artery disease)   DM type 2 (diabetes mellitus, type 2) (HCC)   Chronic systolic CHF (congestive heart failure) ([place])   Essential hypertension   Supratherapeutic INR   Protein-calorie malnutrition, severe   Likely dementia   Acute metabolic acidosis   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HCC)   Bilateral cold feet   Elevated troponin   Palliative care by specialist   Goals of care, counseling/discussion   General weakness   Acute urinary retention   Transaminitis   Renal mass   AKI (acute kidney injury) ([place])   High risk medication use   Agitation     Hospital Course: Mr. Kronk is an 81 y.o. M with Afib on warfarin, dCHF, and DM who presented with many months of progressive weight loss, worsening memory, and generalized functional decline.   In the ER he was found to have acute kidney injury, admitted on fluids.   He was subsequently found to have renal mass, IVC thrombosis versus mass, and persistent delirium.   Urology and palliative care were consulted and in discussion with family, it was clear and generally agreed upon that further work-up with the inevitable surgery required was not the patient's goals of care.   With that in mind and given his persistent delirium he was transitioned to comfort measures, and hospice were consulted.   Due to the patient's waxing and waning delirium, Hospice were unable to  provide appropriate disposition options.  Hospital staff provided palliative measures and basic cares, but on 11/24, the patient took a turn for the worse, becoming more obtunded, uncomfortable appearing.  Given his worsening trajectory, family felt escalation of care was not within his wishes, and comfort medications were provided and Mr. Grzelak passed on 2023/02/17 with many family at the bedside.          Patient is obtunded, pulses thready, breathing shallow, without rales, no abdominal distension, no grimace to palpation of the abdomen, lethargic. Called later by nursing, and found patient unresponsive to touch.  No pulses, no heart sounds, no respirations.        Consultations: Urology, Palliative Care  The results of significant diagnostics from this hospitalization (including imaging, microbiology, ancillary and laboratory) are listed below for reference.   Significant Diagnostic Studies: VAS [place] ABI WITH/WO TBI  Result Date: 01/23/2022  LOWER EXTREMITY DOPPLER STUDY Patient Name:  BALIN VANDEGRIFT  Date of Exam:   01/23/2022 Medical Rec #: [id number]         Accession #:    [id number] Date of Birth: 26-Jan-1941         Patient Gender: M Patient Age:   81 years Exam Location:  [facility] Procedure:      VAS [place] ABI WITH/WO TBI Referring Phys: RALPH NETTEY --------------------------------------------------------------------------------  Indications: Cold feet High Risk Factors: Hypertension, Diabetes.  Limitations: Today's exam was limited due to an open wound, bandages and patient  constant movement. Comparison Study: No prior studies. Performing Technologist: Carlos Levering RVT  Examination Guidelines: A complete evaluation includes at minimum, Doppler waveform signals and systolic blood pressure reading at the level of bilateral brachial, anterior tibial, and posterior tibial arteries, when vessel segments are accessible. Bilateral testing is considered an integral part  of a complete examination. Photoelectric Plethysmograph (PPG) waveforms and toe systolic pressure readings are included as required and additional duplex testing as needed. Limited examinations for reoccurring indications may be performed as noted.  ABI Findings: +--------+------------------+-----+-----------+--------+ Right   Rt Pressure (mmHg)IndexWaveform   Comment  +--------+------------------+-----+-----------+--------+ [id number]                    triphasic           +--------+------------------+-----+-----------+--------+ PTA     254               1.64 multiphasic         +--------+------------------+-----+-----------+--------+ DP      248               1.60 monophasic          +--------+------------------+-----+-----------+--------+ +--------+------------------+-----+----------+--------+ Left    Lt Pressure (mmHg)IndexWaveform  Comment  +--------+------------------+-----+----------+--------+ Brachial                                 Movement +--------+------------------+-----+----------+--------+ PTA     254               1.64 monophasic         +--------+------------------+-----+----------+--------+ DP      254               1.64 monophasic         +--------+------------------+-----+----------+--------+ +-------+-----------+-----------+------------+------------+ ABI/TBIToday's ABIToday's TBIPrevious ABIPrevious TBI +-------+-----------+-----------+------------+------------+ Right  [place]                                             +-------+-----------+-----------+------------+------------+ Left   [place]                                             +-------+-----------+-----------+------------+------------+  Summary: Right: Resting right ankle-brachial index indicates noncompressible right lower extremity arteries. Unable to obtain TBI due to patient constant movement. Left: Resting left ankle-brachial index indicates noncompressible left lower  extremity arteries. Unable to obtain TBI due to patient constant movement. *See table(s) above for measurements and observations.  Electronically signed by Servando Snare MD on 01/23/2022 at 5:32:31 PM.    Final    DG CHEST PORT 1 VIEW  Result Date: 01/21/2022 CLINICAL DATA:  Chest pain. EXAM: PORTABLE CHEST 1 VIEW COMPARISON:  01/22/2022 FINDINGS: Prior median sternotomy and CABG. The heart is normal in size. Stable mediastinal contours, aortic atherosclerosis. Minor bibasilar atelectasis. No acute airspace disease. Normal pulmonary vasculature. No pleural fluid or pneumothorax. No acute osseous findings. IMPRESSION: Minor bibasilar atelectasis. Electronically Signed   By: Keith Rake M.D.   On: 01/21/2022 15:48   CT ABDOMEN PELVIS W CONTRAST  Result Date: 01/20/2022 CLINICAL DATA:  Hematuria. Increased risk of urinary tract malignancy, knee urologic evaluation for urological malignancy. * Tracking Code: BO *. EXAM: CT ABDOMEN AND PELVIS WITH CONTRAST TECHNIQUE: Multidetector CT imaging  of the abdomen and pelvis was performed using the standard protocol following bolus administration of intravenous contrast. RADIATION DOSE REDUCTION: This exam was performed according to the departmental dose-optimization program which includes automated exposure control, adjustment of the mA and/or kV according to patient size and/or use of iterative reconstruction technique. CONTRAST:  41m OMNIPAQUE IOHEXOL 300 MG/ML  SOLN COMPARISON:  Multiple priors including most recent CT January 16, 2022. FINDINGS: Lower chest: Bibasilar atelectasis/scarring. Coronary artery calcifications. Median sternotomy wires. Calcifications aortic valve. Hepatobiliary: Hypoenhancement of the right lobe and medial left lobe of the liver on early postcontrast imaging with equilibration on delayed imaging. No discrete hepatic lesion identified. Gallbladder is unremarkable. No biliary ductal dilation. Pancreas: No pancreatic ductal dilation  or evidence of acute inflammation. Spleen: No splenomegaly or focal splenic lesion. Adrenals/Urinary Tract: Bilateral adrenal glands are within normal limits. No hydronephrosis.  Focal left upper pole caliectasis Hypoenhancing ill-defined masslike area in the left upper pole kidney on image 34/2 demonstrating Hounsfield units of 40 pre contrast administration and 70 postcontrast administration. Slight hypoenhancement of the left kidney in comparison to the right. Nodular heterogeneous enhancement in the left renal pelvis measuring proximally 17 mm on image 44/2. Excreted contrast in the right renal pelvis. Urinary bladder is unremarkable for degree of distension. Stomach/Bowel: Stomach is minimally distended limiting evaluation. Focal wall thickening along the second part of the duodenum. No pathologic dilation of small or large bowel. Colonic diverticulosis without findings of acute diverticulitis. 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 Focal narrowing of the IVC on image 50/6 aortic atherosclerosis. Abdominopelvic collateral vessels are present. Renal veins are not well opacified limiting evaluation. Prominent retroperitoneal lymph nodes for instance a pericaval lymph node measuring 7 mm in short axis on image 26/2 and a portacaval lymph node measuring 5 mm in short axis on image 43/2, stable dating back to July 01, 2020. Reproductive: Prostate is unremarkable. Other: Increased small volume abdominopelvic ascites with mesenteric edema. Nonspecific body wall edema. Musculoskeletal: Multilevel degenerative changes spine. Degenerative change of the bilateral hips. IMPRESSION: 1. Focal narrowing of the mid IVC with fusiform expansion of the intrahepatic IVC with heterogeneous internal enhancement, abdominopelvic collateral vessels are present suggestive of a degree of chronicity, highly suspicious for thrombus either  bland or tumoral. Suggest further evaluation with multiphase CTA with and without contrast. 2. Hypoenhancing ill-defined masslike area in the left upper pole kidney is nonspecific and may reflect a focal area of pyelonephritis with pertinent alternate differential consideration of a papillary type renal cell carcinoma. Correlation with laboratory values and attention on follow-up imaging suggested. 3. Heterogeneous enhancement in the left renal pelvis, may reflect excreted contrast mixing although a urothelial carcinoma could appears similar. Attention on follow-up imaging versus ureteroscopy suggested. 4. Hypoenhancement of the left kidney which is nonspecific in this complex case possibly related to the ill-defined area in the renal pelvis described above, avascular pathology or infection (pyelonephritis. 5. Hypoenhancement of the right lobe and medial left lobe of the liver on early postcontrast imaging with equilibration on delayed imaging, nonspecific but possibly reflecting perfusional changes related to the above IVC pathology. 6. Increased small volume abdominopelvic ascites with mesenteric edema and body wall edema, suggestive of third spacing. 7. Focal wall thickening along the second part of the duodenum, nonspecific but possibly reflecting duodenitis. 8. Colonic diverticulosis without findings of acute diverticulitis. 9. Prominent retroperitoneal lymph nodes, nonspecific but overall stable dating back to July 01, 2020. 10.  Aortic Atherosclerosis (ICD10-I70.0). These results were called by telephone at the time of interpretation on 01/20/2022 at 4:17 pm to provider [facility] , who verbally acknowledged these results. Electronically Signed   By: Dahlia Bailiff M.D.   On: 01/20/2022 16:24   ECHOCARDIOGRAM COMPLETE  Result Date: 01/19/2022    ECHOCARDIOGRAM REPORT   Patient Name:   WILMAN TUCKER Date of Exam: 01/18/2022 Medical Rec #:  [id number]        Height:       69.0 in Accession #:     [id number]       Weight:       175.9 lb Date of Birth:  1940-08-23        BSA:          1.956 m Patient Age:    33 years         BP:           131/91 mmHg Patient Gender: M                HR:           84 bpm. Exam Location:  Inpatient Procedure: 2D Echo Indications:    abnormal ecg  History:        Patient has prior history of Echocardiogram examinations, most                 recent 11/23/2014. CHF, CAD, Arrythmias:Atrial Fibrillation; Risk                 Factors:Dyslipidemia and Hypertension.  Sonographer:    Harvie Junior Referring Phys: [id number] [facility]  Sonographer Comments: Technically difficult study due to poor echo windows. Image acquisition challenging due to uncooperative patient and Image acquisition challenging due to respiratory motion. IMPRESSIONS  1. Poor acoustic windows limit study. Difficult to see endocardium and therefore LVEF Patient also uncooperative. LV function is depressed, probably mildly with mild aneurysmal dilitation of apex and apical akinesis; inferolateral hypokinesis. . Left ventricular diastolic parameters are indeterminate.  2. Right ventricular systolic function was not well visualized. The right ventricular size is not well visualized. There is normal pulmonary artery systolic pressure.  3. No evidence of mitral valve regurgitation.  4. The aortic valve is grossly normal. Aortic valve regurgitation is not visualized. FINDINGS  Left Ventricle: Poor acoustic windows limit study. Difficult to see endocardium and therefore LVEF Patient also uncooperative. LV function is depressed, probably mildly with mild aneurysmal dilitation of apex and apical akinesis; inferolateral hypokinesis. The left ventricular internal cavity size was normal in size. There is no left ventricular hypertrophy. Left ventricular diastolic parameters are indeterminate. Right Ventricle: The right ventricular size is not well visualized. Right vetricular wall thickness was not assessed. Right ventricular  systolic function was not well visualized. There is normal pulmonary artery systolic pressure. The tricuspid regurgitant velocity is 2.32 m/s, and with an assumed right atrial pressure of 3 mmHg, the estimated right ventricular systolic pressure is 61.4 mmHg. Left Atrium: Left atrial size was normal in size. Right Atrium: Right atrial size was normal in size. Pericardium: There is no evidence of pericardial effusion. Mitral Valve: There is mild thickening of the mitral valve leaflet(s). No evidence of mitral valve regurgitation. Tricuspid Valve: The tricuspid valve is grossly normal. Tricuspid valve regurgitation is mild. Aortic Valve: The aortic valve is grossly normal. Aortic valve regurgitation is not visualized. Aortic valve mean gradient measures 2.0 mmHg. Aortic valve peak gradient measures 3.9 mmHg. Aortic valve area, by VTI  measures 1.46 cm. Pulmonic Valve: The pulmonic valve was not well visualized. Pulmonic valve regurgitation is not visualized. Aorta: The aortic root is normal in size and structure. IAS/Shunts: The interatrial septum was not assessed.  LEFT VENTRICLE PLAX 2D LVIDd:         3.70 cm   Diastology LVIDs:         2.50 cm   LV e' medial:  4.90 cm/s LV PW:         0.90 cm   LV e' lateral: 5.33 cm/s LV IVS:        0.90 cm LVOT diam:     2.00 cm LV SV:         22 LV SV Index:   11 LVOT Area:     3.14 cm  RIGHT VENTRICLE RV S prime:     9.03 cm/s LEFT ATRIUM         Index LA diam:    2.60 cm 1.33 cm/m  AORTIC VALVE                    PULMONIC VALVE AV Area (Vmax):    1.68 cm     PV Vmax:       1.03 m/s AV Area (Vmean):   1.69 cm     PV Peak grad:  4.2 mmHg AV Area (VTI):     1.46 cm AV Vmax:           99.10 cm/s AV Vmean:          63.800 cm/s AV VTI:            0.152 m AV Peak Grad:      3.9 mmHg AV Mean Grad:      2.0 mmHg LVOT Vmax:         53.10 cm/s LVOT Vmean:        34.300 cm/s LVOT VTI:          0.071 m LVOT/AV VTI ratio: 0.46  AORTA Ao Root diam: 3.80 cm TRICUSPID VALVE TR Peak grad:    21.5 mmHg TR Vmax:        232.00 cm/s  SHUNTS Systemic VTI:  0.07 m Systemic Diam: 2.00 cm Dorris Carnes MD Electronically signed by Dorris Carnes MD Signature Date/Time: 01/19/2022/11:07:30 AM    Final    US RENAL  Result Date: 01/19/2022 CLINICAL DATA:  Abnormality on CT EXAM: RENAL / URINARY TRACT ULTRASOUND COMPLETE COMPARISON:  None Available. FINDINGS: Right Kidney: Renal measurements: 10.7 x 5.0 x 6.3 cm = volume: 176.2 mL. Echogenicity within normal limits. No mass or hydronephrosis visualized. Left Kidney: Renal measurements: 11.3 x 5.2 x 5.7 cm = volume: 177.6 mL. Echogenicity within normal limits. Lobulated contour. Tiny simple cyst of the lower pole of the left kidney measuring 8 mm. No mass or hydronephrosis visualized. Bladder: Appears normal for degree of bladder distention. Other: None. IMPRESSION: 1. No hydronephrosis. 2. Lobulated contour of the left kidney with no evidence of mass. Electronically Signed   By: Yetta Glassman M.D.   On: 01/23/2022 20:04   CT ABDOMEN PELVIS WO CONTRAST  Result Date: 01/21/2022 CLINICAL DATA:  Gross hematuriaFailure to thrive EXAM:.: EXAM:. CT ABDOMEN AND PELVIS WITHOUT CONTRAST TECHNIQUE: Multidetector CT imaging of the abdomen and pelvis was performed following the standard protocol without IV contrast. RADIATION DOSE REDUCTION: This exam was performed according to the departmental dose-optimization program which includes automated exposure control, adjustment of the mA and/or kV according to patient size and/or  use of iterative reconstruction technique. COMPARISON:  None Available. FINDINGS: Lower chest: Lung bases are clear. Hepatobiliary: No focal hepatic lesion. No biliary duct dilatation. Common bile duct is normal. Pancreas: Pancreas is normal. No ductal dilatation. No pancreatic inflammation. Spleen: Normal spleen Adrenals/urinary tract: Adrenal glands normal. No nephrolithiasis or ureterolithiasis. No obstructive uropathy. No bladder calculi On  sagittal imaging 18 there is a lobular contour to the upper pole of the LEFT kidney measuring 2.4 x 2.2 cm (image 142/6. Stomach/Bowel: Stomach, small bowel, appendix, and cecum are normal. The colon and rectosigmoid colon are normal. Vascular/Lymphatic: Abdominal aorta is normal caliber with atherosclerotic calcification. There is no retroperitoneal or periportal lymphadenopathy. No pelvic lymphadenopathy. Reproductive: Prostate unremarkable Other: No free fluid. Musculoskeletal: No aggressive osseous lesion. Degenerative osteophytosis of the spine. IMPRESSION: 1. No nephrolithiasis ureterolithiasis or obstructive uropathy. 2. No bladder calculi. 3. Lobular contour to the upper pole of the LEFT kidney. Recommend renal ultrasound (or MRI) to exclude a LEFT renal mass. 4.  Aortic Atherosclerosis (ICD10-I70.0). 5. Electronically Signed   By: Suzy Bouchard M.D.   On: 01/21/2022 18:20   CT Head Wo Contrast  Result Date: 01/15/2022 CLINICAL DATA:  Provided history: Neuro deficit, acute, stroke suspected. Additional history provided: Possible dehydration, failure to thrive, increased weakness over the past month. EXAM: CT HEAD WITHOUT CONTRAST TECHNIQUE: Contiguous axial images were obtained from the base of the skull through the vertex without intravenous contrast. RADIATION DOSE REDUCTION: This exam was performed according to the departmental dose-optimization program which includes automated exposure control, adjustment of the mA and/or kV according to patient size and/or use of iterative reconstruction technique. COMPARISON:  Head CT 08/30/2021. FINDINGS: Brain: Frontoparietal predominant cerebral atrophy. Commensurate prominence of the ventricles and sulci. Unchanged asymmetric CSF prominence posterior to the left cerebellar hemisphere (measuring up to 10 mm in thickness). 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 There is no acute intracranial hemorrhage. No demarcated cortical infarct. No  evidence of an intracranial mass. No midline shift. Vascular: No hyperdense vessel. Atherosclerotic calcifications. Skull: No fracture or aggressive osseous lesion. Sinuses/Orbits: No mass or acute finding within the imaged orbits. Minimal mucosal thickening within right ethmoid air cells. IMPRESSION: 1. No evidence of acute intracranial abnormality. 2. 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 3. Frontoparietal predominant cerebral atrophy. Electronically Signed   By: Kellie Simmering D.O.   On: 01/08/2022 14:04   DG Chest Port 1 View  Result Date: 01/08/2022 CLINICAL DATA:  Weakness. EXAM: PORTABLE CHEST 1 VIEW COMPARISON:  Chest radiographs 07/14/2021 FINDINGS: Sequelae of CABG are again identified. The cardiomediastinal silhouette is unchanged with normal heart size. Lung volumes are decreased. No airspace consolidation, edema, pleural effusion, or pneumothorax is identified. No acute osseous abnormality is seen. IMPRESSION: No active disease. Electronically Signed   By: Logan Bores M.D.   On: 01/20/2022 14:01    Microbiology: No results found for this or any previous visit (from the past 240 hour(s)).  Time spent: 25 minutes  Signed: Edwin Dada, MD 2022/02/25

## 2022-02-03 DEATH — deceased
